# Patient Record
Sex: Female | Born: 1955 | Race: White | Hispanic: No | Marital: Married | Smoking: Never smoker
Health system: Southern US, Community
[De-identification: ages and names within clinical notes are randomized; demographics above are authoritative.]

## PROBLEM LIST (undated history)

## (undated) DIAGNOSIS — M1990 Unspecified osteoarthritis, unspecified site: Secondary | ICD-10-CM

## (undated) DIAGNOSIS — F419 Anxiety disorder, unspecified: Secondary | ICD-10-CM

## (undated) DIAGNOSIS — E039 Hypothyroidism, unspecified: Secondary | ICD-10-CM

## (undated) DIAGNOSIS — C73 Malignant neoplasm of thyroid gland: Secondary | ICD-10-CM

## (undated) DIAGNOSIS — R011 Cardiac murmur, unspecified: Secondary | ICD-10-CM

## (undated) HISTORY — PX: ABDOMINAL HYSTERECTOMY: SHX81

## (undated) HISTORY — PX: REDUCTION MAMMAPLASTY: SUR839

## (undated) HISTORY — PX: APPENDECTOMY: SHX54

## (undated) HISTORY — PX: TONSILLECTOMY: SUR1361

## (undated) HISTORY — PX: CHOLECYSTECTOMY: SHX55

---

## 1991-07-12 HISTORY — PX: TOTAL THYROIDECTOMY: SHX2547

## 1999-04-22 ENCOUNTER — Other Ambulatory Visit: Admission: RE | Admit: 1999-04-22 | Discharge: 1999-04-22 | Payer: Self-pay | Admitting: Gynecology

## 1999-10-01 ENCOUNTER — Encounter (INDEPENDENT_AMBULATORY_CARE_PROVIDER_SITE_OTHER): Payer: Self-pay | Admitting: Specialist

## 1999-10-01 ENCOUNTER — Other Ambulatory Visit: Admission: RE | Admit: 1999-10-01 | Discharge: 1999-10-01 | Payer: Self-pay | Admitting: Gynecology

## 2001-01-09 ENCOUNTER — Other Ambulatory Visit: Admission: RE | Admit: 2001-01-09 | Discharge: 2001-01-09 | Payer: Self-pay | Admitting: Gynecology

## 2002-02-05 ENCOUNTER — Other Ambulatory Visit: Admission: RE | Admit: 2002-02-05 | Discharge: 2002-02-05 | Payer: Self-pay | Admitting: Gynecology

## 2004-11-05 LAB — HM DEXA SCAN

## 2005-03-02 ENCOUNTER — Ambulatory Visit: Payer: Self-pay | Admitting: Chiropractic Medicine

## 2005-09-22 ENCOUNTER — Ambulatory Visit: Payer: Self-pay | Admitting: Dermatology

## 2005-12-12 LAB — HM PAP SMEAR: HM PAP: NEGATIVE

## 2006-05-05 ENCOUNTER — Ambulatory Visit: Payer: Self-pay | Admitting: Endocrinology

## 2006-12-11 ENCOUNTER — Encounter (HOSPITAL_COMMUNITY): Admission: RE | Admit: 2006-12-11 | Discharge: 2006-12-15 | Payer: Self-pay | Admitting: Endocrinology

## 2007-03-02 ENCOUNTER — Ambulatory Visit (HOSPITAL_COMMUNITY): Admission: RE | Admit: 2007-03-02 | Discharge: 2007-03-02 | Payer: Self-pay | Admitting: Family Medicine

## 2007-04-24 ENCOUNTER — Encounter: Payer: Self-pay | Admitting: *Deleted

## 2007-04-24 DIAGNOSIS — Z9079 Acquired absence of other genital organ(s): Secondary | ICD-10-CM | POA: Insufficient documentation

## 2007-04-24 DIAGNOSIS — F411 Generalized anxiety disorder: Secondary | ICD-10-CM | POA: Insufficient documentation

## 2007-04-24 DIAGNOSIS — D649 Anemia, unspecified: Secondary | ICD-10-CM

## 2007-04-24 DIAGNOSIS — D72819 Decreased white blood cell count, unspecified: Secondary | ICD-10-CM | POA: Insufficient documentation

## 2007-04-24 DIAGNOSIS — Z9089 Acquired absence of other organs: Secondary | ICD-10-CM

## 2007-04-24 DIAGNOSIS — Z872 Personal history of diseases of the skin and subcutaneous tissue: Secondary | ICD-10-CM | POA: Insufficient documentation

## 2007-04-24 DIAGNOSIS — Z85850 Personal history of malignant neoplasm of thyroid: Secondary | ICD-10-CM

## 2009-03-03 ENCOUNTER — Ambulatory Visit: Payer: Self-pay | Admitting: Internal Medicine

## 2009-03-18 ENCOUNTER — Ambulatory Visit: Payer: Self-pay | Admitting: Internal Medicine

## 2009-03-18 LAB — HM COLONOSCOPY

## 2009-05-19 ENCOUNTER — Emergency Department: Payer: Self-pay | Admitting: Unknown Physician Specialty

## 2010-11-26 NOTE — Consult Note (Signed)
[facility]                            ENDOCRINOLOGY CONSULTATION   Emily Arellano, Emily Arellano                       MRN:          [id number]  DATE:05/05/2006                            DOB:          1955/07/28    REASON FOR VISIT:  Check thyroid.   HISTORY OF PRESENT ILLNESS:  A 55 year old woman with a history of what she  calls mixed capillary and follicular thyroid cancer in 1994.  She states she  had adjunctive therapy with iodine-131.  She states there has been no recent  evidence of any recurrence.   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   She states that in her 46 thyroid surgery, 2 parathyroids were removed.   She was also found in January 2007 to have idiopathic urticaria and she  finds that only Dapsone helps this.  She has gained 15 pounds in the past 3  months and has a few arthralgias as well as some constipation.  She states  the only thing that helps her constipation is Dulcolax or MiraLax.   PAST MEDICAL HISTORY:  Anxiety.   MEDICATIONS:  Synthroid compound hormone, Dapsone, Zantac, Diclofenac,  Zoloft, Singulair, Doxepin, and Xanax.  She also takes p.r.n. Atarax.   SOCIAL HISTORY:  She is married.  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   FAMILY HISTORY:  Her mother has a history of psoriasis.  There is no history  of thyroid disease in her family.   REVIEW OF SYSTEMS:  Denies the following:  Fever, chest pain, shortness of  breath, hematuria, rectal bleeding and numbness.   PHYSICAL EXAMINATION:  Blood pressure 104/70, heart rate is 69, temperature  is 97.6, weight is 158.  GENERAL:  No distress.  Skin:  Normal texture and temperature.  I don't see  a rash.  HEENT:  No proptosis, no periorbital swelling.  Pharynx: No erythema.  NECK:  She has a healed surgical scar.  No palpable thyroid tissue.  EXTREMITIES:  No edema.  LYMPH NODES:   None palpable at the neck, nor at the supraclavicular area.  MUSCULOSKELETAL:  Gait is observed in the office to be normal.  NEUROLOGIC:  Alert and oriented, does not appear anxious or depressed, and  sensation is diffusely intact to touch.   LABORATORY STUDIES:  On April 05, 2006:  WBC 3,200, hemoglobin 10.6,  platelets 269.  Liver function tests normal except for GOT of 40.  TSH:  0.17.   IMPRESSION:  1. 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  2. Minimally elevated GOT.  3. In a patient with a very low risk of recurrence of thyroid cancer, she      would be considered over supplemented with her Synthroid.  If there is      any evidence or risk of recurrence, this would be appropriate      supplementation.  4. History of parathyroidectomy of 2 parathyroids during her 1994 surgery.  5. History of thyroid cancer for which she has apparently not recently  been checked.  6. Mild leukopenia and anemia.   PLAN:  1. I have told her that continuing the Dapsone is fine with me.  2. Please make an appointment with Dr. Sullivan Lone to address the abnormal CBC      and abnormal GOT.  3. Check parathyroid hormone level, thyroglobulin and anti-thyroglobulin      antibody.  4. Okay to return here p.r.n.    ______________________________  Cleophas Dunker. Everardo All, MD    SAE/MedQ  DD: 05/07/2006  DT: 05/08/2006  Job #: [id number]   cc:   Julieanne Manson

## 2011-09-21 DIAGNOSIS — M66879 Spontaneous rupture of other tendons, unspecified ankle and foot: Secondary | ICD-10-CM | POA: Insufficient documentation

## 2011-09-21 DIAGNOSIS — M2140 Flat foot [pes planus] (acquired), unspecified foot: Secondary | ICD-10-CM | POA: Insufficient documentation

## 2011-10-10 HISTORY — PX: TENDON REPAIR: SHX5111

## 2012-02-06 ENCOUNTER — Encounter (HOSPITAL_COMMUNITY): Payer: Self-pay | Admitting: Pharmacy Technician

## 2012-02-13 ENCOUNTER — Other Ambulatory Visit: Payer: Self-pay | Admitting: Orthopedic Surgery

## 2012-02-16 ENCOUNTER — Encounter (HOSPITAL_COMMUNITY)
Admission: RE | Admit: 2012-02-16 | Discharge: 2012-02-16 | Disposition: A | Discharge status: Home / Self Care | Payer: BC Managed Care – PPO | Source: Ambulatory Visit | Attending: Orthopedic Surgery | Admitting: Orthopedic Surgery

## 2012-02-16 ENCOUNTER — Encounter (HOSPITAL_COMMUNITY): Payer: Self-pay

## 2012-02-16 HISTORY — DX: Cardiac murmur, unspecified: R01.1

## 2012-02-16 HISTORY — DX: Hypothyroidism, unspecified: E03.9

## 2012-02-16 LAB — CBC
MCH: 28.8 pg (ref 26.0–34.0)
MCHC: 32.9 g/dL (ref 30.0–36.0)
Platelets: 231 10*3/uL (ref 150–400)
RBC: 4.55 MIL/uL (ref 3.87–5.11)

## 2012-02-16 LAB — BASIC METABOLIC PANEL
BUN: 13 mg/dL (ref 6–23)
Calcium: 8.8 mg/dL (ref 8.4–10.5)
GFR calc non Af Amer: 77 mL/min — ABNORMAL LOW (ref >90)
Glucose, Bld: 99 mg/dL (ref 70–99)
Sodium: 139 mEq/L (ref 135–145)

## 2012-02-16 LAB — SURGICAL PCR SCREEN: MRSA, PCR: NEGATIVE

## 2012-02-16 NOTE — Pre-Procedure Instructions (Signed)
20 Emily Arellano  02/16/2012   Your procedure is scheduled on:  Thursday August 15 ,2013 at 1300 PM  Report to [facility] at 1100 AM.  Call this number if you have problems the morning of surgery: [phone]   Remember:   Do not eat food or drink:After Midnight.      Take these medicines the morning of surgery with A SIP OF WATER: Xanax[ Alprazolam] , Synthroid   Do not wear jewelry, make-up or nail polish.  Do not wear lotions, powders, or perfumes. You may wear deodorant.  Do not shave 48 hours prior to surgery.  Do not bring valuables to the hospital.  Contacts, dentures or bridgework may not be worn into surgery.  Leave suitcase in the car. After surgery it may be brought to your room.  For patients admitted to the hospital, checkout time is 11:00 AM the day of discharge.   Patients discharged the day of surgery will not be allowed to drive home.    Special Instructions: CHG Shower Use Special Wash: 1/2 bottle night before surgery and 1/2 bottle morning of surgery.   Please read over the following fact sheets that you were given: Pain Booklet, Coughing and Deep Breathing, MRSA Information and Surgical Site Infection Prevention

## 2012-02-17 ENCOUNTER — Other Ambulatory Visit: Payer: Self-pay | Admitting: Orthopedic Surgery

## 2012-02-17 ENCOUNTER — Ambulatory Visit
Admission: RE | Admit: 2012-02-17 | Discharge: 2012-02-17 | Disposition: A | Discharge status: Home / Self Care | Payer: BC Managed Care – PPO | Source: Ambulatory Visit | Attending: Orthopedic Surgery | Admitting: Orthopedic Surgery

## 2012-02-17 DIAGNOSIS — R911 Solitary pulmonary nodule: Secondary | ICD-10-CM

## 2012-02-17 DIAGNOSIS — S42309A Unspecified fracture of shaft of humerus, unspecified arm, initial encounter for closed fracture: Secondary | ICD-10-CM

## 2012-02-17 NOTE — Consult Note (Signed)
Anesthesia chart review: Patient is a 56 year old female scheduled for ORIF left humerus nonunion with iliac crest and bone morphogenic protein bone graft and fixation as necessary, left radial nerve exploration and decompression as needed on 02/23/12 by Dr. Amanda Pea.  History includes thyroid cancer s/p thyroidectomy and iodine- 131 therapy '94 with secondary hypothyroidism, murmur (not specified), OA, tonsillectomy, hysterectomy, cholecystectomy, foot surgery  CXR on 02/16/12 showed: 1. No acute cardiopulmonary disease.  2. Nodular density at the medial right lung base on the frontal  view measuring about 15 mm. Pulmonary nodule is not excluded and follow-up noncontrast chest CT recommended.  3. Nonunion left humerus. She subsequently had a CT scan without contrast on 02/17/12 that showed: 1. No acute cardiopulmonary abnormalities.  2. No suspicious nodule or mass noted.   Labs acceptable.  EKG on 02/16/12 showed NSR.  She will be evaluated by her Anesthesiologist on the day of surgery.  If no worrisome cardiopulmonary exam findings or CV/CHF symptoms then anticipate she can proceed as planned.   Shonna Chock, PA-C

## 2012-02-22 MED ORDER — CEFAZOLIN SODIUM-DEXTROSE 2-3 GM-% IV SOLR
2.0000 g | INTRAVENOUS | Status: AC
  Administered 2012-02-23: 2 g via INTRAVENOUS
  Filled 2012-02-22: qty 50

## 2012-02-22 MED ORDER — LACTATED RINGERS IV SOLN: INTRAVENOUS | Status: DC

## 2012-02-22 MED ORDER — CHLORHEXIDINE GLUCONATE 4 % EX LIQD: 60.0000 mL | Freq: Once | CUTANEOUS | Status: DC

## 2012-02-22 NOTE — Progress Notes (Signed)
Second request made to office for orders. Dr. Biagio Quint out of town will not return until Friday morning.

## 2012-02-23 ENCOUNTER — Encounter (HOSPITAL_COMMUNITY): Payer: Self-pay | Admitting: Vascular Surgery

## 2012-02-23 ENCOUNTER — Encounter (HOSPITAL_COMMUNITY)
Admission: RE | Disposition: A | Discharge status: Home / Self Care | Payer: Self-pay | Source: Ambulatory Visit | Attending: Orthopedic Surgery

## 2012-02-23 ENCOUNTER — Observation Stay (HOSPITAL_COMMUNITY)
Admission: RE | Admit: 2012-02-23 | Discharge: 2012-02-25 | DRG: 219 | Disposition: A | Discharge status: Home / Self Care | Payer: BC Managed Care – PPO | Source: Ambulatory Visit | Attending: Orthopedic Surgery | Admitting: Orthopedic Surgery

## 2012-02-23 ENCOUNTER — Encounter (HOSPITAL_COMMUNITY): Payer: Self-pay | Admitting: *Deleted

## 2012-02-23 ENCOUNTER — Ambulatory Visit (HOSPITAL_COMMUNITY): Payer: BC Managed Care – PPO | Admitting: Vascular Surgery

## 2012-02-23 DIAGNOSIS — D649 Anemia, unspecified: Secondary | ICD-10-CM

## 2012-02-23 DIAGNOSIS — Z01810 Encounter for preprocedural cardiovascular examination: Secondary | ICD-10-CM | POA: Insufficient documentation

## 2012-02-23 DIAGNOSIS — Z01818 Encounter for other preprocedural examination: Secondary | ICD-10-CM | POA: Insufficient documentation

## 2012-02-23 DIAGNOSIS — Z01812 Encounter for preprocedural laboratory examination: Secondary | ICD-10-CM | POA: Insufficient documentation

## 2012-02-23 DIAGNOSIS — E039 Hypothyroidism, unspecified: Secondary | ICD-10-CM | POA: Insufficient documentation

## 2012-02-23 DIAGNOSIS — IMO0002 Reserved for concepts with insufficient information to code with codable children: Principal | ICD-10-CM | POA: Insufficient documentation

## 2012-02-23 HISTORY — PX: ORIF HUMERUS FRACTURE: SHX2126

## 2012-02-23 HISTORY — DX: Anxiety disorder, unspecified: F41.9

## 2012-02-23 HISTORY — PX: HARVEST BONE GRAFT: SHX377

## 2012-02-23 HISTORY — DX: Unspecified osteoarthritis, unspecified site: M19.90

## 2012-02-23 HISTORY — DX: Malignant neoplasm of thyroid gland: C73

## 2012-02-23 SURGERY — OPEN REDUCTION INTERNAL FIXATION (ORIF) DISTAL HUMERUS FRACTURE
Anesthesia: General | Site: Hip | Wound class: Clean

## 2012-02-23 MED ORDER — FENTANYL CITRATE 0.05 MG/ML IJ SOLN
INTRAMUSCULAR | Status: DC | PRN
  Administered 2012-02-23 (×3): 50 ug via INTRAVENOUS
  Administered 2012-02-23: 100 ug via INTRAVENOUS

## 2012-02-23 MED ORDER — BUPIVACAINE-EPINEPHRINE PF 0.5-1:200000 % IJ SOLN
INTRAMUSCULAR | Status: DC | PRN
  Administered 2012-02-23: 25 mL

## 2012-02-23 MED ORDER — LEVOTHYROXINE SODIUM 88 MCG PO TABS
88.0000 ug | ORAL_TABLET | Freq: Every day | ORAL | Status: DC
  Administered 2012-02-23 – 2012-02-25 (×3): 88 ug via ORAL
  Filled 2012-02-23 (×3): qty 1

## 2012-02-23 MED ORDER — PROMETHAZINE HCL 25 MG RE SUPP: 12.5000 mg | Freq: Four times a day (QID) | RECTAL | Status: DC | PRN

## 2012-02-23 MED ORDER — ALPRAZOLAM 0.5 MG PO TABS
1.0000 mg | ORAL_TABLET | Freq: Every day | ORAL | Status: DC
  Administered 2012-02-24: 1 mg via ORAL
  Filled 2012-02-23: qty 2

## 2012-02-23 MED ORDER — FAMOTIDINE 20 MG PO TABS
20.0000 mg | ORAL_TABLET | Freq: Two times a day (BID) | ORAL | Status: DC | PRN
Start: 2012-02-23 — End: 2012-02-25
  Filled 2012-02-23: qty 1

## 2012-02-23 MED ORDER — LACTATED RINGERS IV SOLN
INTRAVENOUS | Status: DC
  Administered 2012-02-23 – 2012-02-24 (×2): via INTRAVENOUS

## 2012-02-23 MED ORDER — VITAMIN C 500 MG PO TABS
1000.0000 mg | ORAL_TABLET | Freq: Every day | ORAL | Status: DC
  Administered 2012-02-23 – 2012-02-25 (×3): 1000 mg via ORAL
  Filled 2012-02-23 (×3): qty 2

## 2012-02-23 MED ORDER — ONDANSETRON HCL 4 MG PO TABS: 4.0000 mg | ORAL_TABLET | Freq: Four times a day (QID) | ORAL | Status: DC | PRN

## 2012-02-23 MED ORDER — HYDROMORPHONE HCL PF 1 MG/ML IJ SOLN
INTRAMUSCULAR | Status: AC
  Administered 2012-02-23: 0.5 mg via INTRAVENOUS
  Filled 2012-02-23: qty 1

## 2012-02-23 MED ORDER — PROPOFOL 10 MG/ML IV EMUL
INTRAVENOUS | Status: DC | PRN
  Administered 2012-02-23: 160 mg via INTRAVENOUS

## 2012-02-23 MED ORDER — DOCUSATE SODIUM 100 MG PO CAPS
100.0000 mg | ORAL_CAPSULE | Freq: Two times a day (BID) | ORAL | Status: DC
  Administered 2012-02-23 – 2012-02-25 (×4): 100 mg via ORAL
  Filled 2012-02-23 (×6): qty 1

## 2012-02-23 MED ORDER — HYDROMORPHONE HCL PF 1 MG/ML IJ SOLN
0.2500 mg | INTRAMUSCULAR | Status: DC | PRN
  Administered 2012-02-23 (×3): 0.5 mg via INTRAVENOUS

## 2012-02-23 MED ORDER — DEXAMETHASONE SODIUM PHOSPHATE 4 MG/ML IJ SOLN
INTRAMUSCULAR | Status: DC | PRN
  Administered 2012-02-23: 4 mg

## 2012-02-23 MED ORDER — LACTATED RINGERS IV SOLN
INTRAVENOUS | Status: DC | PRN
  Administered 2012-02-23 (×2): via INTRAVENOUS

## 2012-02-23 MED ORDER — EPINEPHRINE HCL 1 MG/ML IJ SOLN
INTRAMUSCULAR | Status: AC
  Filled 2012-02-23: qty 2

## 2012-02-23 MED ORDER — MIDAZOLAM HCL 5 MG/5ML IJ SOLN
INTRAMUSCULAR | Status: DC | PRN
  Administered 2012-02-23: 2 mg via INTRAVENOUS

## 2012-02-23 MED ORDER — DIPHENHYDRAMINE HCL 25 MG PO CAPS
25.0000 mg | ORAL_CAPSULE | Freq: Four times a day (QID) | ORAL | Status: DC | PRN
  Administered 2012-02-23 – 2012-02-24 (×2): 50 mg via ORAL
  Filled 2012-02-23: qty 2
  Filled 2012-02-23 (×2): qty 1

## 2012-02-23 MED ORDER — BUPIVACAINE HCL 0.25 % IJ SOLN
INTRAMUSCULAR | Status: DC | PRN
  Administered 2012-02-23: 10 mL

## 2012-02-23 MED ORDER — EPINEPHRINE HCL 1 MG/ML IJ SOLN
INTRAMUSCULAR | Status: DC | PRN
  Administered 2012-02-23: 1 mg

## 2012-02-23 MED ORDER — METHOCARBAMOL 100 MG/ML IJ SOLN
500.0000 mg | Freq: Four times a day (QID) | INTRAVENOUS | Status: DC | PRN
  Filled 2012-02-23: qty 5

## 2012-02-23 MED ORDER — ONDANSETRON HCL 4 MG/2ML IJ SOLN
INTRAMUSCULAR | Status: DC | PRN
  Administered 2012-02-23: 4 mg via INTRAVENOUS

## 2012-02-23 MED ORDER — ONDANSETRON HCL 4 MG/2ML IJ SOLN: 4.0000 mg | Freq: Four times a day (QID) | INTRAMUSCULAR | Status: DC | PRN

## 2012-02-23 MED ORDER — GLYCOPYRROLATE 0.2 MG/ML IJ SOLN
INTRAMUSCULAR | Status: DC | PRN
  Administered 2012-02-23: .5 mg via INTRAVENOUS

## 2012-02-23 MED ORDER — METHOCARBAMOL 500 MG PO TABS
500.0000 mg | ORAL_TABLET | Freq: Four times a day (QID) | ORAL | Status: DC | PRN
  Administered 2012-02-23 – 2012-02-25 (×5): 500 mg via ORAL
  Filled 2012-02-23 (×6): qty 1

## 2012-02-23 MED ORDER — ARTIFICIAL TEARS OP OINT
TOPICAL_OINTMENT | OPHTHALMIC | Status: DC | PRN
  Administered 2012-02-23: 1 via OPHTHALMIC

## 2012-02-23 MED ORDER — MIDAZOLAM HCL 2 MG/2ML IJ SOLN: 1.0000 mg | INTRAMUSCULAR | Status: DC | PRN

## 2012-02-23 MED ORDER — LIDOCAINE HCL (CARDIAC) 20 MG/ML IV SOLN
INTRAVENOUS | Status: DC | PRN
  Administered 2012-02-23: 80 mg via INTRAVENOUS

## 2012-02-23 MED ORDER — HEMOSTATIC AGENTS (NO CHARGE) OPTIME
TOPICAL | Status: DC | PRN
  Administered 2012-02-23: 1 via TOPICAL

## 2012-02-23 MED ORDER — FENTANYL CITRATE 0.05 MG/ML IJ SOLN: 50.0000 ug | INTRAMUSCULAR | Status: DC | PRN

## 2012-02-23 MED ORDER — ROCURONIUM BROMIDE 100 MG/10ML IV SOLN
INTRAVENOUS | Status: DC | PRN
  Administered 2012-02-23: 50 mg via INTRAVENOUS

## 2012-02-23 MED ORDER — TAPENTADOL HCL 50 MG PO TABS
50.0000 mg | ORAL_TABLET | ORAL | Status: DC | PRN
  Administered 2012-02-24 – 2012-02-25 (×6): 50 mg via ORAL
  Filled 2012-02-23 (×6): qty 1

## 2012-02-23 MED ORDER — ADULT MULTIVITAMIN W/MINERALS CH
1.0000 | ORAL_TABLET | Freq: Every day | ORAL | Status: DC
  Administered 2012-02-23 – 2012-02-25 (×3): 1 via ORAL
  Filled 2012-02-23 (×3): qty 1

## 2012-02-23 MED ORDER — BUPIVACAINE HCL (PF) 0.25 % IJ SOLN
INTRAMUSCULAR | Status: AC
  Filled 2012-02-23: qty 30

## 2012-02-23 MED ORDER — PROMETHAZINE HCL 25 MG/ML IJ SOLN: 6.2500 mg | INTRAMUSCULAR | Status: DC | PRN

## 2012-02-23 MED ORDER — NEOSTIGMINE METHYLSULFATE 1 MG/ML IJ SOLN
INTRAMUSCULAR | Status: DC | PRN
  Administered 2012-02-23: 3 mg via INTRAVENOUS

## 2012-02-23 MED ORDER — HYDROMORPHONE HCL PF 1 MG/ML IJ SOLN
0.5000 mg | INTRAMUSCULAR | Status: DC | PRN
  Administered 2012-02-23 – 2012-02-24 (×4): 1 mg via INTRAVENOUS
  Filled 2012-02-23 (×4): qty 1

## 2012-02-23 SURGICAL SUPPLY — 59 items
BANDAGE ELASTIC 3 VELCRO ST LF (GAUZE/BANDAGES/DRESSINGS) ×1 IMPLANT
BANDAGE ELASTIC 4 VELCRO ST LF (GAUZE/BANDAGES/DRESSINGS) ×3 IMPLANT
BANDAGE GAUZE ELAST BULKY 4 IN (GAUZE/BANDAGES/DRESSINGS) IMPLANT
BIT DRILL 3.2 QC DISP (BIT) ×1 IMPLANT
BIT DRILL 3.8X127 CALB (DRILL) ×1 IMPLANT
BNDG CMPR 9X4 STRL LF SNTH (GAUZE/BANDAGES/DRESSINGS) ×2
BNDG ESMARK 4X9 LF (GAUZE/BANDAGES/DRESSINGS) ×3 IMPLANT
CLOTH BEACON ORANGE TIMEOUT ST (SAFETY) ×3 IMPLANT
CLSR STERI-STRIP ANTIMIC 1/2X4 (GAUZE/BANDAGES/DRESSINGS) ×2 IMPLANT
CORDS BIPOLAR (ELECTRODE) ×3 IMPLANT
COVER MAYO STAND STRL (DRAPES) ×3 IMPLANT
COVER SURGICAL LIGHT HANDLE (MISCELLANEOUS) ×3 IMPLANT
CUFF TOURNIQUET SINGLE 18IN (TOURNIQUET CUFF) ×3 IMPLANT
CUFF TOURNIQUET SINGLE 24IN (TOURNIQUET CUFF) IMPLANT
DRAPE INCISE IOBAN 66X45 STRL (DRAPES) ×3 IMPLANT
DRAPE OEC MINIVIEW 54X84 (DRAPES) ×1 IMPLANT
DRSG MEPILEX BORDER 4X12 (GAUZE/BANDAGES/DRESSINGS) ×1 IMPLANT
DRSG MEPILEX BORDER 4X8 (GAUZE/BANDAGES/DRESSINGS) ×1 IMPLANT
GAUZE XEROFORM 1X8 LF (GAUZE/BANDAGES/DRESSINGS) IMPLANT
GLOVE BIOGEL M STRL SZ7.5 (GLOVE) ×3 IMPLANT
GLOVE SS BIOGEL STRL SZ 8 (GLOVE) ×2 IMPLANT
GLOVE SUPERSENSE BIOGEL SZ 8 (GLOVE) ×1
GOWN PREVENTION PLUS XLARGE (GOWN DISPOSABLE) ×2 IMPLANT
GOWN STRL NON-REIN LRG LVL3 (GOWN DISPOSABLE) ×6 IMPLANT
GOWN STRL REIN XL XLG (GOWN DISPOSABLE) ×8 IMPLANT
IMPLANT OP-1 (Orthopedic Implant) ×1 IMPLANT
KIT BASIN OR (CUSTOM PROCEDURE TRAY) ×3 IMPLANT
KIT ROOM TURNOVER OR (KITS) ×3 IMPLANT
MANIFOLD NEPTUNE II (INSTRUMENTS) ×3 IMPLANT
NDL HYPO 25GX1X1/2 BEV (NEEDLE) IMPLANT
NEEDLE HYPO 25GX1X1/2 BEV (NEEDLE) ×3 IMPLANT
NS IRRIG 1000ML POUR BTL (IV SOLUTION) ×3 IMPLANT
PACK ORTHO EXTREMITY (CUSTOM PROCEDURE TRAY) ×3 IMPLANT
PAD ARMBOARD 7.5X6 YLW CONV (MISCELLANEOUS) ×6 IMPLANT
PAD CAST 3X4 CTTN HI CHSV (CAST SUPPLIES) IMPLANT
PAD CAST 4YDX4 CTTN HI CHSV (CAST SUPPLIES) IMPLANT
PADDING CAST COTTON 3X4 STRL (CAST SUPPLIES) ×3
PADDING CAST COTTON 4X4 STRL (CAST SUPPLIES) ×3
PLATE LOCK COMP 8H 4.5 (Plate) ×1 IMPLANT
SCREW LOCK CORT HEX 4.5X18 (Screw) ×1 IMPLANT
SCREW LOCK CORT HEX 4.5X22 (Screw) ×2 IMPLANT
SCREW LOCK CORT HEX 4.5X24 (Screw) ×1 IMPLANT
SCREW NLOCK CORT 4.5X26 (Screw) ×2 IMPLANT
SOLUTION BETADINE 4OZ (MISCELLANEOUS) ×3 IMPLANT
SPLINT FIBERGLASS 4X30 (CAST SUPPLIES) ×1 IMPLANT
SPONGE GAUZE 4X4 12PLY (GAUZE/BANDAGES/DRESSINGS) IMPLANT
SPONGE LAP 18X18 X RAY DECT (DISPOSABLE) ×2 IMPLANT
SPONGE SCRUB IODOPHOR (GAUZE/BANDAGES/DRESSINGS) ×3 IMPLANT
SUCTION FRAZIER TIP 10 FR DISP (SUCTIONS) ×1 IMPLANT
SUT MERSILENE 4 0 P 3 (SUTURE) IMPLANT
SUT PROLENE 4 0 PS 2 18 (SUTURE) ×1 IMPLANT
SUT VIC AB 2-0 CT1 27 (SUTURE) ×3
SUT VIC AB 2-0 CT1 TAPERPNT 27 (SUTURE) IMPLANT
SYR CONTROL 10ML LL (SYRINGE) ×2 IMPLANT
TOWEL OR 17X24 6PK STRL BLUE (TOWEL DISPOSABLE) ×3 IMPLANT
TOWEL OR 17X26 10 PK STRL BLUE (TOWEL DISPOSABLE) ×6 IMPLANT
TUBE CONNECTING 12X1/4 (SUCTIONS) IMPLANT
UNDERPAD 30X30 INCONTINENT (UNDERPADS AND DIAPERS) ×3 IMPLANT
WATER STERILE IRR 1000ML POUR (IV SOLUTION) ×3 IMPLANT

## 2012-02-23 NOTE — Preoperative (Signed)
Beta Blockers   Reason not to administer Beta Blockers:Not Applicable. No home beta blockers 

## 2012-02-23 NOTE — Anesthesia Postprocedure Evaluation (Signed)
  Anesthesia Post-op Note  Patient: Emily Arellano  Procedure(s) Performed: Procedure(s) (LRB): OPEN REDUCTION INTERNAL FIXATION (ORIF) DISTAL HUMERUS FRACTURE (Left) HARVEST ILIAC BONE GRAFT (N/A)  Patient Location: PACU  Anesthesia Type: GA combined with regional for post-op pain  Level of Consciousness: awake and alert   Airway and Oxygen Therapy: Patient Spontanous Breathing  Post-op Pain: none  Post-op Assessment: Post-op Vital signs reviewed, Patient's Cardiovascular Status Stable, Respiratory Function Stable, Patent Airway, No signs of Nausea or vomiting and Pain level controlled  Post-op Vital Signs: stable  Complications: No apparent anesthesia complications

## 2012-02-23 NOTE — Transfer of Care (Signed)
Immediate Anesthesia Transfer of Care Note  Patient: Emily Arellano  Procedure(s) Performed: Procedure(s) (LRB): OPEN REDUCTION INTERNAL FIXATION (ORIF) DISTAL HUMERUS FRACTURE (Left) HARVEST ILIAC BONE GRAFT (N/A)  Patient Location: PACU  Anesthesia Type: GA combined with regional for post-op pain  Level of Consciousness: awake, alert  and oriented  Airway & Oxygen Therapy: Patient Spontanous Breathing and Patient connected to nasal cannula oxygen  Post-op Assessment: Report given to PACU RN and Post -op Vital signs reviewed and stable  Post vital signs: Reviewed and stable  Complications: No apparent anesthesia complications

## 2012-02-23 NOTE — Anesthesia Preprocedure Evaluation (Addendum)
Anesthesia Evaluation  Patient identified by MRN, date of birth, ID band Patient awake    Reviewed: Allergy & Precautions, H&P , NPO status , Patient's Chart, lab work & pertinent test results, reviewed documented beta blocker date and time   Airway Mallampati: I TM Distance: >3 FB Neck ROM: Full    Dental  (+) Teeth Intact and Dental Advisory Given   Pulmonary    Pulmonary exam normal       Cardiovascular     Neuro/Psych Anxiety    GI/Hepatic   Endo/Other  Hypothyroidism   Renal/GU      Musculoskeletal   Abdominal   Peds  Hematology   Anesthesia Other Findings   Reproductive/Obstetrics                          Anesthesia Physical Anesthesia Plan  ASA: II  Anesthesia Plan: General   Post-op Pain Management:    Induction: Intravenous  Airway Management Planned: Oral ETT  Additional Equipment:   Intra-op Plan:   Post-operative Plan: Extubation in OR  Informed Consent: I have reviewed the patients History and Physical, chart, labs and discussed the procedure including the risks, benefits and alternatives for the proposed anesthesia with the patient or authorized representative who has indicated his/her understanding and acceptance.   Dental advisory given  Plan Discussed with: CRNA and Surgeon  Anesthesia Plan Comments:        Anesthesia Quick Evaluation

## 2012-02-23 NOTE — Progress Notes (Signed)
02/23/12 2300-Pt having difficulty voiding, bladder scan showed urine retention. In and out cath done as ordered PRN , drained clear yellow urine. 02/23/12 2330-Hemovac was accidentally pulled out from pts, L hip, no active drainage noted. Dressing dry and intact, will continue to monitor.

## 2012-02-23 NOTE — Anesthesia Procedure Notes (Addendum)
Anesthesia Regional Block:  Interscalene brachial plexus block  Pre-Anesthetic Checklist: ,, timeout performed, Correct Patient, Correct Site, Correct Laterality, Correct Procedure, Correct Position, site marked, Risks and benefits discussed,  Surgical consent,  Pre-op evaluation,  At surgeon's request and post-op pain management  Laterality: Left  Prep: chloraprep       Needles:  Injection technique: Single-shot  Needle Type: Stimulator Needle - 40      Needle Gauge: 22 and 22 G    Additional Needles:  Procedures: nerve stimulator Interscalene brachial plexus block  Nerve Stimulator or Paresthesia:  Response: 0.48 mA,   Additional Responses:   Narrative:  Start time: 02/23/2012 12:16 PM End time: 02/23/2012 12:33 PM Injection made incrementally with aspirations every 5 mL. Anesthesiologist: Dr Gypsy Balsam  Additional Notes: [id number] L ISB POP #22 stim needle w/stim down to .48ma Multiple neg asp Marc .5% w/epi 1:200000 total 25cc+decadron 4mg  No compl Dr Gypsy Balsam     Procedure Name: Intubation Date/Time: 02/23/2012 12:51 PM Performed by: Margaree Mackintosh Pre-anesthesia Checklist: Patient identified, Timeout performed, Emergency Drugs available, Suction available and Patient being monitored Patient Re-evaluated:Patient Re-evaluated prior to inductionOxygen Delivery Method: Circle system utilized Preoxygenation: Pre-oxygenation with 100% oxygen Intubation Type: IV induction Ventilation: Mask ventilation without difficulty Laryngoscope Size: Mac and 3 Grade View: Grade I Tube type: Oral Tube size: 7.5 mm Number of attempts: 1 Airway Equipment and Method: Stylet Placement Confirmation: ETT inserted through vocal cords under direct vision,  positive ETCO2 and breath sounds checked- equal and bilateral Secured at: 20 cm Tube secured with: Tape Dental Injury: Teeth and Oropharynx as per pre-operative assessment

## 2012-02-23 NOTE — Op Note (Signed)
See dictation # [id number] Oletta Cohn MD

## 2012-02-23 NOTE — H&P (Signed)
Emily Arellano is an 56 y.o. female.   Chief Complaint: Left humerus nonunion HPI: Pt. presents for evaluation and treatment surgically of her left humerus nonunion. She understands all issues. Marland Kitchen.Patient presents for evaluation and treatment of the of their upper extremity predicament. The patient denies neck back chest or of abdominal pain. The patient notes that they have no lower extremity problems. The patient from primarily complains of the upper extremity pain noted.  She feels ready for the surgery she understands all risk and benefits and desires to proceed.  Past Medical History  Diagnosis Date  . Heart murmur   . Hypothyroidism   . Arthritis     Past Surgical History  Procedure Date  . Abdominal hysterectomy   . Total thyroidectomy 1993     and        radiation  . Foot surgery October 18, 2011    NWB x2 weeks  . Tonsillectomy   . Cholecystectomy     History reviewed. No pertinent family history. Social History:  reports that she has never smoked. She has never used smokeless tobacco. She reports that she does not drink alcohol or use illicit drugs.  Allergies:  Allergies  Allergen Reactions  . Hydrocodone     Possible reaction    itching  . Sulfamethoxazole W-Trimethoprim     REACTION: Hives    Medications Prior to Admission  Medication Sig Dispense Refill  . ALPRAZolam (XANAX) 1 MG tablet Take 1-1.5 mg by mouth at bedtime.      . Calcium 150 MG TABS Take 2 tablets by mouth at bedtime.      Marland Kitchen ibuprofen (ADVIL,MOTRIN) 200 MG tablet Take 600 mg by mouth every 6 (six) hours as needed. For pain      . levothyroxine (SYNTHROID, LEVOTHROID) 88 MCG tablet Take 88 mcg by mouth daily.      Marland Kitchen MAGNESIUM PO Take 3-4 tablets by mouth at bedtime.      Marland Kitchen POTASSIUM PO Take 1 tablet by mouth at bedtime. OTC potassium        No results found for this or any previous visit (from the past 48 hour(s)). No results found.  Review of Systems  Constitutional: Negative.   Eyes:  Negative.   Respiratory: Negative.   Cardiovascular: Negative.   Gastrointestinal: Negative.   Genitourinary: Negative.   Psychiatric/Behavioral: Negative.     Blood pressure 126/84, pulse 71, temperature 97.9 F (36.6 C), temperature source Oral, resp. rate 20, SpO2 98.00%. Physical Exam  ..The patient is alert and oriented in no acute distress the patient complains of pain in the affected upper extremity. The patient is noted to have a normal HEENT exam. Lung fields show equal chest expansion and no shortness of breath abdomen exam is nontender without distention. Lower extremity examination does not show any fracture dislocation or blood clot symptoms. Pelvis is stable neck and back are stable and nontender   Assessment/Plan .Marland KitchenWe are planning surgery for your upper extremity. The risk and benefits of surgery include risk of bleeding infection anesthesia damage to normal structures and failure of the surgery to accomplish its intended goals of relieving symptoms and restoring function with this in mind we'll going to proceed. I have specifically discussed with the patient the pre-and postoperative regime and the does and don'ts and risk and benefits in great detail. Risk and benefits of surgery also include risk of dystrophy chronic nerve pain failure of the healing process to go onto completion and other inherent risks  of surgery The relavent the pathophysiology of the disease/injury process, as well as the alternatives for treatment and postoperative course of action has been discussed in great detail with the patient who desires to proceed.  We will do everything in our power to help you (the patient) restore function to the upper extremity. Is a pleasure to see this patient today.   Karen Chafe 02/23/2012, 12:24 PM

## 2012-02-24 ENCOUNTER — Encounter (HOSPITAL_COMMUNITY): Payer: Self-pay | Admitting: Orthopedic Surgery

## 2012-02-24 MED ORDER — BETHANECHOL CHLORIDE 5 MG PO TABS
5.0000 mg | ORAL_TABLET | Freq: Three times a day (TID) | ORAL | Status: DC
  Administered 2012-02-25: 5 mg via ORAL
  Filled 2012-02-24 (×4): qty 1

## 2012-02-24 NOTE — Progress Notes (Signed)
Subjective: 1 Day Post-Op Procedure(s) (LRB): OPEN REDUCTION INTERNAL FIXATION (ORIF) DISTAL HUMERUS FRACTURE (Left) HARVEST ILIAC BONE GRAFT (N/A) Patient is doing fairly well, her pain is controlled.   She reports difficulty sleeping last night and states she had difficulties voiding and had I & O cath. She questions if she should be cathed again. She does not describe urgency or current sensation to need to void. Patient concerned with "hives" which are chronic in nature and per her description are related to anxiety. This is currently being treated outpt by dermatology. She denies fever, chills, nausea, sob or cp. Objective: Vital signs in last 24 hours: Temp:  [98.4 F (36.9 C)-98.9 F (37.2 C)] 98.4 F (36.9 C) (08/16 1410) Pulse Rate:  [60-113] 113  (08/16 1410) Resp:  [18-20] 20  (08/16 1410) BP: (125-146)/(78-86) 146/78 mmHg (08/16 1410) SpO2:  [97 %-98 %] 98 % (08/16 1410)  Intake/Output from previous day: 08/15 0701 - 08/16 0700 In: 1400 [I.V.:1400] Out: 1550 [Urine:1300; Blood:250] Intake/Output this shift:    No results found for this basename: HGB:5 in the last 72 hours No results found for this basename: WBC:2,RBC:2,HCT:2,PLT:2 in the last 72 hours No results found for this basename: NA:2,K:2,CL:2,CO2:2,BUN:2,CREATININE:2,GLUCOSE:2,CALCIUM:2 in the last 72 hours No results found for this basename: LABPT:2,INR:2 in the last 72 hours  Pleasant NAD Head atraumatic Chest ctab abd nt LUE: splint clean and dry, digital rom intact, radial nerve intact no signs of infection Hip: incision clean and dry, drain removed  Assessment/Plan: 1 Day Post-Op Procedure(s) (LRB): OPEN REDUCTION INTERNAL FIXATION (ORIF) DISTAL HUMERUS FRACTURE (Left) HARVEST ILIAC BONE GRAFT (N/A) Plan for ambulation today, ot and pt, cont pain control and iv abx and tentatively plan for dc tomorrow, discussed with patient beginning urinary retention agent.  Emily Arellano L 02/24/2012, 10:29  PM

## 2012-02-24 NOTE — Evaluation (Signed)
Occupational Therapy Evaluation Patient Details Name: Emily Arellano MRN: [id number] DOB: [birth year] Today's Date: 02/24/2012 Time: 1191-4782 OT Time Calculation (min): 63 min  OT Assessment / Plan / Recommendation Clinical Impression  This 56 y.o. female admitted for ORIF with bone grafting of nonunion humeral fracture.  Pt. is very anxious and reports she is having a difficult time emotionally due to nature of original injury.  Pt. requires increased time and repetition to feel confident with positioning and ADLs.  Husband is very supportive.  Pain 4 -5 /10 during eval    OT Assessment  Patient needs continued OT Services    Follow Up Recommendations  Supervision - Intermittent    Barriers to Discharge None    Equipment Recommendations  None recommended by OT    Recommendations for Other Services    Frequency  Min 2X/week    Precautions / Restrictions Precautions Precautions: Other (comment) (ROM hand;  elbow, wrist and forearm immobilized) Required Braces or Orthoses: Other Brace/Splint Other Brace/Splint: Post surgical posterior elbow splint with bulky dressing and elbow in 90* flexion Restrictions Weight Bearing Restrictions: Yes LUE Weight Bearing: Non weight bearing Other Position/Activity Restrictions: WBing status not in chart, but pt. post op day 1 humerus ORIF       ADL  Eating/Feeding: Set up;Performed Where Assessed - Eating/Feeding: Edge of bed Grooming: Performed;Wash/dry hands;Supervision/safety Where Assessed - Grooming: Unsupported standing Upper Body Bathing: Simulated;Moderate assistance Where Assessed - Upper Body Bathing: Unsupported sitting Lower Body Bathing: Simulated;Minimal assistance Where Assessed - Lower Body Bathing: Unsupported sit to stand Upper Body Dressing: Performed;Moderate assistance (gown) Where Assessed - Upper Body Dressing: Unsupported sit to stand Lower Body Dressing: Simulated;Minimal assistance;Performed Where Assessed -  Lower Body Dressing: Unsupported sit to stand Toilet Transfer: Performed;Supervision/safety Toilet Transfer Method: Stand pivot Acupuncturist: Materials engineer and Hygiene: Performed;Minimal assistance Where Assessed - Engineer, mining and Hygiene: Standing Equipment Used: Upper extremity splints Transfers/Ambulation Related to ADLs: Pt. transferring to [facility] with supervision.  Required min A to ambulate to door and just into hallway ADL Comments: Pt. and spouse report that they recall how to perform ADLs  from last fall when pt. was in long arm cast.  They were able to verbalize safe method for donning/doffing shirt, and how to cover Lt UE for shower - they have a cast cover at home.  Discussed precautions.  Pt. was instructed in edema control, bed positioning, and how to don/doff sling.  she was able to return demonstration of all, but is very anxious and tentative - requires reassurance.  Pt. also instructed in neck ROM exercises as she is complaining of neck pain due to bed positioning.       OT Diagnosis: Generalized weakness;Acute pain  OT Problem List: Decreased knowledge of precautions;Pain;Impaired UE functional use;Decreased strength;Decreased range of motion OT Treatment Interventions: Self-care/ADL training;DME and/or AE instruction;Patient/family education   OT Goals Acute Rehab OT Goals OT Goal Formulation: With patient/family Time For Goal Achievement: 02/27/12 Potential to Achieve Goals: Good ADL Goals Pt Will Perform Grooming: with modified independence;Standing at sink ADL Goal: Grooming - Progress: Goal set today Pt Will Transfer to Toilet: with modified independence;Ambulation;Comfort height toilet ADL Goal: Toilet Transfer - Progress: Progressing toward goals Pt Will Perform Toileting - Clothing Manipulation: with modified independence;Standing ADL Goal: Toileting - Clothing Manipulation - Progress: Goal set  today Additional ADL Goal #1: Pt/spouse will be independent with how to safely perform UB and LB ADLs ADL Goal: Additional Goal #  1 - Progress: Goal set today Additional ADL Goal #2: Pt/spouse will be independent with positioning Lt. UE including elevation for edema control ADL Goal: Additional Goal #2 - Progress: Goal set today Arm Goals Additional Arm Goal #1: Pt/spouse will be independent with sling wear and care Arm Goal: Additional Goal #1 - Progress: Goal set today Additional Arm Goal #2: Pt will be indepdent with AROM neck and hand Arm Goal: Additional Goal #2 - Progress: Goal set today  Visit Information  Last OT Received On: 02/24/12 Assistance Needed: +1    Subjective Data  Subjective: "I'm having a hard time emotionally" Patient Stated Goal: To have a normal arm   Prior Functioning  Vision/Perception  Home Living Lives With: Spouse Available Help at Discharge: Family;Available 24 hours/day Type of Home: House Bathroom Shower/Tub: Walk-in shower Bathroom Toilet: Handicapped height Bathroom Accessibility: Yes How Accessible: Accessible via walker Home Adaptive Equipment: Shower chair with back;Hospital bed Additional Comments: Husband and family very supportive Prior Function Level of Independence: Independent Able to Take Stairs?: Yes Driving: Yes Communication Communication: No difficulties Dominant Hand: Right      Cognition  Overall Cognitive Status: Appears within functional limits for tasks assessed/performed Arousal/Alertness: Awake/alert Orientation Level: Appears intact for tasks assessed Behavior During Session: Anxious    Extremity/Trunk Assessment Right Upper Extremity Assessment RUE ROM/Strength/Tone: Within functional levels RUE Coordination: WFL - gross/fine motor Left Upper Extremity Assessment LUE ROM/Strength/Tone: Deficits LUE ROM/Strength/Tone Deficits: s/p Lt. humeral ORIF.  Pt. in post op posterior elbow splint with wrist and forearm  immobilized LUE Coordination: Deficits LUE Coordination Deficits: post op cast limits motion and coordination.   Trunk Assessment Trunk Assessment: Normal   Mobility Bed Mobility Bed Mobility: Sit to Supine;Supine to Sit Supine to Sit: 5: Supervision;HOB elevated;With rails Sit to Supine: 5: Supervision;With rail;HOB elevated Details for Bed Mobility Assistance: Pt. anxious about bed mobility and positioning to avoid pain in Lt. UE and in neck.  Pt. instructed how to use bed to her advantage to reduce strain on neck since she has hospital bed at home Transfers Transfers: Sit to Stand;Stand to Sit Sit to Stand: 5: Supervision;With upper extremity assist;From bed;From chair/3-in-1 Stand to Sit: 5: Supervision;With upper extremity assist;To chair/3-in-1;To bed Details for Transfer Assistance: Pt. requires increased time and encouragement that she is performing correctly   Exercise Shoulder Exercises Neck Flexion: AROM;10 reps;Seated Neck Extension: AROM;10 reps;Seated Neck Lateral Flexion - Right: AROM;10 reps;Seated Neck Lateral Flexion - Left: AROM;10 reps;Seated Hand Exercises Digit Composite Flexion: AROM;Left;10 reps;Seated Composite Extension: AROM;10 reps;Seated  Balance Balance Balance Assessed: Yes Dynamic Standing Balance Dynamic Standing - Level of Assistance: 5: Stand by assistance Dynamic Standing - Balance Activities: Other (comment) (ADLs)  End of Session OT - End of Session Activity Tolerance: Patient tolerated treatment well Patient left: in bed;with call bell/phone within reach;with family/visitor present Nurse Communication: Patient requests pain meds  GO     Sherena Machorro, Ursula Alert M 02/24/2012, 8:20 PM

## 2012-02-24 NOTE — Progress Notes (Signed)
Occupational Therapy Progress Note   02/24/12 2000  OT Visit Information  Last OT Received On 02/24/12  Assistance Needed +1  OT Time Calculation  OT Start Time 1525  OT Stop Time 1539  OT Time Calculation (min) 14 min  Precautions  Precautions Other (comment) (ROM hand;  elbow, wrist and forearm immobilized)  Required Braces or Orthoses Other Brace/Splint  Other Brace/Splint Post surgical posterior elbow splint with bulky dressing and elbow in 90* flexion  Restrictions  Weight Bearing Restrictions Yes  LUE Weight Bearing NWB  Other Position/Activity Restrictions WBing status not in chart, but pt. post op day 1 humerus ORIF  ADL  Toilet Transfer Performed;Supervision/safety  Toilet Transfer Method Stand pivot  Musician - Set designer  Where Assessed - Toileting Clothing Manipulation and Hygiene Standing  Transfers/Ambulation Related to ADLs supervision  ADL Comments Pt. inquiring about proper bed position, and position of Lt. UE.  Re-instructed pt. on bed positioning, and position of Lt. UE while supine and sleeping.  Ice applied to Lt. UE  Cognition  Overall Cognitive Status Appears within functional limits for tasks assessed/performed  Arousal/Alertness Awake/alert  Orientation Level Appears intact for tasks assessed  Behavior During Session Anxious  Bed Mobility  Bed Mobility Sit to Supine;Supine to Sit  Supine to Sit With rails;HOB elevated;6: Modified independent (Device/Increase time)  Sit to Supine 6: Modified independent (Device/Increase time);With rail;HOB elevated  Transfers  Transfers Sit to Stand;Stand to Sit  Sit to Stand 6: Modified independent (Device/Increase time);From bed;With upper extremity assist  Stand to Sit 6: Modified independent (Device/Increase time);With upper extremity assist;To chair/3-in-1  Shoulder Exercises  Neck Flexion AROM;10 reps;Seated  Neck  Extension AROM;10 reps;Seated  Neck Lateral Flexion - Right AROM;10 reps;Seated  Neck Lateral Flexion - Left AROM;10 reps;Seated  OT - End of Session  Activity Tolerance Patient tolerated treatment well  Patient left with call bell/phone within reach;with family/visitor present (On BSC awaiting PT with husband present)  Nurse Communication Patient requests pain meds  OT Assessment/Plan  Comments on Treatment Session Pt less anxious this pm, but continues to need reinforcement due to anxiety  OT Plan Discharge plan remains appropriate  OT Frequency Min 2X/week  Follow Up Recommendations Supervision - Intermittent  Equipment Recommended None recommended by OT  ADL Goals  ADL Goal: Toilet Transfer - Progress Progressing toward goals  ADL Goal: Toileting - Clothing Manipulation - Progress Progressing toward goals  ADL Goal: Additional Goal #2 - Progress Progressing toward goals     Jeani Hawking, OTR/L [phone]

## 2012-02-24 NOTE — Op Note (Signed)
NAMEJEANINE, Arellano NO.:  [id number]  MEDICAL RECORD NO.:  [id number]  LOCATION:  [id number]                        FACILITY:  [facility]  PHYSICIAN:  Dionne Ano. Jeffory Snelgrove, M.D.DATE OF BIRTH:  08/16/1955  DATE OF PROCEDURE:  02/23/2012 DATE OF DISCHARGE:                              OPERATIVE REPORT   PREOPERATIVE DIAGNOSIS:  Well established left humerus nonunion.  POSTOPERATIVE DIAGNOSIS:  Well established left humerus nonunion.  PROCEDURES: 1. Left humerus nonunion open reduction and internal fixation with     left hip iliac crest bone graft and a Biomet plate and screw of the     4.5 LCDC variety with associated BMP bone graft.  This was an 8-     hole plate and screw construct with 8 cortices proximal and 6     cortices distal for fixation.  She had good sound stable fixation. 2. Stress radiography. 3. Limited radial nerve neurolysis.  SURGEON:  Dionne Ano. Amanda Pea, M.D.  ASSISTANT:  Karie Chimera, P.A.-C.  COMPLICATIONS:  None.  ANESTHESIA:  General with preoperative block.  TOURNIQUET TIME:  0.  ESTIMATED BLOOD LOSS:  250 mL.  INDICATIONS FOR THE PROCEDURE:  This patient is a pleasant female who is 56 years of age.  She has a greater than a year and half to 2 year nonunion in her left humerus.  She recently underwent successful surgery by Dr. Victorino Dike with her leg and asked if there are options for her arm. She was kindly referred for care.  We discussed her the risks and benefits of a well-established nonunion in terms of trying to attempt repair.  I discussed the risk of bleeding, infection, anesthesia, damage to normal structures, and failure of surgery to accomplish its intended goals of relieving symptoms and restoring function.  I discussed with her the issues of failure of surgery to accomplish its goals and other issues in hand and the remainder of her upper extremity predicament. With this in mind, she desires to proceed.  PROCEDURE IN DETAIL:   The patient was seen in the holding area, taken to the operative arena.  Preoperative antibiotics were given.  She was prepped and draped in usual sterile fashion with Betadine scrub and paint x10 minutes about the left arm.  The hip was prepared with DuraPrep x2, separate DuraPrep preparations.  Sterile field was secured, arm band was placed about operative sites.  Following this, final time- out was called.  Pre and postop check list complete, antibiotics were in, SCD hose were placed about the lower extremities for DVT prophylaxis and following this, the patient then underwent an approach to the humerus.  An anterolateral approach to the humerus was performed without difficulty.  Biceps was identified and swept medially.  The brachialis was somewhat remnant over the fracture site/nonunion site with pseudoarthrosis present.  I split the brachialis in midline and very carefully teased it away.  Following this, I then performed placement of retractors gently.  Proximally and distally, I incised an area about the periosteum, opened up the pseudoarthrosis, and a large amount of joint fluid egressed.  Following this, I spent a great time preparing the bone.  I tried to preserve all  periosteal attachments yet had to remove the pseudoarthrosis capsule anteriorly and on the sides as well as limited in the back.  I did not overly aggressively operate the back due to the course of the radial nerve.  Following this, I shingled the bone, reestablished medullary canal flow with drill followed by curette.  Both proximally and distally, the 2 ends of the bone had excellent medullary blood flow.  I prepared the 2 ends of the bone nicely and had good cancellous bone obtained from the hip for bone grafting here.  Following this, I dissected around and identified the radial nerve distally with fingertip palpation and it appeared to be out of the way and thus limited neurolysis was  accomplished.  Following this, left hip was incised, dissection was carried down, and the patient had the iliac wing exposed followed by window (cortical window), taken and a cancellous bone graft harvested.  Following this, Gelfoam with thrombin, irrigation, and cortical window replacement followed by closure with 0-Vicryl, 3-0 Vicryl and a subcuticular Prolene with drain about the hip was accomplished.  The drain was placed for postop egression of fluid purposes and Sensorcaine with epi was placed in the wound for postop analgesia.  Following this, cancellous bone graft was densely packed as well as BMP, which was prepared, these were densely packed in the site of nonunion and the 2 ends of the bones were then coapted under compression and an 8- hole plate applied in compression mode.  Cortical screws were placed followed by placement of locking screws according to standard AO technique.  The patient had excellent placement of the screws and good purchase.  There were no complicating features.  I was very pleased with the range of motion about the elbow, shoulder, and marked the bone very carefully so as to guide my alignment and her rotation.  After this was performed, x-rays were taken, these looked excellent.  I was pleased with this.  I placed additional bone graft around the site for fusion.  Following this, I then once again below the area of nonunion identified the radial nerve as it exited the lateral intermuscular septum.  This was done with palpation technique and facial nerve dissector.  I did not overly aggressively move around the back as I felt that the nerve was stable in this location out of harm's way, but will be mindful of this postoperatively and check this.  The patient tolerated this well.  I closed the interval of the brachialis with 2-0 Vicryl.  Subcu was closed with 3-0 Vicryl and the skin with subcuticular Prolene and Steri-Strips followed by Mepilex  and arm splint.  Long-arm splint was applied.  She tolerated the procedure well.  Compartments were soft.  The skin looked excellent and there was minimal bleeding after the ORIF.  The patient tolerated the procedure well.  There were no complicating features.  We are going to monitor her condition closely.  Monitor her in the recovery room and plan for observation overnight etc.  If any problems arise, we will certainly be available for her.  I have discussed with her all issues and relevant findings.  It was a pleasure to participate in her care.  We look forward to participate in her postop recovery.    Dionne Ano. Amanda Pea, M.D.    [facility]  D:  02/23/2012  T:  02/24/2012  Job:  [id number]

## 2012-02-24 NOTE — Progress Notes (Signed)
UR COMPLETED  

## 2012-02-24 NOTE — Evaluation (Signed)
Physical Therapy Evaluation and Discharge.  Patient Details Name: Emily Arellano MRN: [id number] DOB: 10/25/1955 Today's Date: 02/24/2012 Time: 7829-5621 PT Time Calculation (min): 17 min  PT Assessment / Plan / Recommendation Clinical Impression  Pt is a 56 y/o female s/p ORIF distal humerus fx (non-union).   Pt demonstrate independence with mobility and understanding of Shoulder post op precautions.  Acute PT signing off.     PT Assessment  Patent does not need any further PT services    Follow Up Recommendations  No PT follow up    Barriers to Discharge        Equipment Recommendations  None recommended by PT    Recommendations for Other Services     Frequency      Precautions / Restrictions Precautions Precautions: Shoulder Required Braces or Orthoses: Other Brace/Splint Restrictions Weight Bearing Restrictions: No   Pertinent Vitals/Pain Pt reports pain 5/10 in Left shoulder. Premedicated      Mobility  Bed Mobility Bed Mobility: Sit to Supine;Supine to Sit Supine to Sit: 7: Independent Sit to Supine: 7: Independent Transfers Transfers: Sit to Stand;Stand to Sit Sit to Stand: 7: Independent Stand to Sit: 7: Independent Ambulation/Gait Ambulation/Gait Assistance: 7: Independent Ambulation Distance (Feet): 150 Feet Assistive device: None Ambulation/Gait Assistance Details: mild antalgic gait from old injury to R LE.  Gait Pattern: Antalgic Stairs: No Wheelchair Mobility Wheelchair Mobility: No    Exercises     PT Diagnosis:    PT Problem List:   PT Treatment Interventions:     PT Goals Acute Rehab PT Goals PT Goal Formulation: With patient/family  Visit Information  Last PT Received On: 02/24/12 Assistance Needed: +1    Subjective Data  Subjective: Agree to PT eval   Prior Functioning  Home Living Lives With: Spouse Prior Function Level of Independence: Independent Able to Take Stairs?: Yes Communication Communication: No difficulties    Cognition  Overall Cognitive Status: Appears within functional limits for tasks assessed/performed Arousal/Alertness: Awake/alert Orientation Level: Appears intact for tasks assessed Behavior During Session: [facility] for tasks performed    Extremity/Trunk Assessment Right Lower Extremity Assessment RLE ROM/Strength/Tone: Deficits Left Lower Extremity Assessment LLE ROM/Strength/Tone: WFL for tasks assessed Trunk Assessment Trunk Assessment: Normal   Balance Balance Balance Assessed: No  End of Session PT - End of Session Equipment Utilized During Treatment: Gait belt;Other (comment) (Left shoulder immobilizer) Activity Tolerance: Patient tolerated treatment well Patient left: in chair;with call bell/phone within reach;with family/visitor present Nurse Communication: Mobility status  GP     Wendie Diskin 02/24/2012, 6:50 PM  Timber Lucarelli L. Vicent Febles DPT [phone]

## 2012-02-25 MED ORDER — TAPENTADOL HCL 50 MG PO TABS: 100.0000 mg | ORAL_TABLET | ORAL | Status: DC | PRN

## 2012-02-25 MED ORDER — METHOCARBAMOL 500 MG PO TABS: 500.0000 mg | ORAL_TABLET | Freq: Four times a day (QID) | ORAL | Status: AC | PRN

## 2012-02-25 NOTE — Discharge Summary (Signed)
Physician Discharge Summary  Patient ID: Emily Arellano MRN: [id number] DOB/AGE: [birth year] 55 y.o.  Admit date: 02/23/2012 Discharge date: 02/25/2012  Admission Diagnoses: Nonunion left humerus long-standing  Discharge Diagnoses: Status post ORIF nonunion left humerus with iliac crest bone graft Active Problems:  * No active hospital problems. *    Discharged Condition: good  Hospital Course: Patient did well throughout her hospital course she underwent surgery on Thursday Friday and Saturday recovered well at the time of discharge she was awake alert and oriented in no acute distress head normal bowel sounds leading well and was tolerating a regular diet and by mouth pain medicines.  She is able to be discharged home today she understands her instructions as I had detailed into her at great length.  Consults: None    Treatments: surgery: Status post ORIF nonunion left humerus C. op note.  Discharge Exam: Blood pressure 138/86, pulse 102, temperature 98.7 F (37.1 C), temperature source Oral, resp. rate 20, height 5\' 7"  (1.702 m), weight 75.4 kg (166 lb 3.6 oz), SpO2 100.00%. General appearance: alert, cooperative and appears stated age..The patient is alert and oriented in no acute distress the patient complains of pain in the affected upper extremity. The patient is noted to have a normal HEENT exam. Lung fields show equal chest expansion and no shortness of breath abdomen exam is nontender without distention. Lower extremity examination does not show any fracture dislocation or blood clot symptoms. Pelvis is stable neck and back are stable and nontender  Hip incision is clean dry and intact and has no complicating features. The patient's arm looks excellent and she remains neurovascularly intact with normal radial median and ulnar nerve function in the left upper extremity.  Disposition: Final discharge disposition not confirmed  Discharge Orders    Future Appointments:  Provider: Department: Dept Phone: Center:   04/18/2012 1:30 PM Sherlene Shams, MD [facility] [phone] None     Medication List  As of 02/25/2012 10:28 AM   ASK your doctor about these medications         ALPRAZolam 1 MG tablet   Commonly known as: XANAX   Take 1-1.5 mg by mouth at bedtime.      Calcium 150 MG Tabs   Take 2 tablets by mouth at bedtime.      ibuprofen 200 MG tablet   Commonly known as: ADVIL,MOTRIN   Take 600 mg by mouth every 6 (six) hours as needed. For pain      levothyroxine 88 MCG tablet   Commonly known as: SYNTHROID, LEVOTHROID   Take 88 mcg by mouth daily.      MAGNESIUM PO   Take 3-4 tablets by mouth at bedtime.      POTASSIUM PO   Take 1 tablet by mouth at bedtime. OTC potassium           Follow-up Information    Follow up with [facility] Higinio Plan, MD.   Contact information:   [address] [place] [address] [phone]          Signed: Karen Chafe 02/25/2012, 10:28 AM

## 2012-04-18 ENCOUNTER — Ambulatory Visit: Payer: Self-pay | Admitting: Internal Medicine

## 2013-07-07 IMAGING — CT CT CHEST W/O CM
2 of 4 series · 15 of 36 positions shown, 18 images · non-contrast
Comparison: 03/02/2007

CLINICAL DATA: Follow up lung nodule

CT CHEST WITHOUT CONTRAST
TECHNIQUE: Multidetector CT imaging of the chest was performed
following the standard protocol without IV contrast.

[Series 2: chest w/o · axial · non-contrast · 0.70mm/px · z∈[-170,+110]mm · 12 of 66 slices shown, 15 images]
[im 5/66  mediastinal]
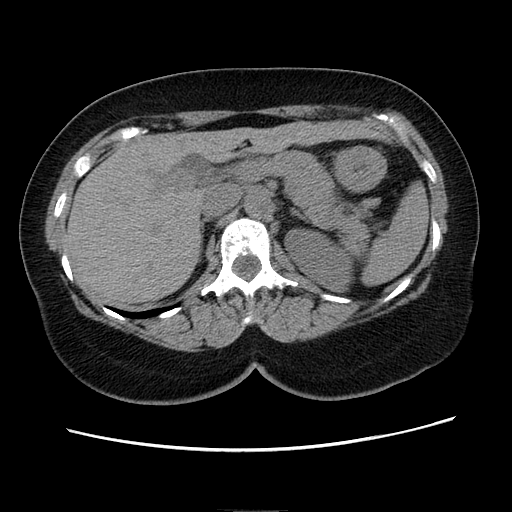
[im 5/66  lung]
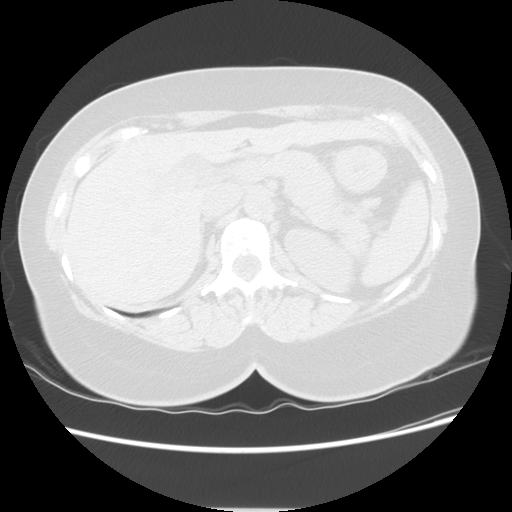
[im 10/66  lung]
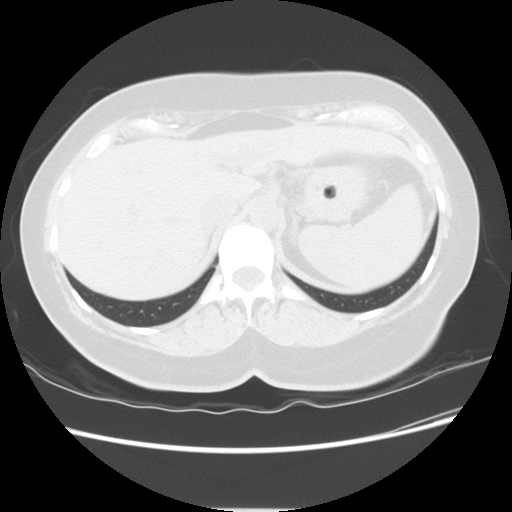
[im 14/66  lung]
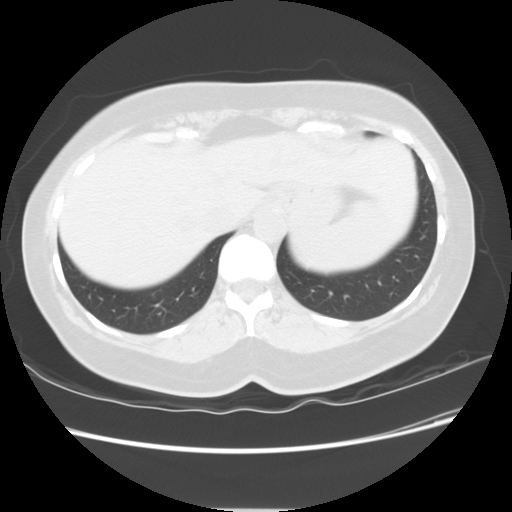
[im 19/66  lung]
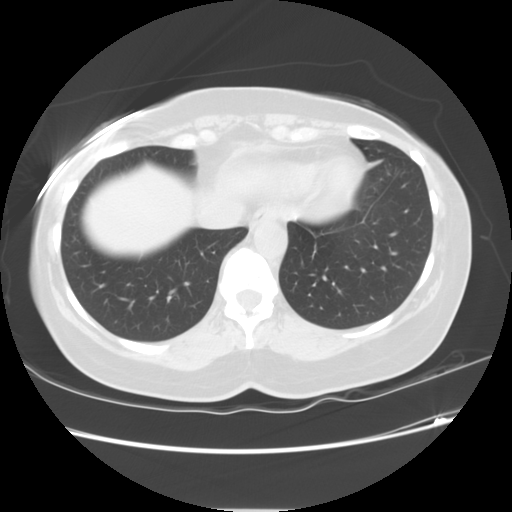
[im 24/66  mediastinal]
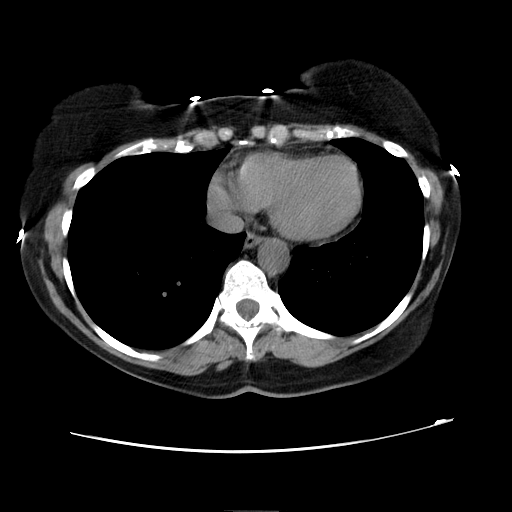
[im 24/66  lung]
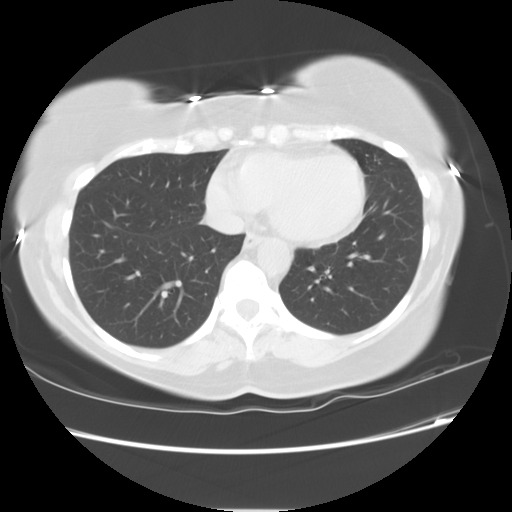
[im 28/66  lung]
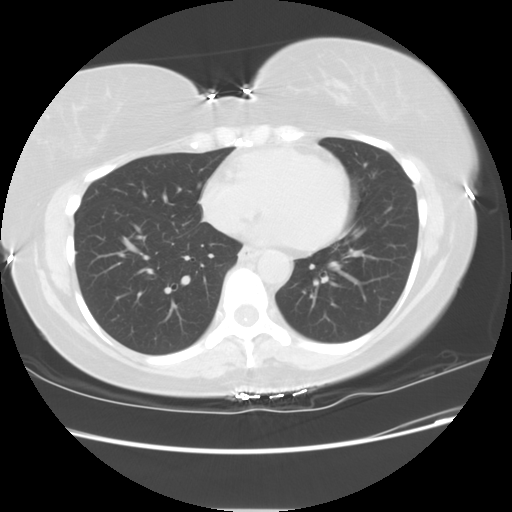
[im 38/66  lung]
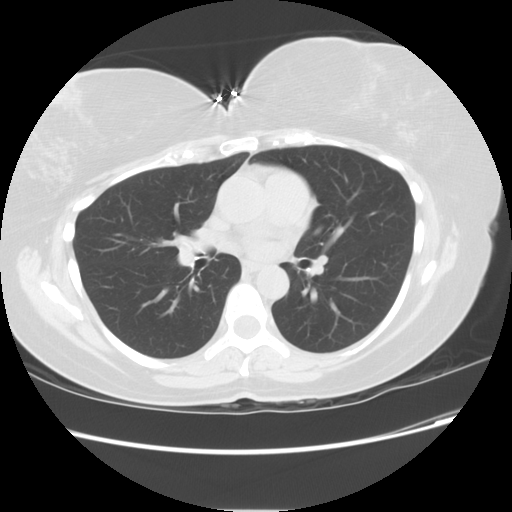
[im 42/66  lung]
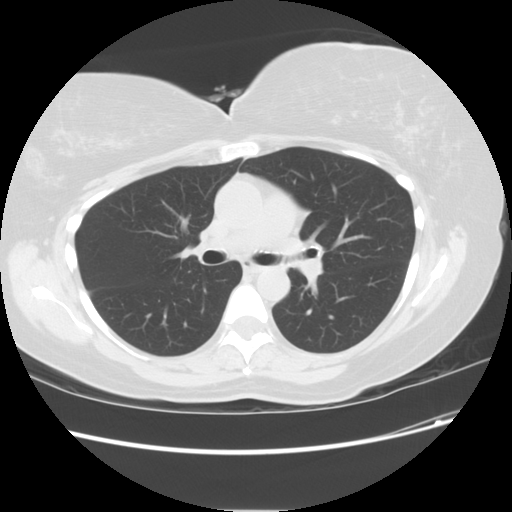
[im 47/66  mediastinal]
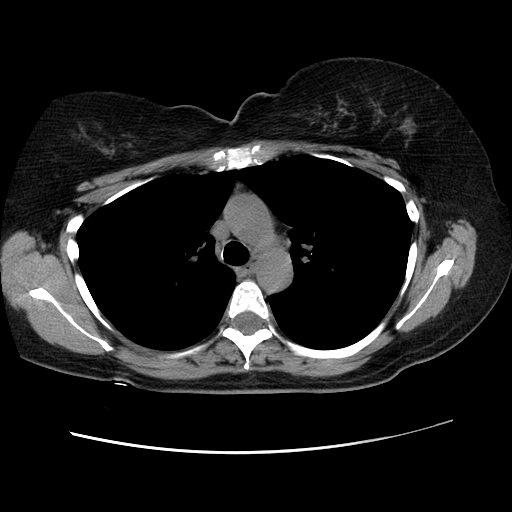
[im 47/66  lung]
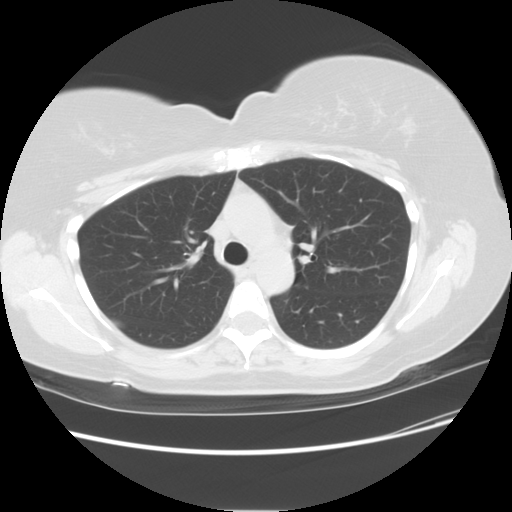
[im 52/66  lung]
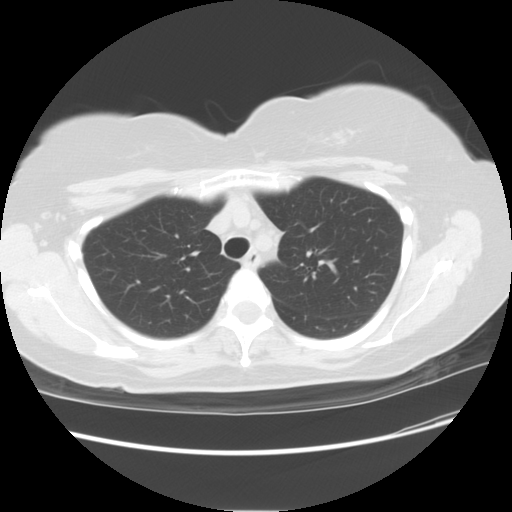
[im 56/66  lung]
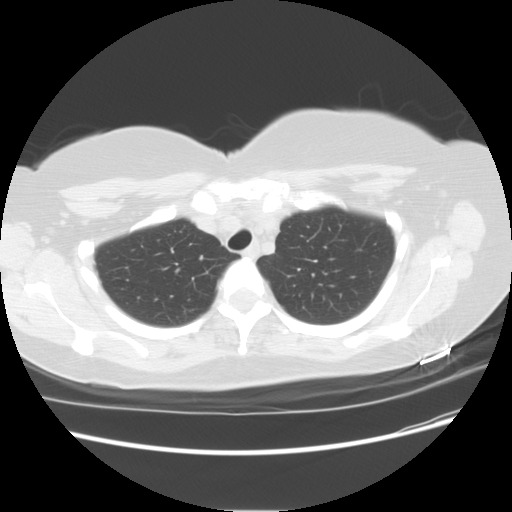
[im 61/66  lung]
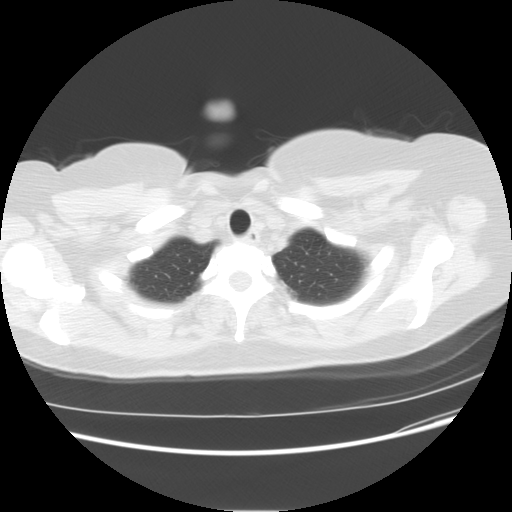

[Series 400: coronal · coronal · 0.70mm/px · 3 of 100 slices shown]
[im 20/100  lung]
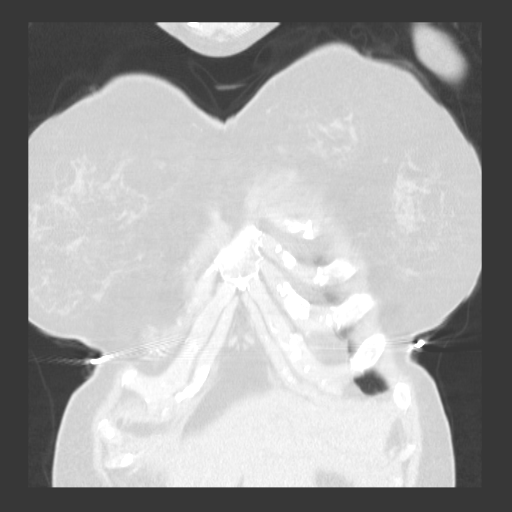
[im 40/100  lung]
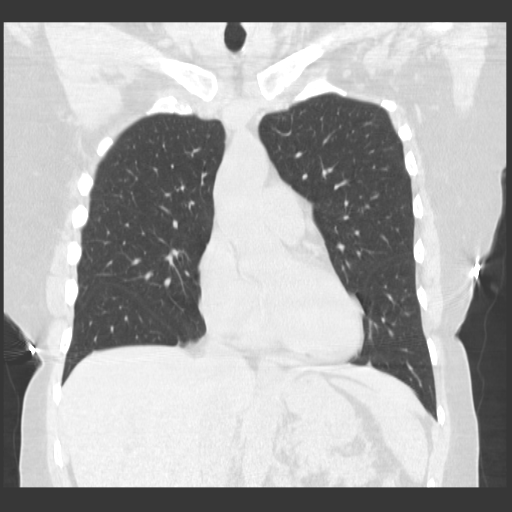
[im 60/100  lung]
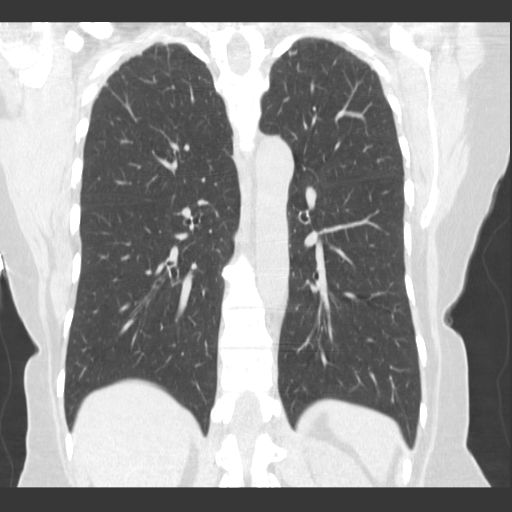

[15 of 36 positions shown; findings below may reference images not displayed]

FINDINGS: No axillary or supraclavicular adenopathy.  Prior thyroidectomy.
No mediastinal or hilar adenopathy.  No pericardial or pleural
effusion.  No airspace consolidation or atelectasis noted.  There
is no suspicious pulmonary nodule or mass noted.  Review of the
visualized osseous structures is significant for mild thoracic
spondylosis.  Limited imaging through the upper abdomen shows
cholecystectomy clips.
IMPRESSION: 1.  No acute cardiopulmonary abnormalities.
2.  No suspicious nodule or mass noted.

## 2014-04-05 ENCOUNTER — Encounter: Payer: Self-pay | Admitting: Internal Medicine

## 2014-04-11 LAB — TSH: TSH: 0.48 u[IU]/mL (ref 0.41–5.90)

## 2014-04-11 LAB — LIPID PANEL
CHOLESTEROL: 203 mg/dL — AB (ref 0–200)
HDL: 64 mg/dL (ref 35–70)
LDL Cholesterol: 118 mg/dL
LDL/HDL RATIO: 1.8
Triglycerides: 106 mg/dL (ref 40–160)

## 2014-04-11 LAB — CBC AND DIFFERENTIAL
HCT: 40 % (ref 36–46)
Hemoglobin: 13.2 g/dL (ref 12.0–16.0)
NEUTROS ABS: 2 /uL
Platelets: 308 10*3/uL (ref 150–399)
WBC: 3.7 10*3/mL

## 2014-04-11 LAB — BASIC METABOLIC PANEL
BUN: 11 mg/dL (ref 4–21)
Creatinine: 0.8 mg/dL (ref 0.5–1.1)
GLUCOSE: 87 mg/dL
Potassium: 4.5 mmol/L (ref 3.4–5.3)
SODIUM: 140 mmol/L (ref 137–147)

## 2014-04-11 LAB — HEPATIC FUNCTION PANEL
ALK PHOS: 70 U/L (ref 25–125)
ALT: 23 U/L (ref 7–35)
AST: 20 U/L (ref 13–35)
BILIRUBIN, TOTAL: 0.2 mg/dL

## 2014-07-11 HISTORY — PX: BREAST SURGERY: SHX581

## 2014-07-18 LAB — HM MAMMOGRAPHY

## 2015-01-27 DIAGNOSIS — E785 Hyperlipidemia, unspecified: Secondary | ICD-10-CM | POA: Insufficient documentation

## 2015-01-27 DIAGNOSIS — F411 Generalized anxiety disorder: Secondary | ICD-10-CM | POA: Insufficient documentation

## 2015-01-27 DIAGNOSIS — E669 Obesity, unspecified: Secondary | ICD-10-CM | POA: Insufficient documentation

## 2015-01-27 DIAGNOSIS — Z87898 Personal history of other specified conditions: Secondary | ICD-10-CM | POA: Insufficient documentation

## 2015-01-27 DIAGNOSIS — E559 Vitamin D deficiency, unspecified: Secondary | ICD-10-CM | POA: Insufficient documentation

## 2015-01-27 DIAGNOSIS — M8580 Other specified disorders of bone density and structure, unspecified site: Secondary | ICD-10-CM | POA: Insufficient documentation

## 2015-01-27 DIAGNOSIS — Z85850 Personal history of malignant neoplasm of thyroid: Secondary | ICD-10-CM | POA: Insufficient documentation

## 2015-01-27 DIAGNOSIS — M1990 Unspecified osteoarthritis, unspecified site: Secondary | ICD-10-CM | POA: Insufficient documentation

## 2015-01-27 DIAGNOSIS — E890 Postprocedural hypothyroidism: Secondary | ICD-10-CM | POA: Insufficient documentation

## 2015-01-27 DIAGNOSIS — E039 Hypothyroidism, unspecified: Secondary | ICD-10-CM

## 2015-01-27 DIAGNOSIS — N951 Menopausal and female climacteric states: Secondary | ICD-10-CM | POA: Insufficient documentation

## 2015-01-27 DIAGNOSIS — Z813 Family history of other psychoactive substance abuse and dependence: Secondary | ICD-10-CM | POA: Insufficient documentation

## 2015-01-27 DIAGNOSIS — J309 Allergic rhinitis, unspecified: Secondary | ICD-10-CM | POA: Insufficient documentation

## 2015-01-27 DIAGNOSIS — R7303 Prediabetes: Secondary | ICD-10-CM | POA: Insufficient documentation

## 2015-01-28 ENCOUNTER — Ambulatory Visit (INDEPENDENT_AMBULATORY_CARE_PROVIDER_SITE_OTHER): Payer: BLUE CROSS/BLUE SHIELD | Admitting: Family Medicine

## 2015-01-28 ENCOUNTER — Encounter: Payer: Self-pay | Admitting: Family Medicine

## 2015-01-28 VITALS — BP 124/68 | HR 84 | Resp 14 | Ht 66.25 in | Wt 171.0 lb

## 2015-01-28 DIAGNOSIS — Z0000 Encounter for general adult medical examination without abnormal findings: Secondary | ICD-10-CM | POA: Diagnosis not present

## 2015-01-28 DIAGNOSIS — R739 Hyperglycemia, unspecified: Secondary | ICD-10-CM | POA: Diagnosis not present

## 2015-01-28 DIAGNOSIS — Z2821 Immunization not carried out because of patient refusal: Secondary | ICD-10-CM

## 2015-01-28 DIAGNOSIS — Z124 Encounter for screening for malignant neoplasm of cervix: Secondary | ICD-10-CM | POA: Diagnosis not present

## 2015-01-28 LAB — POCT URINALYSIS DIPSTICK
Bilirubin, UA: NEGATIVE
Blood, UA: NEGATIVE
Glucose, UA: NEGATIVE
Ketones, UA: NEGATIVE
LEUKOCYTES UA: NEGATIVE
Nitrite, UA: NEGATIVE
PH UA: 6
PROTEIN UA: NEGATIVE
Spec Grav, UA: 1.015
Urobilinogen, UA: 0.2

## 2015-01-28 LAB — IFOBT (OCCULT BLOOD): IFOBT: NEGATIVE

## 2015-01-28 NOTE — Progress Notes (Signed)
Patient ID: Emily Arellano, female   DOB: 20-Nov-1955, 59 y.o.   MRN: [id number] Patient: Emily Arellano, Female    DOB: 1956-06-12, 59 y.o.   MRN: [id number] Visit Date: 01/28/2015  Today's Provider: Wilhemena Durie, MD   Chief Complaint  Patient presents with  . Annual Exam   Subjective:  Emily Arellano is a 59 y.o. female who presents today for health maintenance and complete physical. She feels well. She reports exercising none right now. She reports she is sleeping well.  LAST: CPE-04/26/13  EKG-05/18/09  Colonoscopy-03/18/09 repeat in 10 years  BMD-11/05/04.  Mammogram-07/18/14  Routine labs plus A1C and Vitamin D level-04/11/14  Pap smear-2007-had total hysterectomy due to heavy bleeding.  Patient decline immunizations. States she thinks she had tetanus shot about 3 years ago when she had surgery on her left arm. Patient declines having any immunizations.  Review of Systems  Constitutional: Negative.   HENT: Negative.   Eyes: Negative.   Respiratory: Negative.   Cardiovascular: Negative.   Gastrointestinal: Negative.   Endocrine: Negative.   Genitourinary: Negative.   Musculoskeletal: Positive for neck pain (has bone spurs) and neck stiffness.  Skin: Negative.   Allergic/Immunologic: Negative.   Neurological: Negative.   Hematological: Negative.   Psychiatric/Behavioral: Negative.    due to large breasts patient has chronic lower neck pain and some pain in the trapezius area and upper back. Her bra straps also digging into her shoulders and leave didn't and her shoulders just by the [facility] joint where the bra strap is. Mild chronic bilateral tinnitus without any dizziness or vertigo symptoms.  History   Social History  . Marital Status: Married    Spouse Name: Elta Guadeloupe  . Number of Children: 2  . Years of Education: 14   Occupational History  . [employer] and works for attorney    Social History Main Topics  . Smoking status: Never Smoker   . Smokeless tobacco:  Never Used  . Alcohol Use: No  . Drug Use: No  . Sexual Activity: Yes   Other Topics Concern  . Not on file   Social History Narrative    Patient Active Problem List   Diagnosis Date Noted  . Allergic rhinitis 01/27/2015  . Family history of drug addiction 01/27/2015  . Anxiety, generalized 01/27/2015  . H/O disease 01/27/2015  . History of prolonged Q-T interval on ECG 01/27/2015  . H/O malignant neoplasm of thyroid 01/27/2015  . HLD (hyperlipidemia) 01/27/2015  . Arthritis, degenerative 01/27/2015  . Adiposity 01/27/2015  . Osteopenia 01/27/2015  . Adult hypothyroidism 01/27/2015  . Post menopausal syndrome 01/27/2015  . Borderline diabetes 01/27/2015  . Avitaminosis D 01/27/2015  . Acquired flat foot 09/21/2011  . Nontraumatic rupture of posterior tibial tendon 09/21/2011  . ANEMIA 04/24/2007  . LEUKOPENIA, MILD 04/24/2007  . ANXIETY 04/24/2007  . THYROID CANCER, HX OF 04/24/2007  . URTICARIA, HX OF 04/24/2007  . TAH/BSO, HX OF 04/24/2007  . PARATHYROIDECTOMY 04/24/2007    Past Surgical History  Procedure Laterality Date  . Abdominal hysterectomy    . Total thyroidectomy  1993     and        radiation  . Tonsillectomy    . Orif humerus fracture  02/23/2012    Procedure: OPEN REDUCTION INTERNAL FIXATION (ORIF) DISTAL HUMERUS FRACTURE;  Surgeon: Roseanne Kaufman, MD;  Location: [place];  Service: Orthopedics;  Laterality: Left;  Open Reduction Internal Fixation Left Humerus Non Union  . Harvest bone graft  02/23/2012    Procedure: HARVEST ILIAC BONE GRAFT;  Surgeon: Roseanne Kaufman, MD;  Location: [place];  Service: Orthopedics;  Laterality: N/A;  With Iliac Crest and BMP Bone Graft Fixation As Necessary Radial Nerve Release (Neurolysis as Necessary)   . Tendon repair  10/2011    "posterior; left foot"  . Cholecystectomy  ~ 2005  . Appendectomy  ~ 2005    "I think; when they got my gallbladder"    Her family history includes Cancer in her father and sister; Dementia in  her mother; Diabetes in her mother; Hypertension in her mother.    Outpatient Prescriptions Prior to Visit  Medication Sig Dispense Refill  . amitriptyline (ELAVIL) 25 MG tablet Take by mouth.    . Calcium 150 MG TABS Take 2 tablets by mouth at bedtime.    . Cholecalciferol (VITAMIN D3) 2000 UNITS capsule Take by mouth.    . Cyanocobalamin (VITAMIN B12) 3000 MCG/ML LIQD Place under the tongue.    . fluticasone (FLONASE) 50 MCG/ACT nasal spray Place into the nose.    . ibuprofen (ADVIL,MOTRIN) 800 MG tablet Take by mouth.    . levothyroxine (SYNTHROID) 88 MCG tablet Take by mouth.    Marland Kitchen MAGNESIUM PO Take 3-4 tablets by mouth at bedtime.    . ALPRAZolam (XANAX) 1 MG tablet Take 1-1.5 mg by mouth at bedtime.    . Ascorbic Acid 1000 MG CHEW Chew by mouth.    . calcium carbonate (OS-CAL) 600 MG TABS tablet Take by mouth.    . Melatonin 10 MG TABS Take by mouth.    Marland Kitchen POTASSIUM PO Take 1 tablet by mouth at bedtime. OTC potassium    . tapentadol (NUCYNTA) 50 MG TABS Take 2 tablets (100 mg total) by mouth every 4 (four) hours as needed (take 1 to 2 tablets as needed  for pain). 60 tablet 0   No facility-administered medications prior to visit.    Patient Care Team: Jerrol Banana., MD as PCP - General (Unknown Physician Specialty)     Objective:   Vitals:  Filed Vitals:   01/28/15 1054  BP: 124/68  Pulse: 84  Resp: 14  Height: 5' 6.25" (1.683 m)  Weight: 171 lb (77.565 kg)    Physical Exam  Constitutional: She is oriented to person, place, and time. She appears well-developed and well-nourished.  HENT:  Head: Normocephalic.  Eyes: Conjunctivae are normal. Pupils are equal, round, and reactive to light.  Neck: Normal range of motion. Neck supple.  Cardiovascular: Normal rate, regular rhythm, normal heart sounds and intact distal pulses.   No murmur heard. Pulmonary/Chest: Effort normal and breath sounds normal. No respiratory distress. She has no wheezes. She has no rales.  She exhibits no tenderness.  Large breasts for patient's build. Dents present on both shoulders from bra straps.  Abdominal: Soft. Bowel sounds are normal. She exhibits no distension. There is no tenderness.  Genitourinary: Vagina normal. No vaginal discharge found.  Musculoskeletal: Normal range of motion. She exhibits no edema or tenderness.  Neurological: She is alert and oriented to person, place, and time.  Skin: Skin is warm and dry. No erythema.  Psychiatric: She has a normal mood and affect. Her behavior is normal. Judgment and thought content normal.     Depression Screen PHQ 2/9 Scores 01/28/2015  PHQ - 2 Score 0      Assessment & Plan:  1. Annual physical exam Up dated pap smear, labs today-pending results. Advised patient pap smear need  to be repeated in 5 years unless results abnormal. Discussed weight loss with patient-I think it will be beneficial for patient to loose about 10 lbs and she will feel better. Chalenged patient to loose 10 lbs before next visit by watching her habits and size portions. Will re check on the next visit, if no success will discuss starting medication.  - POCT Urinalysis Dipstick - CBC with Differential/Platelet - Lipid Panel With LDL/HDL Ratio - COMPLETE METABOLIC PANEL WITH GFR - TSH - IFOBT POC (occult bld, rslt in office)  2. Immunization not carried out because of patient refusal Patient declines. Need to try to get information from surgery 3 years ago with whether or not she had tetanus update. Probably needs Tdap.  3. Hyperglycemia Pending results. - HgB A1c  4. Pap smear for cervical cancer screening  - Pap IG w/ reflex to HPV when ASC-U  5. Large breasts Patient considering breast reductions-she does have dents in her shoulders from bra straps and some chronic low neck upper thoracic discomfort/pain that I think is attributable to the large breasts.. Follow.      Patient was seen and examined by Dr. Eulas Post and  note was scribed by Theressa Millard, RMA.

## 2015-01-31 LAB — PAP IG W/ RFLX HPV ASCU: PAP SMEAR COMMENT: 0

## 2015-02-02 ENCOUNTER — Telehealth: Payer: Self-pay | Admitting: Family Medicine

## 2015-02-02 NOTE — Telephone Encounter (Signed)
Pt is stating she may have breast reduction surgery and may need a EKG.  Pt is asking if she will be able to do this through our office or where she can have this done?  CB#[phone]/MW

## 2015-02-02 NOTE — Telephone Encounter (Signed)
We can just schedule her with [place] for this. Right?-aa

## 2015-02-03 NOTE — Telephone Encounter (Signed)
Pt reports that she is scheduled for an appt with her surgeon and she will ask then and make sure, but he had said that he did not think she needed it. Pt will call if she needs [place] to get her in for an EKG.

## 2015-02-03 NOTE — Telephone Encounter (Signed)
LMTCB  aa 

## 2015-02-03 NOTE — Telephone Encounter (Signed)
We can do it but that it may be redundant. A lot of the surgical places have their own preop protocols. She needs to check what their protocol lives.

## 2015-02-07 LAB — CMP14+EGFR
ALBUMIN: 4.5 g/dL (ref 3.5–5.5)
ALK PHOS: 80 IU/L (ref 39–117)
ALT: 19 IU/L (ref 0–32)
AST: 18 IU/L (ref 0–40)
Albumin/Globulin Ratio: 2 (ref 1.1–2.5)
BUN/Creatinine Ratio: 12 (ref 9–23)
BUN: 9 mg/dL (ref 6–24)
CALCIUM: 8.7 mg/dL (ref 8.7–10.2)
CO2: 24 mmol/L (ref 18–29)
CREATININE: 0.73 mg/dL (ref 0.57–1.00)
Chloride: 98 mmol/L (ref 97–108)
GFR calc Af Amer: 106 mL/min/{1.73_m2} (ref >59)
GFR, EST NON AFRICAN AMERICAN: 92 mL/min/{1.73_m2} (ref >59)
GLOBULIN, TOTAL: 2.3 g/dL (ref 1.5–4.5)
GLUCOSE: 91 mg/dL (ref 65–99)
Potassium: 4.1 mmol/L (ref 3.5–5.2)
SODIUM: 140 mmol/L (ref 134–144)
TOTAL PROTEIN: 6.8 g/dL (ref 6.0–8.5)

## 2015-02-07 LAB — LIPID PANEL WITH LDL/HDL RATIO
CHOLESTEROL TOTAL: 184 mg/dL (ref 100–199)
HDL: 59 mg/dL (ref >39)
LDL Calculated: 97 mg/dL (ref 0–99)
LDl/HDL Ratio: 1.6 ratio units (ref 0.0–3.2)
TRIGLYCERIDES: 139 mg/dL (ref 0–149)
VLDL Cholesterol Cal: 28 mg/dL (ref 5–40)

## 2015-02-07 LAB — CBC WITH DIFFERENTIAL/PLATELET
BASOS ABS: 0 10*3/uL (ref 0.0–0.2)
Basos: 1 %
EOS (ABSOLUTE): 0.2 10*3/uL (ref 0.0–0.4)
Eos: 3 %
HEMATOCRIT: 41.8 % (ref 34.0–46.6)
Hemoglobin: 13.4 g/dL (ref 11.1–15.9)
Immature Grans (Abs): 0 10*3/uL (ref 0.0–0.1)
Immature Granulocytes: 0 %
LYMPHS: 27 %
Lymphocytes Absolute: 1.3 10*3/uL (ref 0.7–3.1)
MCH: 28.7 pg (ref 26.6–33.0)
MCHC: 32.1 g/dL (ref 31.5–35.7)
MCV: 90 fL (ref 79–97)
MONOCYTES: 7 %
Monocytes Absolute: 0.4 10*3/uL (ref 0.1–0.9)
NEUTROS ABS: 2.9 10*3/uL (ref 1.4–7.0)
NEUTROS PCT: 62 %
PLATELETS: 342 10*3/uL (ref 150–379)
RBC: 4.67 x10E6/uL (ref 3.77–5.28)
RDW: 13.3 % (ref 12.3–15.4)
WBC: 4.8 10*3/uL (ref 3.4–10.8)

## 2015-02-07 LAB — TSH: TSH: 0.522 u[IU]/mL (ref 0.450–4.500)

## 2015-02-07 LAB — HEMOGLOBIN A1C
ESTIMATED AVERAGE GLUCOSE: 120 mg/dL
Hgb A1c MFr Bld: 5.8 % — ABNORMAL HIGH (ref 4.8–5.6)

## 2015-02-09 ENCOUNTER — Telehealth: Payer: Self-pay | Admitting: Family Medicine

## 2015-02-09 NOTE — Telephone Encounter (Signed)
Spoke with pt, reviewed results.

## 2015-02-09 NOTE — Telephone Encounter (Signed)
Pt stated she was returning [name]'s call and that if she isn't able to answer you can leave a message. Thanks TNP

## 2015-02-24 ENCOUNTER — Other Ambulatory Visit: Payer: Self-pay | Admitting: Plastic Surgery

## 2015-04-30 ENCOUNTER — Ambulatory Visit: Payer: BLUE CROSS/BLUE SHIELD | Admitting: Family Medicine

## 2015-06-18 ENCOUNTER — Other Ambulatory Visit: Payer: Self-pay | Admitting: Family Medicine

## 2015-07-31 ENCOUNTER — Encounter: Payer: Self-pay | Admitting: Family Medicine

## 2015-07-31 ENCOUNTER — Ambulatory Visit (INDEPENDENT_AMBULATORY_CARE_PROVIDER_SITE_OTHER): Payer: BLUE CROSS/BLUE SHIELD | Admitting: Family Medicine

## 2015-07-31 VITALS — BP 132/80 | HR 80 | Temp 98.1°F | Resp 14 | Wt 178.0 lb

## 2015-07-31 DIAGNOSIS — J069 Acute upper respiratory infection, unspecified: Secondary | ICD-10-CM | POA: Diagnosis not present

## 2015-07-31 DIAGNOSIS — H6982 Other specified disorders of Eustachian tube, left ear: Secondary | ICD-10-CM | POA: Diagnosis not present

## 2015-07-31 NOTE — Progress Notes (Signed)
Subjective:     Patient ID: Emily Arellano, female   DOB: 15-Aug-1955, 60 y.o.   MRN: [id number]  HPI  Chief Complaint  Patient presents with  . Ear Pain    Patient states she developed left ear ache/pressure on MOnday January 16th, some numbness sensation present around the left ear to the touch. She states 1 week ago she had called symptom such as congestion, runny nose, nausea, sinus pressure, headache, some sore throat, felt malaise. Now symptoms have improved except the left ear pain and some swollen glands. No fever.  States cough has become productive. Has been taking Advil for her sx. Reports chronic tinnitus.   Review of Systems     Objective:   Physical Exam  Constitutional: She appears well-developed and well-nourished. No distress.  Ears: T.M's intact without inflammation Sinuses: mild left maxillary sinus tenderness Throat: no tonsillar enlargement or exudate Neck: no cervical adenopathy Lungs: clear     Assessment:    1. Upper respiratory infection  2. Eustachian tube dysfunction, left    Plan:    Discussed use of otc medication and resumption of Flonase. If sinuses not clearing over the next two days to call.

## 2015-07-31 NOTE — Patient Instructions (Addendum)
Discussed use of Mucinex D, Delsym for cough, and Benadryl at night for post nasal drainage. Resume fluticasone nasal spray

## 2015-08-03 ENCOUNTER — Other Ambulatory Visit: Payer: Self-pay | Admitting: Family Medicine

## 2015-08-03 ENCOUNTER — Telehealth: Payer: Self-pay | Admitting: Family Medicine

## 2015-08-03 DIAGNOSIS — J019 Acute sinusitis, unspecified: Secondary | ICD-10-CM

## 2015-08-03 MED ORDER — AMOXICILLIN-POT CLAVULANATE 875-125 MG PO TABS: 1.0000 | ORAL_TABLET | Freq: Two times a day (BID) | ORAL | Status: DC

## 2015-08-03 NOTE — Telephone Encounter (Signed)
[email or web address]-KW 

## 2015-08-03 NOTE — Telephone Encounter (Signed)
Pt called saying her symptoms have gotten worse since she seen you on Friday.  A lot of ear pain and her eyes are draining.    She said she needs an antibiotic   She uses [facility] [address]  [employer]

## 2015-08-03 NOTE — Telephone Encounter (Signed)
I have sent in antibiotic. If her eyes are matting would use warm wet compresses several x day.

## 2015-08-03 NOTE — Telephone Encounter (Signed)
Please advise. KW 

## 2015-08-04 NOTE — Telephone Encounter (Signed)
LMTCB 08/04/2015  Thanks,   -Mickel Baas

## 2015-08-05 NOTE — Telephone Encounter (Signed)
[email or web address] sd 

## 2015-09-16 ENCOUNTER — Other Ambulatory Visit: Payer: Self-pay | Admitting: Family Medicine

## 2015-09-21 ENCOUNTER — Other Ambulatory Visit: Payer: Self-pay | Admitting: Family Medicine

## 2015-10-01 ENCOUNTER — Encounter: Payer: Self-pay | Admitting: Internal Medicine

## 2015-10-01 ENCOUNTER — Ambulatory Visit (INDEPENDENT_AMBULATORY_CARE_PROVIDER_SITE_OTHER): Payer: BLUE CROSS/BLUE SHIELD | Admitting: Internal Medicine

## 2015-10-01 VITALS — BP 128/73 | HR 81 | Temp 98.1°F | Ht 66.5 in | Wt 170.4 lb

## 2015-10-01 DIAGNOSIS — Z85850 Personal history of malignant neoplasm of thyroid: Secondary | ICD-10-CM

## 2015-10-01 DIAGNOSIS — M2550 Pain in unspecified joint: Secondary | ICD-10-CM | POA: Diagnosis not present

## 2015-10-01 DIAGNOSIS — E890 Postprocedural hypothyroidism: Secondary | ICD-10-CM

## 2015-10-01 DIAGNOSIS — G4700 Insomnia, unspecified: Secondary | ICD-10-CM | POA: Insufficient documentation

## 2015-10-01 DIAGNOSIS — Z2821 Immunization not carried out because of patient refusal: Secondary | ICD-10-CM

## 2015-10-01 LAB — COMPREHENSIVE METABOLIC PANEL
ALT: 23 U/L (ref 0–35)
AST: 19 U/L (ref 0–37)
Albumin: 4.4 g/dL (ref 3.5–5.2)
Alkaline Phosphatase: 70 U/L (ref 39–117)
BILIRUBIN TOTAL: 0.3 mg/dL (ref 0.2–1.2)
BUN: 13 mg/dL (ref 6–23)
CHLORIDE: 100 meq/L (ref 96–112)
CO2: 27 meq/L (ref 19–32)
Calcium: 8.7 mg/dL (ref 8.4–10.5)
Creatinine, Ser: 0.89 mg/dL (ref 0.40–1.20)
GFR: 69.12 mL/min (ref >60.00)
Glucose, Bld: 103 mg/dL — ABNORMAL HIGH (ref 70–99)
POTASSIUM: 3.9 meq/L (ref 3.5–5.1)
Sodium: 135 mEq/L (ref 135–145)
Total Protein: 7.3 g/dL (ref 6.0–8.3)

## 2015-10-01 LAB — CBC WITH DIFFERENTIAL/PLATELET
BASOS ABS: 0 10*3/uL (ref 0.0–0.1)
BASOS PCT: 0.5 % (ref 0.0–3.0)
Eosinophils Absolute: 0.1 10*3/uL (ref 0.0–0.7)
Eosinophils Relative: 2.4 % (ref 0.0–5.0)
HCT: 38 % (ref 36.0–46.0)
Hemoglobin: 12.7 g/dL (ref 12.0–15.0)
LYMPHS ABS: 1.2 10*3/uL (ref 0.7–4.0)
Lymphocytes Relative: 25.9 % (ref 12.0–46.0)
MCHC: 33.4 g/dL (ref 30.0–36.0)
MCV: 86.6 fl (ref 78.0–100.0)
MONOS PCT: 8.7 % (ref 3.0–12.0)
Monocytes Absolute: 0.4 10*3/uL (ref 0.1–1.0)
NEUTROS ABS: 2.9 10*3/uL (ref 1.4–7.7)
NEUTROS PCT: 62.5 % (ref 43.0–77.0)
PLATELETS: 352 10*3/uL (ref 150.0–400.0)
RBC: 4.39 Mil/uL (ref 3.87–5.11)
RDW: 13.7 % (ref 11.5–15.5)
WBC: 4.6 10*3/uL (ref 4.0–10.5)

## 2015-10-01 LAB — SEDIMENTATION RATE: SED RATE: 18 mm/h (ref 0–22)

## 2015-10-01 LAB — TSH: TSH: 0.54 u[IU]/mL (ref 0.35–4.50)

## 2015-10-01 MED ORDER — ESZOPICLONE 1 MG PO TABS: 1.0000 mg | ORAL_TABLET | Freq: Every evening | ORAL | Status: DC | PRN

## 2015-10-01 MED ORDER — MELOXICAM 15 MG PO TABS: 15.0000 mg | ORAL_TABLET | Freq: Every day | ORAL | Status: DC
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
## 2015-10-01 NOTE — Assessment & Plan Note (Signed)
S/p thyroidectomy. Will check TSH with labs today. Continue Synthroid.

## 2015-10-01 NOTE — Assessment & Plan Note (Signed)
Will check TSH with labs. Continue Synthroid. Discussed that some of her symptoms may not be related to thyroid dysfunction.

## 2015-10-01 NOTE — Patient Instructions (Addendum)
It was nice to meet you.  Stop Ibuprofen.  Start Meloxicam 15mg  daily.  Start Lunesta 1mg  daily at bedtime to help with sleep.  Follow up in 2 weeks.

## 2015-10-01 NOTE — Assessment & Plan Note (Signed)
Worsening insomnia. Discussed several medication options. Will try adding Lunesta. Discussed potential risks of this medication. Follow up in 2 weeks.

## 2015-10-01 NOTE — Assessment & Plan Note (Signed)
Arthralgia, most prominent in hands, most consistent with OA. Will screen for inflammatory arthropathy today. Stop Ibuprofen. Start Meloxicam.

## 2015-10-01 NOTE — Progress Notes (Signed)
Pre visit review using our clinic review tool, if applicable. No additional management support is needed unless otherwise documented below in the visit note. 

## 2015-10-02 LAB — RHEUMATOID FACTOR: Rhuematoid fact SerPl-aCnc: <10 IU/mL (ref <=14)

## 2015-10-02 LAB — ANA: Anti Nuclear Antibody(ANA): NEGATIVE

## 2015-10-02 LAB — VITAMIN D 25 HYDROXY (VIT D DEFICIENCY, FRACTURES): VITD: 62.93 ng/mL (ref 30.00–100.00)

## 2015-10-02 LAB — T4, FREE: FREE T4: 1.48 ng/dL (ref 0.60–1.60)

## 2015-10-12 ENCOUNTER — Telehealth: Payer: Self-pay

## 2015-10-12 NOTE — Telephone Encounter (Signed)
Patient called the triage line.  Patient has been having what she believes to be a sinus infection for the past 2 weeks, she has colored drainage.  I see she is already on your schedule for tomorrow, so this is a FYI for her appt in the am.  Thanks

## 2015-10-13 ENCOUNTER — Encounter: Payer: Self-pay | Admitting: Internal Medicine

## 2015-10-13 ENCOUNTER — Ambulatory Visit (INDEPENDENT_AMBULATORY_CARE_PROVIDER_SITE_OTHER): Payer: BLUE CROSS/BLUE SHIELD | Admitting: Internal Medicine

## 2015-10-13 VITALS — BP 114/75 | HR 73 | Temp 98.3°F | Ht 66.5 in | Wt 171.0 lb

## 2015-10-13 DIAGNOSIS — G4700 Insomnia, unspecified: Secondary | ICD-10-CM

## 2015-10-13 DIAGNOSIS — M2550 Pain in unspecified joint: Secondary | ICD-10-CM

## 2015-10-13 DIAGNOSIS — J0100 Acute maxillary sinusitis, unspecified: Secondary | ICD-10-CM

## 2015-10-13 MED ORDER — AMOXICILLIN-POT CLAVULANATE 875-125 MG PO TABS
1.0000 | ORAL_TABLET | Freq: Two times a day (BID) | ORAL | Status: DC
Start: 2015-10-13 — End: 2015-10-28

## 2015-10-13 NOTE — Progress Notes (Signed)
Pre visit review using our clinic review tool, if applicable. No additional management support is needed unless otherwise documented below in the visit note. 

## 2015-10-13 NOTE — Assessment & Plan Note (Signed)
Symptoms and exam c/w maxillary sinusitis. Will start Augmentin. Continue prn Tylenol, antihistamines. Follow up if symptoms not improving.

## 2015-10-13 NOTE — Assessment & Plan Note (Signed)
Symptoms significantly improved with meloxicam. Will continue.

## 2015-10-13 NOTE — Assessment & Plan Note (Signed)
Symptoms of insomnia improved with Lunesta, however she is still having some trouble falling asleep. Will try increasing dose to 2mg  daily at bedtime. Follow up 4 weeks and prn.

## 2015-10-13 NOTE — Progress Notes (Signed)
Subjective:    Patient ID: Emily Arellano, female    DOB: [birth year], 60 y.o.   MRN: [id number]  HPI  60YO female presents for acute visit.  Sinus pressure - Two weeks of sinus congestion, pressure. Purulent drainage. Last infection 07/2015. No previous sinus surgery. Some fever last week with chills. Using saline rinse with no improvement. Cough occasionally. No dyspnea.  In follow up to previous visit, symptoms of insomnia are improved with Lunesta, however it is taking her 2hr to get to sleep. Some morning grogginess noted. She also reports significant improvement in stiffness in her joints of her hands on Meloxicam. No other side effects noted.   Wt Readings from Last 3 Encounters:  10/13/15 171 lb (77.565 kg)  10/01/15 170 lb 6 oz (77.282 kg)  07/31/15 178 lb (80.74 kg)   BP Readings from Last 3 Encounters:  10/13/15 114/75  10/01/15 128/73  07/31/15 132/80    Past Medical History  Diagnosis Date  . Heart murmur   . Hypothyroidism   . Osteoarthritis   . Anxiety     "xanax prn"  . Thyroid cancer ([place])    Family History  Problem Relation Age of Onset  . Dementia Mother   . Hypertension Mother   . Diabetes Mother   . Cancer Father     lung  . Cancer Sister     bladder   Past Surgical History  Procedure Laterality Date  . Abdominal hysterectomy    . Total thyroidectomy  1993     and        radiation  . Tonsillectomy    . 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 Harvest bone graft  02/23/2012    Procedure: HARVEST ILIAC BONE GRAFT;  Surgeon: Roseanne Kaufman, MD;  Location: [facility];  Service: Orthopedics;  Laterality: N/A;  With Iliac Crest and BMP Bone Graft Fixation As Necessary Radial Nerve Release (Neurolysis as Necessary)   . Tendon repair   10/2011    "posterior; left foot"  . Cholecystectomy  ~ 2005  . Appendectomy  ~ 2005    "I think; when they got my gallbladder"  . Breast surgery  2016    reduction    Social History   Social History  . Marital Status: Married    Spouse Name: Elta Guadeloupe  . Number of Children: 2  . Years of Education: 14   Occupational History  . [employer] and works for attorney    Social History Main Topics  . Smoking status: Never Smoker   . Smokeless tobacco: Never Used  . Alcohol Use: No  . Drug Use: No  . Sexual Activity: Yes   Other Topics Concern  . None   Social History Narrative   Lives in [place]. Married. Has two sons, 46 and 34. Has 3 grandchildren.      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    Review of Systems  Constitutional: Positive for fever and chills. Negative for appetite change, fatigue and unexpected weight change.  HENT: Positive for congestion, postnasal drip and sinus pressure. Negative for ear discharge, ear pain, facial swelling, hearing loss, mouth sores, nosebleeds, rhinorrhea, sneezing, sore throat, tinnitus, trouble swallowing and voice change.  Eyes: Negative for pain, discharge, redness and visual disturbance.  Respiratory: Positive for cough. Negative for chest tightness, shortness of breath, wheezing and stridor.   Cardiovascular: Negative for chest pain, palpitations and leg swelling.  Gastrointestinal: Negative for nausea, abdominal pain, diarrhea and constipation.  Musculoskeletal: Positive for arthralgias. Negative for myalgias, neck pain and neck stiffness.  Skin: Negative for color change and rash.  Neurological: Negative for dizziness, weakness, light-headedness and headaches.  Hematological: Negative for adenopathy. Does not bruise/bleed easily.  Psychiatric/Behavioral: Positive for sleep disturbance. Negative for dysphoric mood. The patient is not nervous/anxious.        Objective:     BP 114/75 mmHg  Pulse 73  Temp(Src) 98.3 F (36.8 C) (Oral)  Ht 5' 6.5" (1.689 m)  Wt 171 lb (77.565 kg)  BMI 27.19 kg/m2  SpO2 98% Physical Exam  Constitutional: She is oriented to person, place, and time. She appears well-developed and well-nourished. No distress.  HENT:  Head: Normocephalic and atraumatic.  Right Ear: Tympanic membrane and external ear normal.  Left Ear: Tympanic membrane and external ear normal.  Nose: Mucosal edema present. Right sinus exhibits maxillary sinus tenderness. Left sinus exhibits maxillary sinus tenderness.  Mouth/Throat: Oropharynx is clear and moist. No oropharyngeal exudate.  Eyes: Conjunctivae are normal. Pupils are equal, round, and reactive to light. Right eye exhibits no discharge. Left eye exhibits no discharge. No scleral icterus.  Neck: Normal range of motion. Neck supple. No tracheal deviation present. No thyromegaly present.  Cardiovascular: Normal rate, regular rhythm, normal heart sounds and intact distal pulses.  Exam reveals no gallop and no friction rub.   No murmur heard. Pulmonary/Chest: Effort normal and breath sounds normal. No accessory muscle usage. No tachypnea. No respiratory distress. She has no decreased breath sounds. She has no wheezes. She has no rhonchi. She has no rales. She exhibits no tenderness.  Musculoskeletal: Normal range of motion. She exhibits no edema or tenderness.  Lymphadenopathy:    She has no cervical adenopathy.  Neurological: She is alert and oriented to person, place, and time. No cranial nerve deficit. She exhibits normal muscle tone. Coordination normal.  Skin: Skin is warm and dry. No rash noted. She is not diaphoretic. No erythema. No pallor.  Psychiatric: She has a normal mood and affect. Her behavior is normal. Judgment and thought content normal.          Assessment & Plan:   Problem List Items Addressed This Visit      Unprioritized   Acute maxillary sinusitis - Primary    Symptoms and  exam c/w maxillary sinusitis. Will start Augmentin. Continue prn Tylenol, antihistamines. Follow up if symptoms not improving.      Relevant Medications   amoxicillin-clavulanate (AUGMENTIN) 875-125 MG tablet   Arthralgia    Symptoms significantly improved with meloxicam. Will continue.      Insomnia    Symptoms of insomnia improved with Lunesta, however she is still having some trouble falling asleep. Will try increasing dose to 2mg  daily at bedtime. Follow up 4 weeks and prn.          Return in about 4 weeks (around 11/10/2015), or if symptoms worsen or fail to improve.  Ronette Deter, MD Internal Medicine [facility]

## 2015-10-13 NOTE — Patient Instructions (Signed)

## 2015-10-20 ENCOUNTER — Ambulatory Visit (INDEPENDENT_AMBULATORY_CARE_PROVIDER_SITE_OTHER): Payer: BLUE CROSS/BLUE SHIELD | Admitting: Podiatry

## 2015-10-20 ENCOUNTER — Ambulatory Visit: Payer: BLUE CROSS/BLUE SHIELD | Admitting: Internal Medicine

## 2015-10-20 ENCOUNTER — Encounter: Payer: Self-pay | Admitting: Podiatry

## 2015-10-20 ENCOUNTER — Ambulatory Visit: Payer: BLUE CROSS/BLUE SHIELD

## 2015-10-20 VITALS — BP 129/81 | HR 89 | Resp 18

## 2015-10-20 DIAGNOSIS — M8430XA Stress fracture, unspecified site, initial encounter for fracture: Secondary | ICD-10-CM

## 2015-10-20 DIAGNOSIS — R52 Pain, unspecified: Secondary | ICD-10-CM

## 2015-10-20 NOTE — Progress Notes (Signed)
   Subjective:    Patient ID: CHIMERE WILMETH, female    DOB: [birth year], 60 y.o.   MRN: [id number]  HPI  60 year old female presents the also concerns of left foot pain which is been ongoing for the last 2 weeks. She started to have pain in the outside part of the foot over the last couple weeks and has had some swelling overlying the area as well but no redness. She cannot recall any recent injury or trauma. She did change her inserts as well as adding some pads to her foot before these symptoms started. She does have a history of a torn posterior tibial tendon due to an injury she has had surgery with Dr. Doran Durand previously. She's had no recent treatment for this. No other complaints at this time.   Review of Systems  All other systems reviewed and are negative.      Objective:   Physical Exam General: AAO x3, NAD  Dermatological: Scars from prior surgery well healed. Skin is warm, dry and supple bilateral. Nails x 10 are well manicured; remaining integument appears unremarkable at this time. There are no open sores, no preulcerative lesions, no rash or signs of infection present.  Vascular: Dorsalis Pedis artery and Posterior Tibial artery pedal pulses are 2/4 bilateral with immedate capillary fill time. Pedal hair growth present. No varicosities and no lower extremity edema present bilateral. There is no pain with calf compression, swelling, warmth, erythema.   Neruologic: Grossly intact via light touch bilateral. Vibratory intact via tuning fork bilateral. Protective threshold with Semmes Wienstein monofilament intact to all pedal sites bilateral. Patellar and Achilles deep tendon reflexes 2+ bilateral. No Babinski or clonus noted bilateral.   Musculoskeletal: There is no tenderness with patient on the areas of previous surgery. There is no pain on the calcaneal cuboid joint. There is tenderness of the fourth and fifth metatarsal cuboid joint. Majority tenderness does appear to be localized  the fourth metatarsal base is some mild tenderness with vibratory sensation over this area. There is mild edema overlying this area as well any erythema or increase in warmth. The left foot appears to be more rectus compared to the right foot and the right foot is somewhat of a flatfoot deformity. No gross boney pedal deformities bilateral. No pain, crepitus, or limitation noted with foot and ankle range of motion bilateral. Muscular strength 5/5 in all groups tested bilateral.  Gait: Unassisted, Nonantalgic.      Assessment & Plan:  60 year old female left lateral foot pain possible fourth metatarsal base stress fracture vs. capsulits -Treatment options discussed including all alternatives, risks, and complications -Etiology of symptoms were discussed -X-rays were obtained and reviewed with the patient. No sclerotic line in base of fourth metatarsal and on the lateral portion of the fourth metatarsal base of the small amount of question periosteal change. Given his swelling to the area as well as tenderness to arise area will treat for stress fracture. She is placing a surgical shoe. Rectal and was all times and limit activity. I'll see her back in 2 weeks for repeat x-rays or sooner if any issues are to arise. Continue antibiotic choices needed. Ice to the area. *x-ray next appointment  Celesta Gentile, DPM

## 2015-10-26 ENCOUNTER — Encounter: Payer: Self-pay | Admitting: Internal Medicine

## 2015-10-27 ENCOUNTER — Other Ambulatory Visit: Payer: Self-pay | Admitting: Family Medicine

## 2015-10-28 ENCOUNTER — Other Ambulatory Visit: Payer: Self-pay | Admitting: Family Medicine

## 2015-10-28 MED ORDER — ZOLPIDEM TARTRATE 5 MG PO TABS: 5.0000 mg | ORAL_TABLET | Freq: Every evening | ORAL | Status: DC | PRN

## 2015-10-29 ENCOUNTER — Other Ambulatory Visit: Payer: Self-pay

## 2015-10-29 MED ORDER — ZOLPIDEM TARTRATE 5 MG PO TABS: 5.0000 mg | ORAL_TABLET | Freq: Every evening | ORAL | Status: DC | PRN

## 2015-10-29 NOTE — Telephone Encounter (Signed)
faxed

## 2015-11-03 ENCOUNTER — Ambulatory Visit: Payer: BLUE CROSS/BLUE SHIELD | Admitting: Podiatry

## 2015-11-20 ENCOUNTER — Encounter: Payer: Self-pay | Admitting: Internal Medicine

## 2015-11-20 ENCOUNTER — Ambulatory Visit (INDEPENDENT_AMBULATORY_CARE_PROVIDER_SITE_OTHER): Payer: BLUE CROSS/BLUE SHIELD | Admitting: Internal Medicine

## 2015-11-20 VITALS — BP 116/82 | HR 80 | Temp 98.1°F | Ht 66.5 in | Wt 171.0 lb

## 2015-11-20 DIAGNOSIS — Z780 Asymptomatic menopausal state: Secondary | ICD-10-CM

## 2015-11-20 DIAGNOSIS — G4700 Insomnia, unspecified: Secondary | ICD-10-CM

## 2015-11-20 DIAGNOSIS — N951 Menopausal and female climacteric states: Secondary | ICD-10-CM

## 2015-11-20 MED ORDER — SYNTHROID 88 MCG PO TABS: 88.0000 ug | ORAL_TABLET | Freq: Every day | ORAL | Status: DC

## 2015-11-20 MED ORDER — TEMAZEPAM 22.5 MG PO CAPS: 22.5000 mg | ORAL_CAPSULE | Freq: Every evening | ORAL | Status: DC | PRN

## 2015-11-20 NOTE — Progress Notes (Signed)
Pre visit review using our clinic review tool, if applicable. No additional management support is needed unless otherwise documented below in the visit note. 

## 2015-11-20 NOTE — Assessment & Plan Note (Signed)
Will set up bone density testing. 

## 2015-11-20 NOTE — Assessment & Plan Note (Signed)
Discussed risks and benefits of sleep aids such as ambien. Will stop Ambien and start Temazepam. Follow up by email next week and in visit in 4 weeks.

## 2015-11-20 NOTE — Progress Notes (Signed)
Subjective:    Patient ID: Emily Arellano, female    DOB: Sep 09, 1955, 60 y.o.   MRN: [id number]  HPI  60YO female presents for follow up.  Insomnia - No improvement with Lunesta. Started Ambien. Taking 7.5mg  at night. Sleeping about 4-5 hr per night. Without medication, mind racing, unable to sleep. In the past, tried Trazodone and Amitryptiline with no improvement. No daytime drowsiness noted.  Metatarsal fracture - Seen by Dr. Doran Durand. Recommended bone density.  Worried about weight gain. Exercise limited after injury and recent metatarsal fracture.   Wt Readings from Last 3 Encounters:  11/20/15 171 lb (77.565 kg)  10/13/15 171 lb (77.565 kg)  10/01/15 170 lb 6 oz (77.282 kg)   BP Readings from Last 3 Encounters:  11/20/15 116/82  10/20/15 129/81  10/13/15 114/75    Past Medical History  Diagnosis Date  . Heart murmur   . Hypothyroidism   . Osteoarthritis   . Anxiety     "xanax prn"  . Thyroid cancer ([place])    Family History  Problem Relation Age of Onset  . Dementia Mother   . Hypertension Mother   . Diabetes Mother   . Cancer Father     lung  . Cancer Sister     bladder   Past Surgical History  Procedure Laterality Date  . Abdominal hysterectomy    . Total thyroidectomy  1993     and        radiation  . Tonsillectomy    . 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 Harvest bone graft  02/23/2012    Procedure: HARVEST ILIAC BONE GRAFT;  Surgeon: Roseanne Kaufman, MD;  Location: [facility];  Service: Orthopedics;  Laterality: N/A;  With Iliac Crest and BMP Bone Graft Fixation As Necessary Radial Nerve Release (Neurolysis as Necessary)   . Tendon repair  10/2011    "posterior; left foot"  . Cholecystectomy  ~ 2005  . Appendectomy  ~ 2005    "I think;  when they got my gallbladder"  . Breast surgery  2016    reduction    Social History   Social History  . Marital Status: Married    Spouse Name: Elta Guadeloupe  . Number of Children: 2  . Years of Education: 14   Occupational History  . [employer] and works for attorney    Social History Main Topics  . Smoking status: Never Smoker   . Smokeless tobacco: Never Used  . Alcohol Use: No  . Drug Use: No  . Sexual Activity: Yes   Other Topics Concern  . None   Social History Narrative   Lives in [place]. Married. Has two sons, 31 and 26. Has 3 grandchildren.      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    Review of Systems  Constitutional: Negative for fever, chills, appetite change, fatigue and unexpected weight change.  Eyes: Negative for visual disturbance.  Respiratory: Negative for shortness of breath.   Cardiovascular: Negative for chest pain and leg swelling.  Gastrointestinal: Negative for vomiting, abdominal pain, diarrhea and constipation.  Musculoskeletal: Positive for myalgias and arthralgias.  Skin: Negative for color change and rash.  Neurological: Negative for weakness.  Hematological:  Negative for adenopathy. Does not bruise/bleed easily.  Psychiatric/Behavioral: Positive for sleep disturbance. Negative for suicidal ideas, behavioral problems, dysphoric mood, decreased concentration and agitation. The patient is not nervous/anxious.        Objective:    BP 116/82 mmHg  Pulse 80  Temp(Src) 98.1 F (36.7 C) (Oral)  Ht 5' 6.5" (1.689 m)  Wt 171 lb (77.565 kg)  BMI 27.19 kg/m2  SpO2 96% Physical Exam  Constitutional: She is oriented to person, place, and time. She appears well-developed and well-nourished. No distress.  HENT:  Head: Normocephalic and atraumatic.  Right Ear: External ear normal.  Left Ear: External ear normal.  Nose: Nose normal.  Mouth/Throat: Oropharynx is clear and moist. No  oropharyngeal exudate.  Eyes: Conjunctivae are normal. Pupils are equal, round, and reactive to light. Right eye exhibits no discharge. Left eye exhibits no discharge. No scleral icterus.  Neck: Normal range of motion. Neck supple. No tracheal deviation present. No thyromegaly present.  Cardiovascular: Normal rate, regular rhythm, normal heart sounds and intact distal pulses.  Exam reveals no gallop and no friction rub.   No murmur heard. Pulmonary/Chest: Effort normal and breath sounds normal. No respiratory distress. She has no wheezes. She has no rales. She exhibits no tenderness.  Musculoskeletal: Normal range of motion. She exhibits no edema or tenderness.  Lymphadenopathy:    She has no cervical adenopathy.  Neurological: She is alert and oriented to person, place, and time. No cranial nerve deficit. She exhibits normal muscle tone. Coordination normal.  Skin: Skin is warm and dry. No rash noted. She is not diaphoretic. No erythema. No pallor.  Psychiatric: She has a normal mood and affect. Her behavior is normal. Judgment and thought content normal.          Assessment & Plan:   Problem List Items Addressed This Visit      Unprioritized   Insomnia - Primary    Discussed risks and benefits of sleep aids such as ambien. Will stop Ambien and start Temazepam. Follow up by email next week and in visit in 4 weeks.      Post menopausal syndrome    Will set up bone density testing.      Relevant Orders   DG Bone Density       Return in about 4 weeks (around 12/18/2015) for Recheck.  Ronette Deter, MD Internal Medicine [facility]

## 2015-11-20 NOTE — Patient Instructions (Signed)
We will set up a bone density test.  Start Temazepam 22.5mg  daily at bedtime to help with sleep.  Follow up by email next week.

## 2015-11-23 ENCOUNTER — Other Ambulatory Visit: Payer: Self-pay | Admitting: Family Medicine

## 2015-12-01 ENCOUNTER — Encounter: Payer: Self-pay | Admitting: Internal Medicine

## 2015-12-05 ENCOUNTER — Other Ambulatory Visit: Payer: Self-pay | Admitting: Family Medicine

## 2015-12-18 ENCOUNTER — Encounter: Payer: Self-pay | Admitting: Internal Medicine

## 2015-12-18 ENCOUNTER — Ambulatory Visit: Payer: BLUE CROSS/BLUE SHIELD | Admitting: Internal Medicine

## 2015-12-24 ENCOUNTER — Ambulatory Visit (INDEPENDENT_AMBULATORY_CARE_PROVIDER_SITE_OTHER): Payer: BLUE CROSS/BLUE SHIELD | Admitting: Internal Medicine

## 2015-12-24 ENCOUNTER — Encounter: Payer: Self-pay | Admitting: Internal Medicine

## 2015-12-24 VITALS — BP 126/78 | HR 70 | Ht 66.0 in | Wt 168.4 lb

## 2015-12-24 DIAGNOSIS — G4700 Insomnia, unspecified: Secondary | ICD-10-CM

## 2015-12-24 DIAGNOSIS — E663 Overweight: Secondary | ICD-10-CM

## 2015-12-24 NOTE — Progress Notes (Signed)
Subjective:    Patient ID: Emily Arellano, female    DOB: [birth year], 60 y.o.   MRN: [id number]  HPI  60YO female presents for follow up.  Recently started on Temazepam for Insomnia. Recent bone density testing was normal.  Insomnia - Symptoms improved with Temazepam. No daytime fatigue.  Continues to be frustrated by weight. Does not exercise. Limits calories and sugar intake   Wt Readings from Last 3 Encounters:  12/24/15 168 lb 6.4 oz (76.386 kg)  11/20/15 171 lb (77.565 kg)  10/13/15 171 lb (77.565 kg)   BP Readings from Last 3 Encounters:  12/24/15 126/78  11/20/15 116/82  10/20/15 129/81    Past Medical History  Diagnosis Date  . Heart murmur   . Hypothyroidism   . Osteoarthritis   . Anxiety     "xanax prn"  . Thyroid cancer ([place])    Family History  Problem Relation Age of Onset  . Dementia Mother   . Hypertension Mother   . Diabetes Mother   . Cancer Father     lung  . Cancer Sister     bladder   Past Surgical History  Procedure Laterality Date  . Abdominal hysterectomy    . Total thyroidectomy  1993     and        radiation  . Tonsillectomy    . 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 Harvest bone graft  02/23/2012    Procedure: HARVEST ILIAC BONE GRAFT;  Surgeon: Roseanne Kaufman, MD;  Location: [facility];  Service: Orthopedics;  Laterality: N/A;  With Iliac Crest and BMP Bone Graft Fixation As Necessary Radial Nerve Release (Neurolysis as Necessary)   . Tendon repair  10/2011    "posterior; left foot"  . Cholecystectomy  ~ 2005  . Appendectomy  ~ 2005    "I think; when they got my gallbladder"  . Breast surgery  2016    reduction    Social History   Social History  . Marital Status: Married    Spouse Name: Elta Guadeloupe  . Number of  Children: 2  . Years of Education: 14   Occupational History  . [employer] and works for attorney    Social History Main Topics  . Smoking status: Never Smoker   . Smokeless tobacco: Never Used  . Alcohol Use: No  . Drug Use: No  . Sexual Activity: Yes   Other Topics Concern  . None   Social History Narrative   Lives in [place]. Married. Has two sons, 86 and 68. Has 3 grandchildren.      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    Review of Systems  Constitutional: Negative for fever, chills, appetite change, fatigue and unexpected weight change.  Eyes: Negative for visual disturbance.  Respiratory: Negative for shortness of breath.   Cardiovascular: Negative for chest pain and leg swelling.  Gastrointestinal: Negative for nausea, vomiting, abdominal pain, diarrhea and constipation.  Skin: Negative for color change and rash.  Hematological: Negative for adenopathy. Does not bruise/bleed easily.  Psychiatric/Behavioral: Positive for sleep disturbance. Negative for dysphoric mood. The patient is not nervous/anxious.        Objective:    BP 126/78 mmHg  Pulse 70  Ht 5\' 6"  (1.676 m)  Wt 168 lb 6.4 oz (76.386 kg)  BMI 27.19 kg/m2  SpO2 98% Physical Exam  Constitutional: She is oriented to person, place, and time. She appears well-developed and well-nourished. No distress.  HENT:  Head: Normocephalic and atraumatic.  Right Ear: External ear normal.  Left Ear: External ear normal.  Nose: Nose normal.  Mouth/Throat: Oropharynx is clear and moist. No oropharyngeal exudate.  Eyes: Conjunctivae are normal. Pupils are equal, round, and reactive to light. Right eye exhibits no discharge. Left eye exhibits no discharge. No scleral icterus.  Neck: Normal range of motion. Neck supple. No tracheal deviation present. No thyromegaly present.  Cardiovascular: Normal rate, regular rhythm, normal heart sounds and intact distal  pulses.  Exam reveals no gallop and no friction rub.   No murmur heard. Pulmonary/Chest: Effort normal and breath sounds normal. No respiratory distress. She has no wheezes. She has no rales. She exhibits no tenderness.  Musculoskeletal: Normal range of motion. She exhibits no edema or tenderness.  Lymphadenopathy:    She has no cervical adenopathy.  Neurological: She is alert and oriented to person, place, and time. No cranial nerve deficit. She exhibits normal muscle tone. Coordination normal.  Skin: Skin is warm and dry. No rash noted. She is not diaphoretic. No erythema. No pallor.  Psychiatric: She has a normal mood and affect. Her behavior is normal. Judgment and thought content normal.          Assessment & Plan:   Problem List Items Addressed This Visit      Unprioritized   Insomnia - Primary    Symptoms improved with Temazepam. Will continue.      Overweight (BMI 25.0-29.9)    Wt Readings from Last 3 Encounters:  12/24/15 168 lb 6.4 oz (76.386 kg)  11/20/15 171 lb (77.565 kg)  10/13/15 171 lb (77.565 kg)   Body mass index is 27.19 kg/(m^2). Encouraged her to try water exercise, which would be safer given previous orthopedic injuries. Encouraged her to limit carbohydrates.          Return in about 6 months (around 06/24/2016) for Recheck.  Ronette Deter, MD Internal Medicine [facility]

## 2015-12-24 NOTE — Patient Instructions (Signed)
Continue current medications    Follow up in 6 months

## 2015-12-24 NOTE — Progress Notes (Signed)
Pre visit review using our clinic review tool, if applicable. No additional management support is needed unless otherwise documented below in the visit note. 

## 2015-12-24 NOTE — Assessment & Plan Note (Signed)
Symptoms improved with Temazepam. Will continue.

## 2015-12-24 NOTE — Assessment & Plan Note (Signed)
Wt Readings from Last 3 Encounters:  12/24/15 168 lb 6.4 oz (76.386 kg)  11/20/15 171 lb (77.565 kg)  10/13/15 171 lb (77.565 kg)   Body mass index is 27.19 kg/(m^2). Encouraged her to try water exercise, which would be safer given previous orthopedic injuries. Encouraged her to limit carbohydrates.

## 2015-12-31 ENCOUNTER — Ambulatory Visit (INDEPENDENT_AMBULATORY_CARE_PROVIDER_SITE_OTHER): Payer: BLUE CROSS/BLUE SHIELD | Admitting: Family Medicine

## 2015-12-31 ENCOUNTER — Encounter: Payer: Self-pay | Admitting: Family Medicine

## 2015-12-31 VITALS — BP 126/82 | HR 77 | Temp 98.2°F | Ht 66.0 in | Wt 172.5 lb

## 2015-12-31 DIAGNOSIS — J019 Acute sinusitis, unspecified: Secondary | ICD-10-CM

## 2015-12-31 DIAGNOSIS — J329 Chronic sinusitis, unspecified: Secondary | ICD-10-CM | POA: Insufficient documentation

## 2015-12-31 MED ORDER — AMOXICILLIN-POT CLAVULANATE 875-125 MG PO TABS: 1.0000 | ORAL_TABLET | Freq: Two times a day (BID) | ORAL | Status: DC

## 2015-12-31 NOTE — Progress Notes (Signed)
Pre visit review using our clinic review tool, if applicable. No additional management support is needed unless otherwise documented below in the visit note. 

## 2015-12-31 NOTE — Progress Notes (Signed)
Subjective:  Patient ID: Emily Arellano, female    DOB: July 01, 1956  Age: 60 y.o. MRN: [id number]  CC: Concern for sinus infection  HPI:  60 year old female presents with concerns that she has a sinus infection.  Patient states that she's not been feeling well since Saturday. She states that she initially developed sore throat. She has now developed drainage and productive cough (green sputum) as well as congestion. Questionable fever on Monday. No fever since then. She been using a nasal spray for her congestion. No known exacerbating factors. Symptoms are severe. No other complaints at this time.  Social Hx   Social History   Social History  . Marital Status: Married    Spouse Name: Elta Guadeloupe  . Number of Children: 2  . Years of Education: 14   Occupational History  . [employer] and works for attorney    Social History Main Topics  . Smoking status: Never Smoker   . Smokeless tobacco: Never Used  . Alcohol Use: No  . Drug Use: No  . Sexual Activity: Yes   Other Topics Concern  . None   Social History Narrative   Lives in [place]. Married. Has two sons, 66 and 64. Has 3 grandchildren.      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   Review of Systems  HENT: Positive for congestion and sore throat.   Respiratory: Positive for cough.    Objective:  BP 126/82 mmHg  Pulse 77  Temp(Src) 98.2 F (36.8 C) (Oral)  Ht 5\' 6"  (1.676 m)  Wt 172 lb 8 oz (78.245 kg)  BMI 27.86 kg/m2  SpO2 96%  BP/Weight 12/31/2015 12/24/2015 [id number]  Systolic BP 123XX123 123XX123 [id number]  Diastolic BP 82 78 82  Wt. (Lbs) 172.5 168.4 171  BMI 27.86 27.19 27.19   Physical Exam  Constitutional: She is oriented to person, place, and time. She appears well-developed and well-nourished. No distress.  HENT:  Head: Normocephalic and atraumatic.  Oropharynx with mild erythema. No exudate. Normal TMs bilaterally.  Eyes: Conjunctivae are normal. No  scleral icterus.  Neck: Neck supple.  Cardiovascular: Normal rate and regular rhythm.   Pulmonary/Chest: Effort normal. She has no wheezes. She has no rales.  Lymphadenopathy:    She has no cervical adenopathy.  Neurological: She is alert and oriented to person, place, and time.  Vitals reviewed.  Lab Results  Component Value Date   WBC 4.6 10/01/2015   HGB 12.7 10/01/2015   HCT 38.0 10/01/2015   PLT 352.0 10/01/2015   GLUCOSE 103* 10/01/2015   CHOL 184 02/06/2015   TRIG 139 02/06/2015   HDL 59 02/06/2015   LDLCALC 97 02/06/2015   ALT 23 10/01/2015   AST 19 10/01/2015   NA 135 10/01/2015   K 3.9 10/01/2015   CL 100 10/01/2015   CREATININE 0.89 10/01/2015   BUN 13 10/01/2015   CO2 27 10/01/2015   TSH 0.54 10/01/2015   HGBA1C 5.8* 02/06/2015    Assessment & Plan:   Problem List Items Addressed This Visit    Sinusitis - Primary    New acute problem. Exam unremarkable. Patient appears well. I informed her that I do not think she has a bacterial infection at this time. Patient's states that she still thinks she has a bacterial sinus infection and was adamant about antibiotics. Rx was given for Augmentin to be filled if she fails to improve or  worsens.       Relevant Medications   amoxicillin-clavulanate (AUGMENTIN) 875-125 MG tablet      Meds ordered this encounter  Medications  . amoxicillin-clavulanate (AUGMENTIN) 875-125 MG tablet    Sig: Take 1 tablet by mouth 2 (two) times daily.    Dispense:  20 tablet    Refill:  0   Follow-up: PRN  [place]

## 2015-12-31 NOTE — Patient Instructions (Signed)
This is viral.  If you fail to improve or worsen, take the antibiotic.  Follow up as needed.  Take care  Dr. Lacinda Axon

## 2015-12-31 NOTE — Assessment & Plan Note (Signed)
New acute problem. Exam unremarkable. Patient appears well. I informed her that I do not think she has a bacterial infection at this time. Patient's states that she still thinks she has a bacterial sinus infection and was adamant about antibiotics. Rx was given for Augmentin to be filled if she fails to improve or worsens.

## 2016-02-18 ENCOUNTER — Telehealth: Payer: Self-pay | Admitting: Internal Medicine

## 2016-02-18 NOTE — Telephone Encounter (Signed)
Noted thanks °

## 2016-02-18 NOTE — Telephone Encounter (Signed)
Patient needs to establish care with a new provider to get a refill on this medication. Please advise and schedule a appt. thanks

## 2016-02-18 NOTE — Telephone Encounter (Signed)
Pt was called. She will be seeing Dr. Caryl Bis on 02/19/16 to est. Care and medication consult.

## 2016-02-18 NOTE — Telephone Encounter (Signed)
Pt is calling for a refill on her temazepam (RESTORIL) 22.5 MG capsule. Her pharmacy is [facility] on [address]

## 2016-02-18 NOTE — Telephone Encounter (Signed)
We will plan to refill tomorrow as she has an appointment.

## 2016-02-18 NOTE — Telephone Encounter (Signed)
Please advise on a refill, she is going to see you tomorrow, thanks

## 2016-02-19 ENCOUNTER — Encounter: Payer: Self-pay | Admitting: Family Medicine

## 2016-02-19 ENCOUNTER — Ambulatory Visit (INDEPENDENT_AMBULATORY_CARE_PROVIDER_SITE_OTHER): Payer: BLUE CROSS/BLUE SHIELD | Admitting: Family Medicine

## 2016-02-19 VITALS — BP 128/84 | HR 73 | Temp 98.8°F | Resp 14 | Wt 167.0 lb

## 2016-02-19 DIAGNOSIS — E890 Postprocedural hypothyroidism: Secondary | ICD-10-CM | POA: Diagnosis not present

## 2016-02-19 DIAGNOSIS — G4700 Insomnia, unspecified: Secondary | ICD-10-CM | POA: Diagnosis not present

## 2016-02-19 DIAGNOSIS — J309 Allergic rhinitis, unspecified: Secondary | ICD-10-CM | POA: Diagnosis not present

## 2016-02-19 LAB — TSH: TSH: 0.64 u[IU]/mL (ref 0.35–4.50)

## 2016-02-19 MED ORDER — TEMAZEPAM 22.5 MG PO CAPS: 22.5000 mg | ORAL_CAPSULE | Freq: Every evening | ORAL | 3 refills | Status: DC | PRN

## 2016-02-19 NOTE — Progress Notes (Signed)
Pre visit review using our clinic review tool, if applicable. No additional management support is needed unless otherwise documented below in the visit note. 

## 2016-02-19 NOTE — Assessment & Plan Note (Signed)
Doing quite well on Restoril. We will continue this medication. Refills provided. 

## 2016-02-19 NOTE — Progress Notes (Signed)
  Tommi Rumps, MD Phone: [phone]  Emily Arellano is a 60 y.o. female who presents today for f/u.  HYPOTHYROIDISM-status post thyroid cancer and thyroidectomy Disease Monitoring Weight changes: No  Skin Changes: No Heat/Cold intolerance: Yes  Medication Monitoring Compliance:  Taking Synthroid daily   Last TSH:   Lab Results  Component Value Date   TSH 0.54 10/01/2015   Allergic rhinitis: Patient notes has had increased allergy symptoms this year. Notes some congestion, rhinorrhea, and postnasal drip. Takes Allegra and Zyrtec. Also using Flonase. Notes it feels as though her voice is tired related to her postnasal drip.  Insomnia: Has done quite well on Restoril. Goes to sleep about 10:30 and wakes up at 5:30. Has not been drowsy with this. Has been functioning much better since she has been getting increased sleep. No longer pacing the halls at night.  PMH: nonsmoker.   ROS see history of present illness  Objective  Physical Exam Vitals:   02/19/16 1046  BP: 128/84  Pulse: 73  Resp: 14  Temp: 98.8 F (37.1 C)    BP Readings from Last 3 Encounters:  02/19/16 128/84  12/31/15 126/82  12/24/15 126/78   Wt Readings from Last 3 Encounters:  02/19/16 167 lb (75.8 kg)  12/31/15 172 lb 8 oz (78.2 kg)  12/24/15 168 lb 6.4 oz (76.4 kg)    Physical Exam  Constitutional: No distress.  HENT:  Minimal posterior oropharyngeal erythema  Eyes: Conjunctivae are normal. Pupils are equal, round, and reactive to light.  Neck: Neck supple. No thyromegaly present.  Cardiovascular: Normal rate, regular rhythm and normal heart sounds.   Pulmonary/Chest: Effort normal and breath sounds normal.  Lymphadenopathy:    She has no cervical adenopathy.  Neurological: She is alert. Gait normal.  Skin: Skin is warm and dry. She is not diaphoretic.     Assessment/Plan: Please see individual problem list.  Allergic rhinitis Symptoms consistent with allergic rhinitis. Suspect  postnasal drip has led her voice to feel tired. She will monitor symptoms and if they don't improve she will let us know. If her voice remains tired following resolution of her allergy symptoms she'll let us know we will evaluate further.  Adult hypothyroidism We will check a TSH today. Continue Synthroid.  Insomnia Doing quite well on Restoril. We will continue this medication. Refills provided.   Orders Placed This Encounter  Procedures  . TSH    Meds ordered this encounter  Medications  . ibuprofen (ADVIL,MOTRIN) 200 MG tablet    Sig: Take 200 mg by mouth every 6 (six) hours as needed.  . temazepam (RESTORIL) 22.5 MG capsule    Sig: Take 1 capsule (22.5 mg total) by mouth at bedtime as needed for sleep.    Dispense:  30 capsule    Refill:  [place], MD [place]

## 2016-02-19 NOTE — Patient Instructions (Signed)
Nice to meet you. We'll check your thyroid function today. Next on please continue your Synthroid. Please monitor your allergy symptoms and if they do not improve or if your voice continues to feel tired despite improvement please let us know. I refilled her Restoril as well.

## 2016-02-19 NOTE — Assessment & Plan Note (Signed)
Symptoms consistent with allergic rhinitis. Suspect postnasal drip has led her voice to feel tired. She will monitor symptoms and if they don't improve she will let us know. If her voice remains tired following resolution of her allergy symptoms she'll let us know we will evaluate further.

## 2016-02-19 NOTE — Assessment & Plan Note (Signed)
We will check a TSH today. Continue Synthroid.

## 2016-04-27 ENCOUNTER — Ambulatory Visit: Payer: BLUE CROSS/BLUE SHIELD | Admitting: Family Medicine

## 2016-04-29 ENCOUNTER — Encounter: Payer: Self-pay | Admitting: Internal Medicine

## 2016-04-29 ENCOUNTER — Ambulatory Visit (INDEPENDENT_AMBULATORY_CARE_PROVIDER_SITE_OTHER): Payer: BLUE CROSS/BLUE SHIELD | Admitting: Internal Medicine

## 2016-04-29 ENCOUNTER — Ambulatory Visit (INDEPENDENT_AMBULATORY_CARE_PROVIDER_SITE_OTHER): Payer: BLUE CROSS/BLUE SHIELD

## 2016-04-29 VITALS — BP 128/78 | HR 70 | Temp 98.2°F | Resp 12 | Ht 66.0 in | Wt 168.8 lb

## 2016-04-29 DIAGNOSIS — E663 Overweight: Secondary | ICD-10-CM

## 2016-04-29 DIAGNOSIS — M542 Cervicalgia: Secondary | ICD-10-CM

## 2016-04-29 MED ORDER — CELECOXIB 400 MG PO CAPS: 400.0000 mg | ORAL_CAPSULE | Freq: Every day | ORAL | 5 refills | Status: DC

## 2016-04-29 MED ORDER — DIAZEPAM 5 MG PO TABS: 5.0000 mg | ORAL_TABLET | Freq: Every evening | ORAL | Status: DC | PRN
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
## 2016-04-29 NOTE — Patient Instructions (Addendum)
You can take 2000 mg tylenol every day for pain control; this can be added to motrin or  celebrex    Trial of valium 5 mg at bedtime INSTEAD OF RESTORIL/FLEXERIL. YOU MAY HAVE TO INCREASE TO 10 MG   Consider joining [place] one mornign at the [place].  My trainer is Tollie Pizza [phone]   This is  Dr. Lupita Dawn  example of a  "Low GI"  Diet:  It will allow you to lose 4 to 8  lbs  per month if you follow it carefully.  Your goal with exercise is a minimum of 30 minutes of aerobic exercise 5 days per week (Walking does not count once it becomes easy!)    All of the foods can be found at grocery stores and in bulk at [employer].  The Atkins protein bars and shakes are available in more varieties at Target, [employer] and [facility].     7 AM Breakfast:  Choose from the following:  Low carbohydrate Protein  Shakes (I recommend the  Premier Protein chocolate shakes,  EAS AdvantEdge "Carb Control" shakes  Or the Atkins shakes all are under 3 net carbs)     a scrambled egg/bacon/cheese burrito made with Mission's "carb balance" whole wheat tortilla  (about 10 net carbs )  Regulatory affairs officer (basically a quiche without the pastry crust) that is eaten cold and very convenient way to get your eggs.  8 carbs)  If you make your own protein shakes, avoid bananas and pineapple,  And use low carb greek yogurt or original /unsweetened almond or soy milk    Avoid cereal and bananas, oatmeal and cream of wheat and grits. They are loaded with carbohydrates!   10 AM: high protein snack:  Protein bar by Atkins (the snack size, under 200 cal, usually < 6 net carbs).    A stick of cheese:  Around 1 carb,  100 cal     Dannon Light n Fit [place] Yogurt  (80 cal, 8 carbs)  Other so called "protein bars" and Greek yogurts tend to be loaded with carbohydrates.  Remember, in food advertising, the word "energy" is synonymous for " carbohydrate."  Lunch:   A Sandwich using the bread choices listed, Can  use any  Eggs,  lunchmeat, grilled meat or canned tuna), avocado, regular mayo/mustard  and cheese.  A Salad using blue cheese, ranch,  Goddess or vinagrette,  Avoid taco shells, croutons or "confetti" and no "candied nuts" but regular nuts OK.   No pretzels, nabs  or chips.  Pickles and miniature sweet peppers are a good low carb alternative that provide a "crunch"  The bread is the only source of carbohydrate in a sandwich and  can be decreased by trying some of the attached alternatives to traditional loaf bread   Avoid "Low fat dressings, as well as [place] dressings They are loaded with sugar!   3 PM/ Mid day  Snack:  Consider  1 ounce of  almonds, walnuts, pistachios, pecans, peanuts,  Macadamia nuts or a nut medley.  Avoid "granola and granola bars "  Mixed nuts are ok in moderation as long as there are no raisins,  cranberries or dried fruit.   KIND bars are OK if you get the low glycemic index variety   Try the prosciutto/mozzarella cheese sticks by F[employer]ruci  In deli /backery section   High protein      6 PM  Dinner:  Meat/fowl/fish with a green salad, and either broccoli, cauliflower, green beans, spinach, brussel sprouts or  Lima beans. DO NOT BREAD THE PROTEIN!!      There is a low carb pasta by Dreamfield's that is acceptable and tastes great: only 5 digestible carbs/serving.( All grocery stores but BJs carry it ) Several ready made meals are available low carb:   Try Michel Angelo's chicken piccata or chicken or eggplant parm over low carb pasta.(Lowes and BJs)   Marjory Lies Sanchez's "Carnitas" (pulled pork, no sauce,  0 carbs) or his beef pot roast to make a dinner burrito (at [employer]J's)  Pesto over low carb pasta (bj's sells a good quality pesto in the center refrigerated section of the deli   Try satueeing  Cheral Marker with mushroooms as a good side   Green Giant makes a mashed cauliflower that tastes like mashed potatoes  Whole wheat pasta is still full of  digestible carbs and  Not as low in glycemic index as Dreamfield's.   Brown rice is still rice,  So skip the rice and noodles if you eat [place] or [place] (or at least limit to 1/2 cup)  9 PM snack :   Breyer's "low carb" fudgsicle or  ice cream bar (Carb Smart line), or  Weight Watcher's ice cream bar , or another "no sugar added" ice cream;  a serving of fresh berries/cherries with whipped cream   Cheese or DANNON'S LlGHT N FIT GREEK YOGURT  8 ounces of [place] unsweetened almond/cococunut milk    Treat yourself to a parfait made with whipped cream blueberiies, walnuts and vanilla greek yogurt  Avoid bananas, pineapple, grapes  and watermelon on a regular basis because they are high in sugar.  THINK OF THEM AS DESSERT  Remember that snack Substitutions should be less than 10 NET carbs per serving and meals < 20 carbs. Remember to subtract fiber grams to get the "net carbs."

## 2016-04-29 NOTE — Progress Notes (Signed)
Pre-visit discussion using our clinic review tool. No additional management support is needed unless otherwise documented below in the visit note.  

## 2016-05-01 ENCOUNTER — Encounter: Payer: Self-pay | Admitting: Internal Medicine

## 2016-05-01 DIAGNOSIS — M542 Cervicalgia: Secondary | ICD-10-CM | POA: Insufficient documentation

## 2016-05-01 NOTE — Assessment & Plan Note (Signed)
Rviewed a low glycemic index diet utilizing smaller more frequent meals to increase metabolism.  I have also recommended that patient start exercising with a goal of 30 minutes of aerobic exercise a minimum of 5 days per week. Screening for lipid disorders, thyroid and diabetes to be done today.

## 2016-05-01 NOTE — Assessment & Plan Note (Addendum)
Secondary to osteophyte formation at C5-6 and C6-7 levels with suggestion of foraminal stenosis bilaterally . Ad reverse of lordosis suggesting muscle spasm. MRi cervical spine ordered.  Trial of  valium for muyscles spasm and insomnia (replacing restoril)

## 2016-05-19 ENCOUNTER — Ambulatory Visit
Admission: RE | Admit: 2016-05-19 | Discharge: 2016-05-19 | Disposition: A | Discharge status: Home / Self Care | Payer: BLUE CROSS/BLUE SHIELD | Source: Ambulatory Visit | Attending: Internal Medicine | Admitting: Internal Medicine

## 2016-05-19 DIAGNOSIS — M542 Cervicalgia: Secondary | ICD-10-CM | POA: Insufficient documentation

## 2016-05-26 ENCOUNTER — Telehealth: Payer: Self-pay | Admitting: *Deleted

## 2016-05-26 NOTE — Telephone Encounter (Signed)
I sent her the results via Mychart On nov 12

## 2016-05-26 NOTE — Telephone Encounter (Signed)
Left message for patient to call office.,results posted below that were sent Your cervical spine MRI was reassuring that , in Spite of degenerative changes and spurring at multiple levels., there was no evidence of spinal stenosis or clear evidence that a nerve root was impinged (Pinched). The changes are worse on the left, however at the C5 -6 level.  If you would like to be referred to the Pain clinic or to Dr Sharlet Salina, I would be happy to refer you.   Regards,    Deborra Medina, MD   by My chart.

## 2016-05-26 NOTE — Telephone Encounter (Signed)
Results in chart

## 2016-05-26 NOTE — Telephone Encounter (Signed)
Patient requested her MRI results if available Pt contact   [phone] A voicemail can be left

## 2016-05-27 NOTE — Telephone Encounter (Signed)
Pt called back in regards MRI results. Pt stated that you may leave them on her vm. Thank you!  Call pt @ [phone]

## 2016-05-27 NOTE — Telephone Encounter (Signed)
Patient tnoified of results.

## 2016-06-20 ENCOUNTER — Other Ambulatory Visit: Payer: Self-pay | Admitting: Internal Medicine

## 2016-06-21 NOTE — Telephone Encounter (Signed)
Okay to refill? Last filled on: 02/19/16 #30 with 3 refills.  Last OV: 04/29/16 Next OV: 08/05/16

## 2016-06-22 NOTE — Telephone Encounter (Signed)
Rx faxed to [facility] 

## 2016-06-24 ENCOUNTER — Ambulatory Visit: Payer: BLUE CROSS/BLUE SHIELD | Admitting: Internal Medicine

## 2016-07-25 ENCOUNTER — Encounter: Payer: Self-pay | Admitting: Internal Medicine

## 2016-07-26 ENCOUNTER — Other Ambulatory Visit: Payer: Self-pay | Admitting: Internal Medicine

## 2016-07-26 MED ORDER — TEMAZEPAM 30 MG PO CAPS: 30.0000 mg | ORAL_CAPSULE | Freq: Every evening | ORAL | 0 refills | Status: DC | PRN

## 2016-07-26 NOTE — Telephone Encounter (Signed)
Rx faxed to [facility]

## 2016-08-05 ENCOUNTER — Ambulatory Visit: Payer: BLUE CROSS/BLUE SHIELD | Admitting: Internal Medicine

## 2016-08-19 ENCOUNTER — Ambulatory Visit: Payer: BLUE CROSS/BLUE SHIELD | Admitting: Family Medicine

## 2016-08-26 ENCOUNTER — Other Ambulatory Visit: Payer: Self-pay | Admitting: Internal Medicine

## 2016-08-31 ENCOUNTER — Telehealth: Payer: Self-pay | Admitting: *Deleted

## 2016-08-31 ENCOUNTER — Other Ambulatory Visit: Payer: Self-pay | Admitting: Internal Medicine

## 2016-08-31 NOTE — Telephone Encounter (Signed)
printed

## 2016-08-31 NOTE — Telephone Encounter (Signed)
Can this prescription be printed off again so that it can be re-faxed? Can't find where it was faxed to the pharmacy.

## 2016-08-31 NOTE — Telephone Encounter (Signed)
Faxed and pt is aware.  

## 2016-08-31 NOTE — Telephone Encounter (Signed)
Pt has requested a medication refill for temazepam Pharmacy [facility]

## 2016-09-01 NOTE — Telephone Encounter (Signed)
Done. See 08/31/16 refill encounter.

## 2016-10-14 ENCOUNTER — Ambulatory Visit (INDEPENDENT_AMBULATORY_CARE_PROVIDER_SITE_OTHER): Payer: No Typology Code available for payment source | Admitting: Internal Medicine

## 2016-10-14 ENCOUNTER — Encounter: Payer: Self-pay | Admitting: Internal Medicine

## 2016-10-14 VITALS — BP 116/78 | HR 65 | Temp 97.7°F | Resp 17 | Ht 66.0 in | Wt 171.6 lb

## 2016-10-14 DIAGNOSIS — Z23 Encounter for immunization: Secondary | ICD-10-CM | POA: Diagnosis not present

## 2016-10-14 DIAGNOSIS — Z85850 Personal history of malignant neoplasm of thyroid: Secondary | ICD-10-CM

## 2016-10-14 DIAGNOSIS — E559 Vitamin D deficiency, unspecified: Secondary | ICD-10-CM

## 2016-10-14 DIAGNOSIS — Z0000 Encounter for general adult medical examination without abnormal findings: Secondary | ICD-10-CM

## 2016-10-14 DIAGNOSIS — E663 Overweight: Secondary | ICD-10-CM | POA: Diagnosis not present

## 2016-10-14 DIAGNOSIS — R7301 Impaired fasting glucose: Secondary | ICD-10-CM

## 2016-10-14 DIAGNOSIS — E039 Hypothyroidism, unspecified: Secondary | ICD-10-CM

## 2016-10-14 DIAGNOSIS — E7800 Pure hypercholesterolemia, unspecified: Secondary | ICD-10-CM | POA: Diagnosis not present

## 2016-10-14 DIAGNOSIS — R635 Abnormal weight gain: Secondary | ICD-10-CM | POA: Diagnosis not present

## 2016-10-14 DIAGNOSIS — R5383 Other fatigue: Secondary | ICD-10-CM

## 2016-10-14 LAB — COMPREHENSIVE METABOLIC PANEL
ALK PHOS: 69 U/L (ref 39–117)
ALT: 17 U/L (ref 0–35)
AST: 18 U/L (ref 0–37)
Albumin: 4.4 g/dL (ref 3.5–5.2)
BUN: 18 mg/dL (ref 6–23)
CO2: 29 meq/L (ref 19–32)
Calcium: 9.1 mg/dL (ref 8.4–10.5)
Chloride: 101 mEq/L (ref 96–112)
Creatinine, Ser: 0.86 mg/dL (ref 0.40–1.20)
GFR: 71.65 mL/min (ref >60.00)
GLUCOSE: 100 mg/dL — AB (ref 70–99)
POTASSIUM: 4.2 meq/L (ref 3.5–5.1)
SODIUM: 137 meq/L (ref 135–145)
TOTAL PROTEIN: 7.2 g/dL (ref 6.0–8.3)
Total Bilirubin: 0.3 mg/dL (ref 0.2–1.2)

## 2016-10-14 LAB — CBC WITH DIFFERENTIAL/PLATELET
BASOS ABS: 0 10*3/uL (ref 0.0–0.1)
BASOS PCT: 0.8 % (ref 0.0–3.0)
Eosinophils Absolute: 0.1 10*3/uL (ref 0.0–0.7)
Eosinophils Relative: 2.2 % (ref 0.0–5.0)
HEMATOCRIT: 41.7 % (ref 36.0–46.0)
Hemoglobin: 13.5 g/dL (ref 12.0–15.0)
LYMPHS ABS: 1.1 10*3/uL (ref 0.7–4.0)
LYMPHS PCT: 26.9 % (ref 12.0–46.0)
MCHC: 32.4 g/dL (ref 30.0–36.0)
MCV: 88.4 fl (ref 78.0–100.0)
MONOS PCT: 9.4 % (ref 3.0–12.0)
Monocytes Absolute: 0.4 10*3/uL (ref 0.1–1.0)
NEUTROS ABS: 2.5 10*3/uL (ref 1.4–7.7)
NEUTROS PCT: 60.7 % (ref 43.0–77.0)
PLATELETS: 346 10*3/uL (ref 150.0–400.0)
RBC: 4.72 Mil/uL (ref 3.87–5.11)
RDW: 13.3 % (ref 11.5–15.5)
WBC: 4.1 10*3/uL (ref 4.0–10.5)

## 2016-10-14 LAB — LIPID PANEL
CHOL/HDL RATIO: 3
Cholesterol: 211 mg/dL — ABNORMAL HIGH (ref 0–200)
HDL: 69.2 mg/dL (ref >39.00)
LDL Cholesterol: 122 mg/dL — ABNORMAL HIGH (ref 0–99)
NONHDL: 141.48
Triglycerides: 95 mg/dL (ref 0.0–149.0)
VLDL: 19 mg/dL (ref 0.0–40.0)

## 2016-10-14 LAB — VITAMIN D 25 HYDROXY (VIT D DEFICIENCY, FRACTURES): VITD: 50.13 ng/mL (ref 30.00–100.00)

## 2016-10-14 LAB — HEMOGLOBIN A1C: HEMOGLOBIN A1C: 6.1 % (ref 4.6–6.5)

## 2016-10-14 MED ORDER — LIRAGLUTIDE -WEIGHT MANAGEMENT 18 MG/3ML ~~LOC~~ SOPN: 0.6000 mg | PEN_INJECTOR | Freq: Every day | SUBCUTANEOUS | 0 refills | Status: DC

## 2016-10-14 MED ORDER — MONTELUKAST SODIUM 10 MG PO TABS: 10.0000 mg | ORAL_TABLET | Freq: Every day | ORAL | 3 refills | Status: DC

## 2016-10-14 NOTE — Progress Notes (Signed)
Pre visit review using our clinic review tool, if applicable. No additional management support is needed unless otherwise documented below in the visit note. 

## 2016-10-14 NOTE — Progress Notes (Signed)
Patient ID: Emily Arellano, female    DOB: May 04, 1956  Age: 61 y.o. MRN: [id number]  The patient is here for annual examination and management of other chronic and acute problems.  MAMMOGRAM SET UP THIS MONTH HISTORY OF THYROID CANCER ON SYNTHROID   PAP 2016 COLON 2010, [place]   MEDISHARE  Lab Results  Component Value Date   HGBA1C 6.1 10/14/2016      The risk factors are reflected in the social history.  The roster of all physicians providing medical care to patient - is listed in the Snapshot section of the chart.  Home safety : The patient has smoke detectors in the home. They wear seatbelts.  There are no firearms at home. There is no violence in the home.   There is no risks for hepatitis, STDs or HIV. There is no   history of blood transfusion. They have no travel history to infectious disease endemic areas of the world.  The patient has seen their dentist in the last six month. They have seen their eye doctor in the last year.   They do not  have excessive sun exposure. Discussed the need for sun protection: hats, long sleeves and use of sunscreen if there is significant sun exposure.   Diet: the importance of a healthy diet is discussed. They do have a healthy diet.  The benefits of regular aerobic exercise were discussed. She walks 4 times per week ,  20 minutes.   Depression screen: there are no signs or vegative symptoms of depression- irritability, change in appetite, anhedonia, sadness/tearfullness.   The following portions of the patient's history were reviewed and updated as appropriate: allergies, current medications, past family history, past medical history,  past surgical history, past social history  and problem list.  Visual acuity was not assessed per patient preference since she has regular follow up with her ophthalmologist. Hearing and body mass index were assessed and reviewed.   During the course of the visit the patient was educated and counseled about  appropriate screening and preventive services including : fall prevention , diabetes screening, nutrition counseling, colorectal cancer screening, and recommended immunizations.    CC: The primary encounter diagnosis was Need for Tdap vaccination. Diagnoses of Pure hypercholesterolemia, IATROGENIC HYPOTH, Weight gain, Vitamin D deficiency, Other fatigue, Impaired fasting glucose, H/O malignant neoplasm of thyroid, Overweight (BMI 25.0-29.9), and Encounter for preventive health examination were also pertinent to this visit.   1) inability to lose weight   2) vaginal DRYNESS  INTERFERING WITH INTERCOURSE  3) DUE FOR TDAP.  Discussed her daughter's refusal to vaccinate her children due to concern about the highly publicized association with autism   History Emily Arellano has a past medical history of Anxiety; Heart murmur; Hypothyroidism; Osteoarthritis; and Thyroid cancer ([place]).   She has a past surgical history that includes Abdominal hysterectomy; Total thyroidectomy (1993); Tonsillectomy; ORIF humerus fracture (02/23/2012); [place] bone graft (02/23/2012); Tendon repair (10/2011); Cholecystectomy (~ 2005); Appendectomy (~ 2005); and Breast surgery (2016).   Her family history includes Cancer in her father and sister; Dementia in her mother; Diabetes in her mother; Hypertension in her mother.She reports that she has never smoked. She has never used smokeless tobacco. She reports that she does not drink alcohol or use drugs.  Outpatient Medications Prior to Visit  Medication Sig Dispense Refill  . Calcium 150 MG TABS Take 2 tablets by mouth at bedtime.    . celecoxib (CELEBREX) 400 MG capsule Take 1 capsule (400 mg total) by  mouth daily after breakfast. 30 capsule 5  . Cholecalciferol (VITAMIN D3) 2000 UNITS capsule Take by mouth.    . Cyanocobalamin (VITAMIN B12) 3000 MCG/ML LIQD Place under the tongue.    . fluticasone (FLONASE) 50 MCG/ACT nasal spray Place into the nose.    Marland Kitchen MAGNESIUM PO Take 3-4  tablets by mouth at bedtime.    . temazepam (RESTORIL) 30 MG capsule TAKE 1 CAPSULE AT BEDTIME AS NEEDED FOR SLEEP 30 capsule 3  . SYNTHROID 88 MCG tablet TAKE 1 TABLET BY MOUTH EVERY DAY 90 tablet 4  . cyclobenzaprine (FLEXERIL) 10 MG tablet TAKE 1 TABLET BY MOUTH AT BEDTIME AS NEEDED (Patient not taking: Reported on 10/14/2016) 30 tablet 5  . ibuprofen (ADVIL,MOTRIN) 200 MG tablet Take 200 mg by mouth every 6 (six) hours as needed.    Marland Kitchen omeprazole (PRILOSEC) 40 MG capsule Take 40 mg by mouth daily.  4   No facility-administered medications prior to visit.     Review of Systems   Patient denies headache, fevers, malaise, unintentional weight loss, skin rash, eye pain, sinus congestion and sinus pain, sore throat, dysphagia,  hemoptysis , cough, dyspnea, wheezing, chest pain, palpitations, orthopnea, edema, abdominal pain, nausea, melena, diarrhea, constipation, flank pain, dysuria, hematuria, urinary  Frequency, nocturia, numbness, tingling, seizures,  Focal weakness, Loss of consciousness,  Tremor, insomnia, depression, anxiety, and suicidal ideation.      Objective:  BP 116/78   Pulse 65   Temp 97.7 F (36.5 C) (Oral)   Resp 17   Ht 5\' 6"  (1.676 m)   Wt 171 lb 9.6 oz (77.8 kg)   SpO2 99%   BMI 27.70 kg/m   Physical Exam   General appearance: alert, cooperative and appears stated age Head: Normocephalic, without obvious abnormality, atraumatic Eyes: conjunctivae/corneas clear. PERRL, EOM's intact. Fundi benign. Ears: normal TM's and external ear canals both ears Nose: Nares normal. Septum midline. Mucosa normal. No drainage or sinus tenderness. Throat: lips, mucosa, and tongue normal; teeth and gums normal Neck: no adenopathy, no carotid bruit, no JVD, supple, symmetrical, trachea midline and thyroid not enlarged, symmetric, no tenderness/mass/nodules Lungs: clear to auscultation bilaterally Breasts: normal appearance, no masses or tenderness Heart: regular rate and rhythm, S1,  S2 normal, no murmur, click, rub or gallop Abdomen: soft, non-tender; bowel sounds normal; no masses,  no organomegaly Extremities: extremities normal, atraumatic, no cyanosis or edema Pulses: 2+ and symmetric Skin: Skin color, texture, turgor normal. No rashes or lesions Neurologic: Alert and oriented X 3, normal strength and tone. Normal symmetric reflexes. Normal coordination and gait.      Assessment & Plan:   Problem List Items Addressed This Visit    Adult hypothyroidism    Secondary to thyroidectomy for thyroid ca.  Dose reduced today to 75 mcg for suppressed tesh  Lab Results  Component Value Date   TSH 0.328 (L) 10/14/2016         Relevant Medications   levothyroxine (SYNTHROID, LEVOTHROID) 75 MCG tablet   Other Relevant Orders   T4 AND TSH (Completed)   Encounter for preventive health examination    Annual comprehensive preventive exam was done as well as an evaluation and management of chronic conditions .  During the course of the visit the patient was educated and counseled anf brought upb to date about appropriate screening and preventive services including :  diabetes screening, lipid analysis with projected  10 year  risk for CAD , nutrition counseling, breast,  and colorectal cancer screening, and  recommended immunizations.  Printed recommendations for health maintenance screenings was given      H/O malignant neoplasm of thyroid    s/p thyroidectomy in 1993.  Addressed suppressed tsh on 88 mcg and recommended lowering her dose to 75 mcg dose.       HLD (hyperlipidemia)   Relevant Orders   Lipid panel (Completed)   Overweight (BMI 25.0-29.9)    I have addressed  BMI and recommended a low glycemic index diet utilizing smaller more frequent meals to increase metabolism.  I have also recommended that patient start exercising with a goal of 30 minutes of aerobic exercise a minimum of 5 days per week. Screening for lipid disorders, underactive thyroid and follow up  on prediabetes WAS DONE  today. Trial of Saxenda for longterm management was dicussed and will be initiated if the medication can be obtained at a reasonable cost.   Lab Results  Component Value Date   HGBA1C 6.1 10/14/2016   Lab Results  Component Value Date   CHOL 211 (H) 10/14/2016   HDL 69.20 10/14/2016   LDLCALC 122 (H) 10/14/2016   TRIG 95.0 10/14/2016   CHOLHDL 3 10/14/2016   Lab Results  Component Value Date   TSH 0.328 (L) 10/14/2016           Other Visit Diagnoses    Need for Tdap vaccination    -  Primary   Relevant Orders   Tdap vaccine greater than or equal to 7yo IM (Completed)   Weight gain       Relevant Orders   Comprehensive metabolic panel (Completed)   Vitamin D deficiency       Relevant Orders   VITAMIN D 25 Hydroxy (Vit-D Deficiency, Fractures) (Completed)   Other fatigue       Relevant Orders   CBC with Differential/Platelet (Completed)   Impaired fasting glucose       Relevant Orders   Hemoglobin A1c (Completed)      I have discontinued Ms. Monfort's cyclobenzaprine, ibuprofen, and omeprazole. I have also changed her SYNTHROID to levothyroxine. Additionally, I am having her start on montelukast. Lastly, I am having her maintain her Calcium, MAGNESIUM PO, Vitamin D3, Vitamin B12, fluticasone, celecoxib, temazepam, and Liraglutide -Weight Management.  Meds ordered this encounter  Medications  . montelukast (SINGULAIR) 10 MG tablet    Sig: Take 1 tablet (10 mg total) by mouth at bedtime.    Dispense:  30 tablet    Refill:  3  . DISCONTD: Liraglutide -Weight Management (SAXENDA) 18 MG/3ML SOPN    Sig: Inject 0.6 mg into the skin daily. Increase dose weekly as follows: Week 2: 1.2 mg daily ; Week 3: 1.8 mg daily; Week 4: 2.4 mg daily    Dispense:  9 mL    Refill:  0  . Liraglutide -Weight Management (SAXENDA) 18 MG/3ML SOPN    Sig: Inject 0.6 mg into the skin daily. Increase dose weekly as follows: Week 2: 1.2 mg daily ; Week 3: 1.8 mg daily;  Week 4: 2.4 mg daily    Dispense:  9 mL    Refill:  0  . levothyroxine (SYNTHROID, LEVOTHROID) 75 MCG tablet    Sig: Take 1 tablet (75 mcg total) by mouth daily.    Dispense:  90 tablet    Refill:  0    NOTE DOSE REDUCTION    Medications Discontinued During This Encounter  Medication Reason  . cyclobenzaprine (FLEXERIL) 10 MG tablet Patient has not taken in last 30  days  . ibuprofen (ADVIL,MOTRIN) 200 MG tablet Patient has not taken in last 30 days  . omeprazole (PRILOSEC) 40 MG capsule Patient has not taken in last 30 days  . Liraglutide -Weight Management (SAXENDA) 18 MG/3ML SOPN Reorder  . SYNTHROID 88 MCG tablet Reorder    Follow-up: No Follow-up on file.   Crecencio Mc, MD

## 2016-10-14 NOTE — Patient Instructions (Signed)
I am recommending use of the medication called Saxenda to help you lose weight.  It is similar to a a medicine that is used to treat diabetes called Victoza,  So It may lower your blood sugars .   It is injected daily in incrementally increasing doses (if tolerated,  Nausea usually resolves in a few days)"  0.6 mg daily   Week 1 1.2 mg daily Week 2 1.8 mg  [place] Week 3 2.4 mg daily Week 4 3.0 mg daily Week 5 and ongoing   If you want to bring the pen with you to be shown how to give yourself the dose,  We wil make you an  RN visit once you pick up your medication from your pharmacy     Health Maintenance for Postmenopausal Women Menopause is a normal process in which your reproductive ability comes to an end. This process happens gradually over a span of months to years, usually between the ages of 50 and 32. Menopause is complete when you have missed 12 consecutive menstrual periods. It is important to talk with your health care provider about some of the most common conditions that affect postmenopausal women, such as heart disease, cancer, and bone loss (osteoporosis). Adopting a healthy lifestyle and getting preventive care can help to promote your health and wellness. Those actions can also lower your chances of developing some of these common conditions. What should I know about menopause? During menopause, you may experience a number of symptoms, such as:  Moderate-to-severe hot flashes.  Night sweats.  Decrease in sex drive.  Mood swings.  Headaches.  Tiredness.  Irritability.  Memory problems.  Insomnia. Choosing to treat or not to treat menopausal changes is an individual decision that you make with your health care provider. What should I know about hormone replacement therapy and supplements? Hormone therapy products are effective for treating symptoms that are associated with menopause, such as hot flashes and night sweats. Hormone replacement carries certain risks,  especially as you become older. If you are thinking about using estrogen or estrogen with progestin treatments, discuss the benefits and risks with your health care provider. What should I know about heart disease and stroke? Heart disease, heart attack, and stroke become more likely as you age. This may be due, in part, to the hormonal changes that your body experiences during menopause. These can affect how your body processes dietary fats, triglycerides, and cholesterol. Heart attack and stroke are both medical emergencies. There are many things that you can do to help prevent heart disease and stroke:  Have your blood pressure checked at least every 1-2 years. High blood pressure causes heart disease and increases the risk of stroke.  If you are 74-61 years old, ask your health care provider if you should take aspirin to prevent a heart attack or a stroke.  Do not use any tobacco products, including cigarettes, chewing tobacco, or electronic cigarettes. If you need help quitting, ask your health care provider.  It is important to eat a healthy diet and maintain a healthy weight.  Be sure to include plenty of vegetables, fruits, low-fat dairy products, and lean protein.  Avoid eating foods that are high in solid fats, added sugars, or salt (sodium).  Get regular exercise. This is one of the most important things that you can do for your health.  Try to exercise for at least 150 minutes each week. The type of exercise that you do should increase your heart rate and make  you sweat. This is known as moderate-intensity exercise.  Try to do strengthening exercises at least twice each week. Do these in addition to the moderate-intensity exercise.  Know your numbers.Ask your health care provider to check your cholesterol and your blood glucose. Continue to have your blood tested as directed by your health care provider. What should I know about cancer screening? There are several types of  cancer. Take the following steps to reduce your risk and to catch any cancer development as early as possible. Breast Cancer  Practice breast self-awareness.  This means understanding how your breasts normally appear and feel.  It also means doing regular breast self-exams. Let your health care provider know about any changes, no matter how small.  If you are 73 or older, have a clinician do a breast exam (clinical breast exam or CBE) every year. Depending on your age, family history, and medical history, it may be recommended that you also have a yearly breast X-ray (mammogram).  If you have a family history of breast cancer, talk with your health care provider about genetic screening.  If you are at high risk for breast cancer, talk with your health care provider about having an MRI and a mammogram every year.  Breast cancer (BRCA) gene test is recommended for women who have family members with BRCA-related cancers. Results of the assessment will determine the need for genetic counseling and BRCA1 and for BRCA2 testing. BRCA-related cancers include these types:  Breast. This occurs in males or females.  Ovarian.  Tubal. This may also be called fallopian tube cancer.  Cancer of the abdominal or pelvic lining (peritoneal cancer).  Prostate.  Pancreatic. Cervical, Uterine, and Ovarian Cancer  Your health care provider may recommend that you be screened regularly for cancer of the pelvic organs. These include your ovaries, uterus, and vagina. This screening involves a pelvic exam, which includes checking for microscopic changes to the surface of your cervix (Pap test).  For women ages 21-65, health care providers may recommend a pelvic exam and a Pap test every three years. For women ages 22-65, they may recommend the Pap test and pelvic exam, combined with testing for human papilloma virus (HPV), every five years. Some types of HPV increase your risk of cervical cancer. Testing for HPV  may also be done on women of any age who have unclear Pap test results.  Other health care providers may not recommend any screening for nonpregnant women who are considered low risk for pelvic cancer and have no symptoms. Ask your health care provider if a screening pelvic exam is right for you.  If you have had past treatment for cervical cancer or a condition that could lead to cancer, you need Pap tests and screening for cancer for at least 20 years after your treatment. If Pap tests have been discontinued for you, your risk factors (such as having a new sexual partner) need to be reassessed to determine if you should start having screenings again. Some women have medical problems that increase the chance of getting cervical cancer. In these cases, your health care provider may recommend that you have screening and Pap tests more often.  If you have a family history of uterine cancer or ovarian cancer, talk with your health care provider about genetic screening.  If you have vaginal bleeding after reaching menopause, tell your health care provider.  There are currently no reliable tests available to screen for ovarian cancer. Lung Cancer  Lung cancer screening is  recommended for adults 36-48 years old who are at high risk for lung cancer because of a history of smoking. A yearly low-dose CT scan of the lungs is recommended if you:  Currently smoke.  Have a history of at least 30 pack-years of smoking and you currently smoke or have quit within the past 15 years. A pack-year is smoking an average of one pack of cigarettes per day for one year. Yearly screening should:  Continue until it has been 15 years since you quit.  Stop if you develop a health problem that would prevent you from having lung cancer treatment. Colorectal Cancer  This type of cancer can be detected and can often be prevented.  Routine colorectal cancer screening usually begins at age 40 and continues through age  42.  If you have risk factors for colon cancer, your health care provider may recommend that you be screened at an earlier age.  If you have a family history of colorectal cancer, talk with your health care provider about genetic screening.  Your health care provider may also recommend using home test kits to check for hidden blood in your stool.  A small camera at the end of a tube can be used to examine your colon directly (sigmoidoscopy or colonoscopy). This is done to check for the earliest forms of colorectal cancer.  Direct examination of the colon should be repeated every 5-10 years until age 45. However, if early forms of precancerous polyps or small growths are found or if you have a family history or genetic risk for colorectal cancer, you may need to be screened more often. Skin Cancer  Check your skin from head to toe regularly.  Monitor any moles. Be sure to tell your health care provider:  About any new moles or changes in moles, especially if there is a change in a mole's shape or color.  If you have a mole that is larger than the size of a pencil eraser.  If any of your family members has a history of skin cancer, especially at a young age, talk with your health care provider about genetic screening.  Always use sunscreen. Apply sunscreen liberally and repeatedly throughout the day.  Whenever you are outside, protect yourself by wearing long sleeves, pants, a wide-brimmed hat, and sunglasses. What should I know about osteoporosis? Osteoporosis is a condition in which bone destruction happens more quickly than new bone creation. After menopause, you may be at an increased risk for osteoporosis. To help prevent osteoporosis or the bone fractures that can happen because of osteoporosis, the following is recommended:  If you are 57-9 years old, get at least 1,000 mg of calcium and at least 600 mg of vitamin D per day.  If you are older than age 95 but younger than age 91,  get at least 1,200 mg of calcium and at least 600 mg of vitamin D per day.  If you are older than age 64, get at least 1,200 mg of calcium and at least 800 mg of vitamin D per day. Smoking and excessive alcohol intake increase the risk of osteoporosis. Eat foods that are rich in calcium and vitamin D, and do weight-bearing exercises several times each week as directed by your health care provider. What should I know about how menopause affects my mental health? Depression may occur at any age, but it is more common as you become older. Common symptoms of depression include:  Low or sad mood.  Changes in sleep  patterns.  Changes in appetite or eating patterns.  Feeling an overall lack of motivation or enjoyment of activities that you previously enjoyed.  Frequent crying spells. Talk with your health care provider if you think that you are experiencing depression. What should I know about immunizations? It is important that you get and maintain your immunizations. These include:  Tetanus, diphtheria, and pertussis (Tdap) booster vaccine.  Influenza every year before the flu season begins.  Pneumonia vaccine.  Shingles vaccine. Your health care provider may also recommend other immunizations. This information is not intended to replace advice given to you by your health care provider. Make sure you discuss any questions you have with your health care provider. Document Released: 08/19/2005 Document Revised: 01/15/2016 Document Reviewed: 03/31/2015 Elsevier Interactive Patient Education  2017 [employer].

## 2016-10-15 LAB — T4 AND TSH
T4 TOTAL: 11.3 ug/dL (ref 4.5–12.0)
TSH: 0.328 u[IU]/mL — ABNORMAL LOW (ref 0.450–4.500)

## 2016-10-16 ENCOUNTER — Encounter: Payer: Self-pay | Admitting: Internal Medicine

## 2016-10-16 DIAGNOSIS — Z0000 Encounter for general adult medical examination without abnormal findings: Secondary | ICD-10-CM | POA: Insufficient documentation

## 2016-10-16 MED ORDER — LEVOTHYROXINE SODIUM 75 MCG PO TABS: 75.0000 ug | ORAL_TABLET | Freq: Every day | ORAL | 0 refills | Status: DC

## 2016-10-16 NOTE — Assessment & Plan Note (Signed)
Secondary to thyroidectomy for thyroid ca.  Dose reduced today to 75 mcg for suppressed tesh  Lab Results  Component Value Date   TSH 0.328 (L) 10/14/2016

## 2016-10-16 NOTE — Assessment & Plan Note (Addendum)
Annual comprehensive preventive exam was done as well as an evaluation and management of chronic conditions .  During the course of the visit the patient was educated and counseled anf brought upb to date about appropriate screening and preventive services including :  diabetes screening, lipid analysis with projected  10 year  risk for CAD , nutrition counseling, breast,  and colorectal cancer screening, and recommended immunizations.  Printed recommendations for health maintenance screenings was given

## 2016-10-16 NOTE — Assessment & Plan Note (Signed)
Done during thyroidectomy in 1993.  Lab Results  Component Value Date   NA 137 10/14/2016   K 4.2 10/14/2016   CL 101 10/14/2016   CO2 29 10/14/2016

## 2016-10-16 NOTE — Assessment & Plan Note (Addendum)
I have addressed  BMI and recommended a low glycemic index diet utilizing smaller more frequent meals to increase metabolism.  I have also recommended that patient start exercising with a goal of 30 minutes of aerobic exercise a minimum of 5 days per week. Screening for lipid disorders, underactive thyroid and follow up on prediabetes WAS DONE  today. Trial of Saxenda for longterm management was dicussed and will be initiated if the medication can be obtained at a reasonable cost.   Lab Results  Component Value Date   HGBA1C 6.1 10/14/2016   Lab Results  Component Value Date   CHOL 211 (H) 10/14/2016   HDL 69.20 10/14/2016   LDLCALC 122 (H) 10/14/2016   TRIG 95.0 10/14/2016   CHOLHDL 3 10/14/2016   Lab Results  Component Value Date   TSH 0.328 (L) 10/14/2016

## 2016-10-16 NOTE — Assessment & Plan Note (Signed)
s/p thyroidectomy in 1993.  Addressed suppressed tsh on 88 mcg and recommended lowering her dose to 75 mcg dose.

## 2016-10-28 LAB — HM MAMMOGRAPHY

## 2016-11-22 ENCOUNTER — Telehealth: Payer: Self-pay | Admitting: Internal Medicine

## 2016-11-22 DIAGNOSIS — E039 Hypothyroidism, unspecified: Secondary | ICD-10-CM

## 2016-11-22 NOTE — Telephone Encounter (Signed)
Pt called wanting to have her Thyroid checked. Need order please and thank you!  Pt needs a refill for   Liraglutide -Weight Management ([place]) 18 MG/3ML [place] [id number] - [place], [place] would like to speak to nurse regarding how the medication is working.   Call pt @ [phone]. Thank you!

## 2016-11-24 ENCOUNTER — Telehealth: Payer: Self-pay

## 2016-11-24 ENCOUNTER — Telehealth: Payer: Self-pay | Admitting: *Deleted

## 2016-11-24 MED ORDER — LIRAGLUTIDE -WEIGHT MANAGEMENT 18 MG/3ML ~~LOC~~ SOPN: 0.6000 mg | PEN_INJECTOR | Freq: Every day | SUBCUTANEOUS | 0 refills | Status: DC

## 2016-11-24 NOTE — Telephone Encounter (Signed)
Resent rx to correct pharmacy. 

## 2016-11-24 NOTE — Telephone Encounter (Signed)
Patient has requested to have a medication refill for [place] on [address]  Abbott Pao contact [phone] Pt has been scheduled for labs on Monday for a Tyroid check, please advise if this is correct

## 2016-11-24 NOTE — Telephone Encounter (Signed)
Please advise.   Looks liek TSH was last drawn on 10/14/2016. Does she need to have it drawn again.

## 2016-11-24 NOTE — Telephone Encounter (Signed)
Lab Results  Component Value Date   TSH 0.328 (L) 10/14/2016    Yes, Her thyroid was overactive , so she is due for repeat .

## 2016-11-24 NOTE — Telephone Encounter (Signed)
LMTCB. Need to schedule pt a lab appt. Labs have been ordered.  

## 2016-11-24 NOTE — Telephone Encounter (Signed)
Spoke with pt and informed her that Dr. Derrel Nip had sent in her saxenda rx. Pt stated that it needed to be sent to [facility] on [address] Called [facility] and canceled the saxenda rx sent and resent [facility] to [facility] on [address]. Pt also stated that she already has an appt scheduled for Monday to have labs drawn.

## 2016-11-24 NOTE — Telephone Encounter (Signed)
I have refilled the [place] and the labs were correct, thanks

## 2016-11-26 ENCOUNTER — Other Ambulatory Visit: Payer: Self-pay | Admitting: Internal Medicine

## 2016-11-28 ENCOUNTER — Other Ambulatory Visit: Payer: Self-pay | Admitting: Internal Medicine

## 2016-11-28 ENCOUNTER — Other Ambulatory Visit (INDEPENDENT_AMBULATORY_CARE_PROVIDER_SITE_OTHER): Payer: No Typology Code available for payment source

## 2016-11-28 DIAGNOSIS — E039 Hypothyroidism, unspecified: Secondary | ICD-10-CM

## 2016-11-28 NOTE — Telephone Encounter (Signed)
[facility] stated that [facility] [address] [facility] not receive [facility]  Pt contact [phone]

## 2016-11-28 NOTE — Telephone Encounter (Signed)
Spoke with pt and let her know that I spoke with [facility] and they stated that they had just received the pt's rx and that they were getting ready to fill it.

## 2016-11-29 LAB — T4 AND TSH
T4 TOTAL: 9.9 ug/dL (ref 4.5–12.0)
TSH: 1.62 u[IU]/mL (ref 0.450–4.500)

## 2016-11-30 ENCOUNTER — Other Ambulatory Visit: Payer: Self-pay | Admitting: Internal Medicine

## 2016-12-01 ENCOUNTER — Encounter: Payer: Self-pay | Admitting: Internal Medicine

## 2016-12-01 ENCOUNTER — Other Ambulatory Visit: Payer: Self-pay | Admitting: Internal Medicine

## 2016-12-01 MED ORDER — LEVOTHYROXINE SODIUM 75 MCG PO TABS: 75.0000 ug | ORAL_TABLET | Freq: Every day | ORAL | 1 refills | Status: DC

## 2016-12-02 MED ORDER — LEVOTHYROXINE SODIUM 75 MCG PO TABS: 75.0000 ug | ORAL_TABLET | Freq: Every day | ORAL | 1 refills | Status: DC

## 2016-12-12 ENCOUNTER — Telehealth: Payer: Self-pay | Admitting: Internal Medicine

## 2016-12-12 NOTE — Telephone Encounter (Signed)
Pt called and wanted to ask some questions in regards to a bill from [facility] 10/14/16. Pt came in for a physical but also looks like she was billed for another office visit. Is there anything that we could recode. Her bill was for $430.00. Pt did try and call the billing department and got nothing but attitude and was referred to the office. Please advise, thank you!  Call pt @ [phone]

## 2017-01-13 ENCOUNTER — Ambulatory Visit: Payer: No Typology Code available for payment source | Admitting: Internal Medicine

## 2017-03-17 ENCOUNTER — Encounter: Payer: Self-pay | Admitting: Internal Medicine

## 2017-03-17 ENCOUNTER — Ambulatory Visit (INDEPENDENT_AMBULATORY_CARE_PROVIDER_SITE_OTHER): Payer: No Typology Code available for payment source | Admitting: Internal Medicine

## 2017-03-17 VITALS — BP 116/68 | HR 74 | Temp 98.4°F | Resp 16 | Wt 161.2 lb

## 2017-03-17 DIAGNOSIS — M7072 Other bursitis of hip, left hip: Secondary | ICD-10-CM | POA: Diagnosis not present

## 2017-03-17 DIAGNOSIS — E7800 Pure hypercholesterolemia, unspecified: Secondary | ICD-10-CM

## 2017-03-17 DIAGNOSIS — Z85850 Personal history of malignant neoplasm of thyroid: Secondary | ICD-10-CM | POA: Diagnosis not present

## 2017-03-17 DIAGNOSIS — E663 Overweight: Secondary | ICD-10-CM | POA: Diagnosis not present

## 2017-03-17 DIAGNOSIS — R7301 Impaired fasting glucose: Secondary | ICD-10-CM | POA: Diagnosis not present

## 2017-03-17 MED ORDER — NALTREXONE-BUPROPION HCL ER 8-90 MG PO TB12: ORAL_TABLET | ORAL | 0 refills | Status: DC

## 2017-03-17 NOTE — Patient Instructions (Addendum)
You can resume celebrex daily along with up to 2000 mg of tylenol safely  For joint and msk pain   Trial of Contrave for long term appetite suppression (if affordable; if not,  We can try palin wellbutrin)  Iliotibial Bursitis Iliotibial bursitis is inflammation of the bursa on the outside of the knee. A bursa is a fluid-filled sac that is often found near a joint. Bursas act as cushions to help tendons glide smoothly over bony surfaces during joint movement. The iliotibial bursa is located beneath a long tendon (iliotibial band) that connects muscles of the buttock, hip, and upper leg to the outside of the shin bone. This condition is also called iliotibial band friction syndrome. What are the causes? This condition is caused by repeated rubbing of the tendon over the bursa, which occurs with repetitive activity. This friction causes fluid to build up inside the bursa. The bursa swells, and that causes pain in the area where it is located. What increases the risk? The following factors may make you more likely to develop this condition:  Doing athletic activities that involve repetitive squatting, running, cutting, and side-to-side movements.  Overtraining, or starting a new athletic activity without gradually increasing your time and distance.  Participating in certain sports, such as: ? Basketball. ? Cross country running. ? Football. ? Rugby. ? Racquet sports. ? Soccer. ? Volleyball. ? Cycling.  Being 71-58 years old and having knee arthritis.  Being a middle-aged woman who is overweight.  Having flat feet or knee deformities.  Having diabetes.  What are the signs or symptoms? Symptoms of this condition include:  Pain on the outside of your knee. The pain may also be felt on the outside of your leg near the knee.  Pain that gets worse with activity, especially stairs or prolonged walking or running.  Tenderness when pressing on the side of your knee.  Knee swelling that  may or may not include increased warmth or redness.  How is this diagnosed? This condition is usually diagnosed based on your symptoms, your medical history, and a physical exam. During the exam, your health care provider will check your:  Knee motion.  Knee strength.  Amount of pain when the outside of your knee is touched or pressed on.  Ability to do activities such as walking or stairs.  Rarely, other tests may be done to rule out other causes of your symptoms. These tests may include:  MRI.  Ultrasound.  An injection of numbing medicine into the bursa to see if the pain will go away.  How is this treated? Treatment for this condition may include:  Avoiding activities that cause pain and swelling.  Icing your knee.  Wearing an elastic wrap or sleeve to support your knee.  Keeping your knee raised (elevated) when resting.  Taking an NSAID to reduce pain and swelling.  Getting injected with a numbing medicine and an anti-inflammatory medicine (steroid).  Doing stretching and strengthening exercises.  Treatment usually improves the pain in 6-8 weeks. Surgery is sometimes needed to drain or remove the bursa. Follow these instructions at home: If you have a compression wrap or sleeve:  Wear it as told by your health care provider. Remove it only as told by your health care provider.  Loosen the wrap or sleeve if your foot or toes tingle, become numb, or turn cold and blue.  Do not let the wrap or sleeve get wet.  Keep the wrap or sleeve clean. Managing pain, stiffness, and  swelling   If directed, put ice on the knee. ? Put ice in a plastic bag. ? Place a towel between your skin and the bag. ? Leave the ice on for 20 minutes, 2-3 times a day.  Elevate your leg above the level of your heart while you are sitting or lying down. Activity  Return to your normal activities as told by your health care provider. Ask your health care provider what activities are safe  for you.  Do exercises as told by your health care provider. General instructions  Take over-the-counter and prescription medicines only as told by your health care provider.  Keep all follow-up visits as told by your health care provider. This is important. How is this prevented?  Warm up and stretch before being active.  Cool down and stretch after being active.  Give your body time to rest between periods of activity.  Make sure to use equipment that fits you.  Maintain physical fitness, including: ? Strength. ? Flexibility. Contact a health care provider if:  You have pain that is not relieved by rest or treatment.  Your symptoms get worse or do not improve with home care. This information is not intended to replace advice given to you by your health care provider. Make sure you discuss any questions you have with your health care provider. Document Released: 06/27/2005 Document Revised: 03/03/2016 Document Reviewed: 06/09/2015 Elsevier Interactive Patient Education  [employer].

## 2017-03-17 NOTE — Progress Notes (Signed)
Subjective:  Patient ID: Emily Arellano, female    DOB: September 16, 1955  Age: 61 y.o. MRN: [id number]  CC: The primary encounter diagnosis was H/O malignant neoplasm of thyroid. Diagnoses of Pure hypercholesterolemia, Impaired fasting glucose, Overweight (BMI 25.0-29.9), and Iliopsoas bursitis of left hip were also pertinent to this visit.  HPI MAREESA GATHRIGHT presents for follow up on pharmacotherapy for overweight.  Trial of saenda was initiated in April  And she has had  a 10 lb weight loss since then . However the cost is $700/month  Discussed alternative medications   Left lateral hip pain   No bump appreciated,  Massages regularly transient relief .  Notices when ascending steps  And walking ,  Nagging pain.  stopped celebrex due to fear of liver issues,  But the medication was helping .  lies on left side,  Does not hurt to lie on the left side  Has recurrent severe headaches relieved only by motrin  Has a cervical spur on the left that is triggered into flaring by lifting grandkids and other heavy objects     Outpatient Medications Prior to Visit  Medication Sig Dispense Refill  . Calcium 150 MG TABS Take 2 tablets by mouth at bedtime.    . Cholecalciferol (VITAMIN D3) 2000 UNITS capsule Take by mouth.    . Cyanocobalamin (VITAMIN B12) 3000 MCG/ML LIQD Place under the tongue.    . fluticasone (FLONASE) 50 MCG/ACT nasal spray Place into the nose.    . levothyroxine (SYNTHROID, LEVOTHROID) 75 MCG tablet Take 1 tablet (75 mcg total) by mouth daily. Must be brand name synthroid 90 tablet 1  . MAGNESIUM PO Take 3-4 tablets by mouth at bedtime.    . temazepam (RESTORIL) 30 MG capsule TAKE 1 CAPSULE BY MOUTH AT BEDTIME AS NEEDED FOR SLEEP 30 capsule 3  . celecoxib (CELEBREX) 400 MG capsule Take 1 capsule (400 mg total) by mouth daily after breakfast. (Patient not taking: Reported on 03/17/2017) 30 capsule 5  . montelukast (SINGULAIR) 10 MG tablet Take 1 tablet (10 mg total) by mouth at bedtime.  (Patient not taking: Reported on 03/17/2017) 30 tablet 3  . SAXENDA 18 MG/3ML SOPN INJECT 0.6MG  INTO SKIN EVERY DAY WEEK 1 THEN 1.2MG  WEEK 2, 1.8MG  WEEK 3 AND 2.4MG  WEEK 4 (Patient not taking: Reported on 03/17/2017) 15 pen 0   No facility-administered medications prior to visit.     Review of Systems;  Patient denies headache, fevers, malaise, unintentional weight loss, skin rash, eye pain, sinus congestion and sinus pain, sore throat, dysphagia,  hemoptysis , cough, dyspnea, wheezing, chest pain, palpitations, orthopnea, edema, abdominal pain, nausea, melena, diarrhea, constipation, flank pain, dysuria, hematuria, urinary  Frequency, nocturia, numbness, tingling, seizures,  Focal weakness, Loss of consciousness,  Tremor, insomnia, depression, anxiety, and suicidal ideation.      Objective:  BP 116/68   Pulse 74   Temp 98.4 F (36.9 C) (Oral)   Resp 16   Wt 161 lb 3.2 oz (73.1 kg)   SpO2 98%   BMI 26.02 kg/m   BP Readings from Last 3 Encounters:  03/17/17 116/68  10/14/16 116/78  04/29/16 128/78    Wt Readings from Last 3 Encounters:  03/17/17 161 lb 3.2 oz (73.1 kg)  10/14/16 171 lb 9.6 oz (77.8 kg)  04/29/16 168 lb 12 oz (76.5 kg)    General appearance: alert, cooperative and appears stated age Ears: normal TM's and external ear canals both ears Throat: lips, mucosa, and tongue  normal; teeth and gums normal Neck: no adenopathy, no carotid bruit, supple, symmetrical, trachea midline and thyroid not enlarged, symmetric, no tenderness/mass/nodules Back: symmetric, no curvature. ROM normal. No CVA tenderness. Lungs: clear to auscultation bilaterally Heart: regular rate and rhythm, S1, S2 normal, no murmur, click, rub or gallop Abdomen: soft, non-tender; bowel sounds normal; no masses,  no organomegaly Pulses: 2+ and symmetric Skin: Skin color, texture, turgor normal. No rashes or lesions Lymph nodes: Cervical, supraclavicular, and axillary nodes normal.  Lab Results    Component Value Date   HGBA1C 6.1 10/14/2016   HGBA1C 5.8 (H) 02/06/2015    Lab Results  Component Value Date   CREATININE 0.86 10/14/2016   CREATININE 0.89 10/01/2015   CREATININE 0.73 02/06/2015    Lab Results  Component Value Date   WBC 4.1 10/14/2016   HGB 13.5 10/14/2016   HCT 41.7 10/14/2016   PLT 346.0 10/14/2016   GLUCOSE 100 (H) 10/14/2016   CHOL 211 (H) 10/14/2016   TRIG 95.0 10/14/2016   HDL 69.20 10/14/2016   LDLCALC 122 (H) 10/14/2016   ALT 17 10/14/2016   AST 18 10/14/2016   NA 137 10/14/2016   K 4.2 10/14/2016   CL 101 10/14/2016   CREATININE 0.86 10/14/2016   BUN 18 10/14/2016   CO2 29 10/14/2016   TSH 1.620 11/28/2016   HGBA1C 6.1 10/14/2016    Mr Cervical Spine Wo Contrast  Result Date: 05/19/2016 CLINICAL DATA:  Bilateral neck pain with radiculopathy EXAM: MRI CERVICAL SPINE WITHOUT CONTRAST TECHNIQUE: Multiplanar, multisequence MR imaging of the cervical spine was performed. No intravenous contrast was administered. COMPARISON:  03/02/2005 FINDINGS: Alignment: Mild reversal of lordosis. Borderline C3-4 anterolisthesis and C6-7 retrolisthesis. Vertebrae: No fracture, evidence of discitis, or bone lesion. Cord: Normal signal and morphology. Posterior Fossa, vertebral arteries, paraspinal tissues: Negative. Disc levels: C2-3: Minimal facet spurring on the left.  No impingement. C3-4: Mild facet spurring on the right. Borderline anterolisthesis. Mild disc narrowing and desiccation. No herniation or impingement C4-5: Disc narrowing and desiccation with uncovertebral ridging that is mild. Negative facets. No impingement C5-6: Greatest level of degenerative disc narrowing with endplate and uncovertebral ridging. Canal and foramina remain patent without compressive stenosis. C6-7: Degenerative disc narrowing with endplate and uncovertebral ridging. Negative facets. Mild left foraminal narrowing C7-T1:Unremarkable. IMPRESSION: 1. Lower cervical disc degeneration with  mild progression from 2006. 2. Widely patent spinal canal. 3. Uncovertebral ridging at C5-6 and C6-7 with up to mild left foraminal narrowing at C6-7. Electronically Signed   By: Monte Fantasia M.D.   On: 05/19/2016 09:58    Assessment & Plan:   Problem List Items Addressed This Visit    Bursitis of left hip    Advised to resume celebrex since it was helping      H/O malignant neoplasm of thyroid - Primary   Relevant Orders   TSH   CBC with Differential/Platelet   HLD (hyperlipidemia)   Relevant Orders   Lipid panel   Overweight (BMI 25.0-29.9)    She had success with saxenda but the cost of $700 .for 3 months is prohibitive.  Discussed alternatives including Contrave        Other Visit Diagnoses    Impaired fasting glucose       Relevant Orders   Hemoglobin A1c   Comprehensive metabolic panel     A total of 25 minutes of face to face time was spent with patient more than half of which was spent in counselling about the above mentioned  conditions  and coordination of care   I am having Ms. Vanecek maintain her Calcium, MAGNESIUM PO, Vitamin D3, Vitamin B12, fluticasone, celecoxib, montelukast, SAXENDA, temazepam, levothyroxine, ELIDEL, and Naltrexone-Bupropion HCl ER.  Meds ordered this encounter  Medications  . ELIDEL 1 % cream    Sig: APPLY TO THE AFFECTED AREA ON THE SKIN EVERY DAY AS DIRECTED    Refill:  3  . DISCONTD: Naltrexone-Bupropion HCl ER 8-90 MG TB12    Sig: One tablet every morning for one week, then twice daily for one week.. Increase gradually to 2 tablets twice daily    Dispense:  120 tablet    Refill:  0  . Naltrexone-Bupropion HCl ER 8-90 MG TB12    Sig: One tablet every morning for one week, then twice daily for one week.. Increase gradually to 2 tablets twice daily    Dispense:  120 tablet    Refill:  0    Medications Discontinued During This Encounter  Medication Reason  . Naltrexone-Bupropion HCl ER 8-90 MG TB12 Reorder    Follow-up: No  Follow-up on file.   Crecencio Mc, MD

## 2017-03-19 DIAGNOSIS — M7072 Other bursitis of hip, left hip: Secondary | ICD-10-CM | POA: Insufficient documentation

## 2017-03-19 NOTE — Assessment & Plan Note (Signed)
Advised to resume celebrex since it was helping

## 2017-03-19 NOTE — Assessment & Plan Note (Signed)
She had success with saxenda but the cost of $700 .for 3 months is prohibitive.  Discussed alternatives including Contrave

## 2017-03-23 ENCOUNTER — Other Ambulatory Visit: Payer: Self-pay | Admitting: Internal Medicine

## 2017-03-24 ENCOUNTER — Other Ambulatory Visit: Payer: Self-pay | Admitting: Internal Medicine

## 2017-03-25 LAB — COMPREHENSIVE METABOLIC PANEL
ALK PHOS: 76 IU/L (ref 39–117)
ALT: 19 IU/L (ref 0–32)
AST: 21 IU/L (ref 0–40)
Albumin/Globulin Ratio: 2.2 (ref 1.2–2.2)
Albumin: 4.8 g/dL (ref 3.5–5.5)
BUN/Creatinine Ratio: 16 (ref 9–23)
BUN: 15 mg/dL (ref 6–24)
Bilirubin Total: 0.3 mg/dL (ref 0.0–1.2)
CO2: 24 mmol/L (ref 20–29)
CREATININE: 0.92 mg/dL (ref 0.57–1.00)
Calcium: 9.3 mg/dL (ref 8.7–10.2)
Chloride: 102 mmol/L (ref 96–106)
GFR calc Af Amer: 79 mL/min/{1.73_m2} (ref >59)
GFR calc non Af Amer: 68 mL/min/{1.73_m2} (ref >59)
Globulin, Total: 2.2 g/dL (ref 1.5–4.5)
Glucose: 110 mg/dL — ABNORMAL HIGH (ref 65–99)
Potassium: 4.9 mmol/L (ref 3.5–5.2)
Sodium: 142 mmol/L (ref 134–144)
Total Protein: 7 g/dL (ref 6.0–8.5)

## 2017-03-25 LAB — LIPID PANEL
CHOLESTEROL TOTAL: 197 mg/dL (ref 100–199)
Chol/HDL Ratio: 3.2 ratio (ref 0.0–4.4)
HDL: 61 mg/dL (ref >39)
LDL CALC: 109 mg/dL — AB (ref 0–99)
TRIGLYCERIDES: 137 mg/dL (ref 0–149)
VLDL CHOLESTEROL CAL: 27 mg/dL (ref 5–40)

## 2017-03-25 LAB — CBC WITH DIFFERENTIAL/PLATELET
BASOS ABS: 0 10*3/uL (ref 0.0–0.2)
Basos: 1 %
EOS (ABSOLUTE): 0.2 10*3/uL (ref 0.0–0.4)
Eos: 4 %
HEMOGLOBIN: 13.8 g/dL (ref 11.1–15.9)
Hematocrit: 41 % (ref 34.0–46.6)
IMMATURE GRANULOCYTES: 0 %
Immature Grans (Abs): 0 10*3/uL (ref 0.0–0.1)
LYMPHS ABS: 1.2 10*3/uL (ref 0.7–3.1)
Lymphs: 32 %
MCH: 29.4 pg (ref 26.6–33.0)
MCHC: 33.7 g/dL (ref 31.5–35.7)
MCV: 87 fL (ref 79–97)
MONOCYTES: 7 %
MONOS ABS: 0.3 10*3/uL (ref 0.1–0.9)
NEUTROS PCT: 56 %
Neutrophils Absolute: 2.2 10*3/uL (ref 1.4–7.0)
Platelets: 369 10*3/uL (ref 150–379)
RBC: 4.7 x10E6/uL (ref 3.77–5.28)
RDW: 13.5 % (ref 12.3–15.4)
WBC: 3.9 10*3/uL (ref 3.4–10.8)

## 2017-03-25 LAB — TSH: TSH: 2.2 u[IU]/mL (ref 0.450–4.500)

## 2017-03-25 LAB — HEMOGLOBIN A1C
Est. average glucose Bld gHb Est-mCnc: 120 mg/dL
Hgb A1c MFr Bld: 5.8 % — ABNORMAL HIGH (ref 4.8–5.6)

## 2017-03-26 ENCOUNTER — Encounter: Payer: Self-pay | Admitting: Internal Medicine

## 2017-03-30 ENCOUNTER — Other Ambulatory Visit: Payer: Self-pay | Admitting: Internal Medicine

## 2017-03-30 NOTE — Telephone Encounter (Signed)
Last OV 03/17/2017 Next OV 06/23/2017 Last refill 12/01/2016

## 2017-03-31 NOTE — Telephone Encounter (Signed)
Ok to call in if running out   rx authorized for #30 with 5 refills

## 2017-03-31 NOTE — Telephone Encounter (Signed)
Left message with patient to see how many pills she has left

## 2017-04-03 NOTE — Telephone Encounter (Signed)
Printed singed and faxed.

## 2017-05-05 ENCOUNTER — Encounter: Payer: Self-pay | Admitting: Internal Medicine

## 2017-05-05 MED ORDER — PHENDIMETRAZINE TARTRATE 35 MG PO TABS: 1.0000 | ORAL_TABLET | Freq: Three times a day (TID) | ORAL | 2 refills | Status: DC

## 2017-06-20 ENCOUNTER — Other Ambulatory Visit: Payer: Self-pay | Admitting: Internal Medicine

## 2017-06-23 ENCOUNTER — Ambulatory Visit: Payer: No Typology Code available for payment source | Admitting: Internal Medicine

## 2017-08-12 ENCOUNTER — Other Ambulatory Visit: Payer: Self-pay | Admitting: Internal Medicine

## 2017-09-27 ENCOUNTER — Other Ambulatory Visit: Payer: Self-pay | Admitting: Internal Medicine

## 2017-09-28 NOTE — Telephone Encounter (Signed)
Please notify patient that the prescription  was Refilled for 30 days only because it is a controlled substance and she needs to have an OFFICE VISIT  prior to any more refills

## 2017-09-28 NOTE — Telephone Encounter (Signed)
Last OV 03/17/17 last filled 03/31/17 30 5rf

## 2017-09-29 NOTE — Telephone Encounter (Signed)
Please schedule

## 2017-10-03 NOTE — Telephone Encounter (Signed)
Pt is scheduled for 10/06/2017

## 2017-10-06 ENCOUNTER — Other Ambulatory Visit: Payer: Self-pay | Admitting: Internal Medicine

## 2017-10-06 ENCOUNTER — Ambulatory Visit (INDEPENDENT_AMBULATORY_CARE_PROVIDER_SITE_OTHER): Payer: Self-pay | Admitting: Internal Medicine

## 2017-10-06 DIAGNOSIS — E7801 Familial hypercholesterolemia: Secondary | ICD-10-CM

## 2017-10-06 DIAGNOSIS — E663 Overweight: Secondary | ICD-10-CM

## 2017-10-06 DIAGNOSIS — Z85850 Personal history of malignant neoplasm of thyroid: Secondary | ICD-10-CM

## 2017-10-06 DIAGNOSIS — R7301 Impaired fasting glucose: Secondary | ICD-10-CM

## 2017-10-06 DIAGNOSIS — E039 Hypothyroidism, unspecified: Secondary | ICD-10-CM

## 2017-10-06 DIAGNOSIS — E538 Deficiency of other specified B group vitamins: Secondary | ICD-10-CM

## 2017-10-06 MED ORDER — PHENTERMINE HCL 37.5 MG PO TABS: 37.5000 mg | ORAL_TABLET | Freq: Every day | ORAL | 2 refills | Status: DC

## 2017-10-06 NOTE — Progress Notes (Unsigned)
PHENTERMINE PRINTED AND SIGNED,  NEED [facility] [employer] ORDERS PRINTED

## 2017-10-08 ENCOUNTER — Encounter: Payer: Self-pay | Admitting: Internal Medicine

## 2017-10-08 NOTE — Progress Notes (Signed)
Subjective:  Patient ID: Emily Arellano, female    DOB: 1956/05/18  Age: 62 y.o. MRN: [id number]  CC: The encounter diagnosis was Overweight (BMI 25.0-29.9).  HPI [name] presents for consultation on weight gain. She did not tolerate Contrave due to increased agitation and has continued to gain weight.    She has been taking Bontril for the last several months and  exercising regularly,  and trying to restrict her diet,  but has a difficult time in the afternoons and evenings.   Kirke Shaggy was not covered by her insurance and the out of pocket cost was $1200 per month.   She has had previous success with phentermine and is requesting a retrial.   Outpatient Medications Prior to Visit  Medication Sig Dispense Refill  . Calcium 150 MG TABS Take 2 tablets by mouth at bedtime.    . celecoxib (CELEBREX) 200 MG capsule TAKE 2 CAPSULES BY MOUTH DAILY AFTER BREAKFAST 60 capsule 2  . celecoxib (CELEBREX) 400 MG capsule Take 1 capsule (400 mg total) by mouth daily after breakfast. (Patient not taking: Reported on 03/17/2017) 30 capsule 5  . Cholecalciferol (VITAMIN D3) 2000 UNITS capsule Take by mouth.    . Cyanocobalamin (VITAMIN B12) 3000 MCG/ML LIQD Place under the tongue.    Marland Kitchen ELIDEL 1 % cream APPLY TO THE AFFECTED AREA ON THE SKIN EVERY DAY AS DIRECTED  3  . fluticasone (FLONASE) 50 MCG/ACT nasal spray Place into the nose.    Marland Kitchen MAGNESIUM PO Take 3-4 tablets by mouth at bedtime.    . montelukast (SINGULAIR) 10 MG tablet Take 1 tablet (10 mg total) by mouth at bedtime. (Patient not taking: Reported on 03/17/2017) 30 tablet 3  . Phendimetrazine Tartrate 35 MG TABS Take 1 tablet (35 mg total) by mouth 3 (three) times daily before meals. 90 tablet 2  . phentermine (ADIPEX-P) 37.5 MG tablet Take 1 tablet (37.5 mg total) by mouth daily before breakfast. 30 tablet 2  . SYNTHROID 75 MCG tablet TAKE ONE TABLET BY MOUTH DAILY 90 tablet 1  . temazepam (RESTORIL) 30 MG capsule TAKE 1 CAPSULE BY MOUTH AT  BEDTIME AS NEEDED FOR SLEEP 30 capsule 0  . Naltrexone-Bupropion HCl ER 8-90 MG TB12 One tablet every morning for one week, then twice daily for one week.. Increase gradually to 2 tablets twice daily 120 tablet 0  . SAXENDA 18 MG/3ML SOPN INJECT 0.6MG  INTO SKIN EVERY DAY WEEK 1 THEN 1.2MG  WEEK 2, 1.8MG  WEEK 3 AND 2.4MG  WEEK 4 (Patient not taking: Reported on 03/17/2017) 15 pen 0   No facility-administered medications prior to visit.     Review of Systems;  Patient denies headache, fevers, malaise, unintentional weight loss, skin rash, eye pain, sinus congestion and sinus pain, sore throat, dysphagia,  hemoptysis , cough, dyspnea, wheezing, chest pain, palpitations, orthopnea, edema, abdominal pain, nausea, melena, diarrhea, constipation, flank pain, dysuria, hematuria, urinary  Frequency, nocturia, numbness, tingling, seizures,  Focal weakness, Loss of consciousness,  Tremor, insomnia, depression, anxiety, and suicidal ideation.      Objective:  BP 130/88 (BP Location: Left Arm, Patient Position: Sitting, Cuff Size: Normal)   BP Readings from Last 3 Encounters:  10/06/17 130/88  03/17/17 116/68  10/14/16 116/78    Wt Readings from Last 3 Encounters:  03/17/17 161 lb 3.2 oz (73.1 kg)  10/14/16 171 lb 9.6 oz (77.8 kg)  04/29/16 168 lb 12 oz (76.5 kg)    General appearance: alert, cooperative and appears stated age  Lungs: clear to auscultation bilaterally Heart: regular rate and rhythm, S1, S2 normal, no murmur, click, rub or gallop Abdomen: soft, non-tender; bowel sounds normal; no masses,  no organomegaly Pulses: 2+ and symmetric Skin: Skin color, texture, turgor normal. No rashes or lesions Lymph nodes: Cervical, supraclavicular, and axillary nodes normal.  Lab Results  Component Value Date   HGBA1C 5.8 (H) 03/24/2017   HGBA1C 6.1 10/14/2016   HGBA1C 5.8 (H) 02/06/2015    Lab Results  Component Value Date   CREATININE 0.92 03/24/2017   CREATININE 0.86 10/14/2016    CREATININE 0.89 10/01/2015    Lab Results  Component Value Date   WBC 3.9 03/24/2017   HGB 13.8 03/24/2017   HCT 41.0 03/24/2017   PLT 369 03/24/2017   GLUCOSE 110 (H) 03/24/2017   CHOL 197 03/24/2017   TRIG 137 03/24/2017   HDL 61 03/24/2017   LDLCALC 109 (H) 03/24/2017   ALT 19 03/24/2017   AST 21 03/24/2017   NA 142 03/24/2017   K 4.9 03/24/2017   CL 102 03/24/2017   CREATININE 0.92 03/24/2017   BUN 15 03/24/2017   CO2 24 03/24/2017   TSH 2.200 03/24/2017   HGBA1C 5.8 (H) 03/24/2017    Assessment & Plan:   Problem List Items Addressed This Visit    Overweight (BMI 25.0-29.9)    Trial of phentermine , given lack of available alternatives. Rechecking thyroid function.         A total of 15 minutes of face to face time was spent with patient more than half of which was spent in counselling about the above mentioned conditions  and coordination of care  I have discontinued Mone J. Groseclose's SAXENDA and Naltrexone-buPROPion HCl ER. I am also having her maintain her Calcium, MAGNESIUM PO, Vitamin D3, Vitamin B12, fluticasone, celecoxib, montelukast, ELIDEL, Phendimetrazine Tartrate, SYNTHROID, celecoxib, temazepam, and phentermine.  No orders of the defined types were placed in this encounter.   Medications Discontinued During This Encounter  Medication Reason  . Naltrexone-Bupropion HCl ER 8-90 MG TB12   . SAXENDA 18 MG/3ML SOPN     Follow-up: No follow-ups on file.   Crecencio Mc, MD

## 2017-10-08 NOTE — Assessment & Plan Note (Addendum)
Trial of phentermine , given lack of available alternatives. Rechecking thyroid function.

## 2017-10-09 ENCOUNTER — Encounter: Payer: Self-pay | Admitting: Internal Medicine

## 2017-11-03 ENCOUNTER — Other Ambulatory Visit: Payer: Self-pay | Admitting: Internal Medicine

## 2017-11-03 LAB — HM MAMMOGRAPHY

## 2017-11-06 NOTE — Telephone Encounter (Signed)
Refilled   Not sure which pharmacy?

## 2017-11-07 NOTE — Telephone Encounter (Signed)
Printed, signed and faxed.  

## 2017-11-08 ENCOUNTER — Other Ambulatory Visit: Payer: Self-pay | Admitting: Internal Medicine

## 2018-01-05 ENCOUNTER — Other Ambulatory Visit: Payer: Self-pay | Admitting: Internal Medicine

## 2018-01-08 NOTE — Telephone Encounter (Signed)
Refilled: 10/06/2017 Last OV: 10/06/2017 Next OV: not scheduled

## 2018-01-09 NOTE — Telephone Encounter (Signed)
LMTCB. Need to schedule pt a follow up appt within the next month in order to get anymore refills on phentermine. [facility] may speak with pt.

## 2018-01-15 ENCOUNTER — Other Ambulatory Visit: Payer: Self-pay | Admitting: Internal Medicine

## 2018-01-16 NOTE — Telephone Encounter (Signed)
appt scheduled for 01/23/2018

## 2018-01-23 ENCOUNTER — Ambulatory Visit: Payer: No Typology Code available for payment source | Admitting: Internal Medicine

## 2018-01-23 ENCOUNTER — Encounter: Payer: Self-pay | Admitting: Internal Medicine

## 2018-01-23 VITALS — BP 138/74 | HR 64 | Temp 98.0°F | Resp 14 | Ht 66.0 in | Wt 161.6 lb

## 2018-01-23 DIAGNOSIS — Z85850 Personal history of malignant neoplasm of thyroid: Secondary | ICD-10-CM | POA: Diagnosis not present

## 2018-01-23 DIAGNOSIS — R7301 Impaired fasting glucose: Secondary | ICD-10-CM | POA: Diagnosis not present

## 2018-01-23 DIAGNOSIS — E663 Overweight: Secondary | ICD-10-CM

## 2018-01-23 DIAGNOSIS — Z1211 Encounter for screening for malignant neoplasm of colon: Secondary | ICD-10-CM

## 2018-01-23 DIAGNOSIS — E7801 Familial hypercholesterolemia: Secondary | ICD-10-CM

## 2018-01-23 DIAGNOSIS — E538 Deficiency of other specified B group vitamins: Secondary | ICD-10-CM

## 2018-01-23 DIAGNOSIS — K2100 Gastro-esophageal reflux disease with esophagitis, without bleeding: Secondary | ICD-10-CM

## 2018-01-23 DIAGNOSIS — E039 Hypothyroidism, unspecified: Secondary | ICD-10-CM

## 2018-01-23 DIAGNOSIS — E78019 Familial hypercholesterolemia, unspecified: Secondary | ICD-10-CM

## 2018-01-23 NOTE — Progress Notes (Signed)
Subjective:  Patient ID: Emily Arellano, female    DOB: 22-Dec-1955  Age: 62 y.o. MRN: [id number]  CC: The primary encounter diagnosis was Colon cancer screening. Diagnoses of H/O malignant neoplasm of thyroid, Impaired fasting glucose, Adult hypothyroidism, Familial hypercholesterolemia, B12 deficiency, Overweight (BMI 25.0-29.9), and Gastroesophageal reflux disease with esophagitis were also pertinent to this visit.  HPI Emily DAYLEY presents for follow up on overweight managed with medicaiton   Last seen in SEptember.  Weight unchanged despite taking phentiramine for the past 9 months.  However,   Joined Weight Watchers 6 weeks ago and has lost 7 lbs .  Exercising is just walking at night .     2) GERD symptoms  started last week .  Felt like a lump in throat.    Voice more hoarse'  has had  ENT eval in the past for same.  Had Thyroid surgery and XRT in 1993.  Symptoms improving since starting PPI    Outpatient Medications Prior to Visit  Medication Sig Dispense Refill  . Calcium 150 MG TABS Take 2 tablets by mouth at bedtime.    . Cholecalciferol (VITAMIN D3) 2000 UNITS capsule Take by mouth.    . Cyanocobalamin (VITAMIN B12) 3000 MCG/ML LIQD Place under the tongue.    Marland Kitchen ELIDEL 1 % cream APPLY TO THE AFFECTED AREA ON THE SKIN EVERY DAY AS DIRECTED  3  . fluticasone (FLONASE) 50 MCG/ACT nasal spray Place into the nose.    Marland Kitchen MAGNESIUM PO Take 3-4 tablets by mouth at bedtime.    . montelukast (SINGULAIR) 10 MG tablet Take 1 tablet (10 mg total) by mouth at bedtime. 30 tablet 3  . SYNTHROID 75 MCG tablet TAKE ONE TABLET BY MOUTH EVERY DAY 90 tablet 0  . temazepam (RESTORIL) 30 MG capsule TAKE 1 CAPSULE BY MOUTH AT BEDTIME AS NEEDED FOR SLEEP 30 capsule 5  . phentermine (ADIPEX-P) 37.5 MG tablet TAKE ONE TABLET BY MOUTH DAILY BEFORE BREAKFAST 30 tablet 0  . celecoxib (CELEBREX) 200 MG capsule TAKE 2 CAPSULES BY MOUTH EVERY DAY AFTERBREAKFAST (Patient not taking: Reported on 01/23/2018)  60 capsule 2  . celecoxib (CELEBREX) 400 MG capsule Take 1 capsule (400 mg total) by mouth daily after breakfast. (Patient not taking: Reported on 03/17/2017) 30 capsule 5  . Phendimetrazine Tartrate 35 MG TABS Take 1 tablet (35 mg total) by mouth 3 (three) times daily before meals. (Patient not taking: Reported on 01/23/2018) 90 tablet 2   No facility-administered medications prior to visit.     Review of Systems;  Patient denies headache, fevers, malaise, unintentional weight loss, skin rash, eye pain, sinus congestion and sinus pain, sore throat, dysphagia,  hemoptysis , cough, dyspnea, wheezing, chest pain, palpitations, orthopnea, edema, abdominal pain, nausea, melena, diarrhea, constipation, flank pain, dysuria, hematuria, urinary  Frequency, nocturia, numbness, tingling, seizures,  Focal weakness, Loss of consciousness,  Tremor, insomnia, depression, anxiety, and suicidal ideation.      Objective:  BP 138/74 (BP Location: Left Arm, Patient Position: Sitting, Cuff Size: Normal)   Pulse 64   Temp 98 F (36.7 C) (Oral)   Resp 14   Ht 5\' 6"  (1.676 m)   Wt 161 lb 9.6 oz (73.3 kg)   SpO2 97%   BMI 26.08 kg/m   BP Readings from Last 3 Encounters:  01/23/18 138/74  10/06/17 130/88  03/17/17 116/68    Wt Readings from Last 3 Encounters:  01/23/18 161 lb 9.6 oz (73.3 kg)  03/17/17 161  lb 3.2 oz (73.1 kg)  10/14/16 171 lb 9.6 oz (77.8 kg)    General appearance: alert, cooperative and appears stated age Ears: normal TM's and external ear canals both ears Throat: lips, mucosa, and tongue normal; teeth and gums normal Neck: no adenopathy, no carotid bruit, supple, symmetrical, trachea midline and thyroid not enlarged, symmetric, no tenderness/mass/nodules Back: symmetric, no curvature. ROM normal. No CVA tenderness. Lungs: clear to auscultation bilaterally Heart: regular rate and rhythm, S1, S2 normal, no murmur, click, rub or gallop Abdomen: soft, non-tender; bowel sounds normal;  no masses,  no organomegaly Pulses: 2+ and symmetric Skin: Skin color, texture, turgor normal. No rashes or lesions Lymph nodes: Cervical, supraclavicular, and axillary nodes normal.  Lab Results  Component Value Date   HGBA1C 6.1 01/23/2018   HGBA1C 5.8 (H) 03/24/2017   HGBA1C 6.1 10/14/2016    Lab Results  Component Value Date   CREATININE 0.90 01/23/2018   CREATININE 0.92 03/24/2017   CREATININE 0.86 10/14/2016    Lab Results  Component Value Date   WBC 5.4 01/23/2018   HGB 13.0 01/23/2018   HCT 39.1 01/23/2018   PLT 335.0 01/23/2018   GLUCOSE 90 01/23/2018   CHOL 200 01/23/2018   TRIG 123.0 01/23/2018   HDL 55.10 01/23/2018   LDLCALC 120 (H) 01/23/2018   ALT 17 01/23/2018   AST 17 01/23/2018   NA 137 01/23/2018   K 4.3 01/23/2018   CL 99 01/23/2018   CREATININE 0.90 01/23/2018   BUN 10 01/23/2018   CO2 27 01/23/2018   TSH 1.23 01/23/2018   HGBA1C 6.1 01/23/2018    Mr Cervical Spine Wo Contrast  Result Date: 05/19/2016 CLINICAL DATA:  Bilateral neck pain with radiculopathy EXAM: MRI CERVICAL SPINE WITHOUT CONTRAST TECHNIQUE: Multiplanar, multisequence MR imaging of the cervical spine was performed. No intravenous contrast was administered. COMPARISON:  03/02/2005 FINDINGS: Alignment: Mild reversal of lordosis. Borderline C3-4 anterolisthesis and C6-7 retrolisthesis. Vertebrae: No fracture, evidence of discitis, or bone lesion. Cord: Normal signal and morphology. Posterior Fossa, vertebral arteries, paraspinal tissues: Negative. Disc levels: C2-3: Minimal facet spurring on the left.  No impingement. C3-4: Mild facet spurring on the right. Borderline anterolisthesis. Mild disc narrowing and desiccation. No herniation or impingement C4-5: Disc narrowing and desiccation with uncovertebral ridging that is mild. Negative facets. No impingement C5-6: Greatest level of degenerative disc narrowing with endplate and uncovertebral ridging. Canal and foramina remain patent without  compressive stenosis. C6-7: Degenerative disc narrowing with endplate and uncovertebral ridging. Negative facets. Mild left foraminal narrowing C7-T1:Unremarkable. IMPRESSION: 1. Lower cervical disc degeneration with mild progression from 2006. 2. Widely patent spinal canal. 3. Uncovertebral ridging at C5-6 and C6-7 with up to mild left foraminal narrowing at C6-7. Electronically Signed   By: Monte Fantasia M.D.   On: 05/19/2016 09:58    Assessment & Plan:   Problem List Items Addressed This Visit    Overweight (BMI 25.0-29.9)    Discontinuing phentermine given length of use and new onset elevated blood pressure.  Encouraged to continue weight watchers and increase her exercise intensity as tolerated       HLD (hyperlipidemia)   H/O malignant neoplasm of thyroid   GERD (gastroesophageal reflux disease)    Increase PPI to twice daily..        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  Lab Results  Component Value Date   TSH 1.23 01/23/2018          Other Visit  Diagnoses    Colon cancer screening    -  Primary   Relevant Orders   Ambulatory referral to Gastroenterology   Impaired fasting glucose       B12 deficiency         A total of 25 minutes of face to face time was spent with patient more than half of which was spent in counselling about the above mentioned conditions  and coordination of care   I have discontinued Shaneen J. Hyndman's Phendimetrazine Tartrate and phentermine. I am also having her maintain her Calcium, MAGNESIUM PO, Vitamin D3, Vitamin B12, fluticasone, montelukast, ELIDEL, temazepam, SYNTHROID, and celecoxib.  No orders of the defined types were placed in this encounter.   Medications Discontinued During This Encounter  Medication Reason  . celecoxib (CELEBREX) 407 MG capsule Duplicate  . Phendimetrazine Tartrate 35 MG TABS Patient has not taken in last 30 days  . phentermine (ADIPEX-P) 37.5 MG tablet      Follow-up: No follow-ups on file.   Crecencio Mc, MD

## 2018-01-23 NOTE — Addendum Note (Signed)
Addended by: Arby Barrette on: 01/23/2018 04:37 PM   Modules accepted: Orders

## 2018-01-23 NOTE — Patient Instructions (Addendum)
We agreed to stopping the phentermine since your blood pressure is elevated   I recommend trying citrucel  30 minutes before or  after biggest meal    Continue the prilosec  40 mg twice daily     We can get an ultrasound of your neck  If your symptoms persist  in 2 weeks    Referral to [facility] for colonoscopy  (OR COLOGUARD  $700)  I;LL CHECK WITH JEFF ABOUT THE OOP COST

## 2018-01-24 LAB — CBC WITH DIFFERENTIAL/PLATELET
BASOS ABS: 0 10*3/uL (ref 0.0–0.1)
BASOS PCT: 0.9 % (ref 0.0–3.0)
EOS ABS: 0.2 10*3/uL (ref 0.0–0.7)
Eosinophils Relative: 2.8 % (ref 0.0–5.0)
HEMATOCRIT: 39.1 % (ref 36.0–46.0)
HEMOGLOBIN: 13 g/dL (ref 12.0–15.0)
LYMPHS PCT: 22.7 % (ref 12.0–46.0)
Lymphs Abs: 1.2 10*3/uL (ref 0.7–4.0)
MCHC: 33.3 g/dL (ref 30.0–36.0)
MCV: 88.8 fl (ref 78.0–100.0)
Monocytes Absolute: 0.4 10*3/uL (ref 0.1–1.0)
Monocytes Relative: 8.3 % (ref 3.0–12.0)
Neutro Abs: 3.5 10*3/uL (ref 1.4–7.7)
Neutrophils Relative %: 65.3 % (ref 43.0–77.0)
Platelets: 335 10*3/uL (ref 150.0–400.0)
RBC: 4.41 Mil/uL (ref 3.87–5.11)
RDW: 12.9 % (ref 11.5–15.5)
WBC: 5.4 10*3/uL (ref 4.0–10.5)

## 2018-01-24 LAB — COMPREHENSIVE METABOLIC PANEL
ALBUMIN: 4.4 g/dL (ref 3.5–5.2)
ALT: 17 U/L (ref 0–35)
AST: 17 U/L (ref 0–37)
Alkaline Phosphatase: 66 U/L (ref 39–117)
BILIRUBIN TOTAL: 0.3 mg/dL (ref 0.2–1.2)
BUN: 10 mg/dL (ref 6–23)
CALCIUM: 8.8 mg/dL (ref 8.4–10.5)
CO2: 27 mEq/L (ref 19–32)
CREATININE: 0.9 mg/dL (ref 0.40–1.20)
Chloride: 99 mEq/L (ref 96–112)
GFR: 67.69 mL/min (ref >60.00)
Glucose, Bld: 90 mg/dL (ref 70–99)
Potassium: 4.3 mEq/L (ref 3.5–5.1)
Sodium: 137 mEq/L (ref 135–145)
Total Protein: 7.2 g/dL (ref 6.0–8.3)

## 2018-01-24 LAB — LIPID PANEL
Cholesterol: 200 mg/dL (ref 0–200)
HDL: 55.1 mg/dL (ref >39.00)
LDL Cholesterol: 120 mg/dL — ABNORMAL HIGH (ref 0–99)
NONHDL: 144.47
Total CHOL/HDL Ratio: 4
Triglycerides: 123 mg/dL (ref 0.0–149.0)
VLDL: 24.6 mg/dL (ref 0.0–40.0)

## 2018-01-24 LAB — TSH: TSH: 1.23 u[IU]/mL (ref 0.35–4.50)

## 2018-01-24 LAB — VITAMIN B12: VITAMIN B 12: 582 pg/mL (ref 211–911)

## 2018-01-24 LAB — HEMOGLOBIN A1C: HEMOGLOBIN A1C: 6.1 % (ref 4.6–6.5)

## 2018-01-25 DIAGNOSIS — K219 Gastro-esophageal reflux disease without esophagitis: Secondary | ICD-10-CM | POA: Insufficient documentation

## 2018-01-25 NOTE — Assessment & Plan Note (Addendum)
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  Lab Results  Component Value Date   TSH 1.23 01/23/2018

## 2018-01-25 NOTE — Assessment & Plan Note (Signed)
Discontinuing phentermine given length of use and new onset elevated blood pressure.  Encouraged to continue weight watchers and increase her exercise intensity as tolerated

## 2018-01-25 NOTE — Assessment & Plan Note (Addendum)
Increase PPI to twice daily 

## 2018-01-29 ENCOUNTER — Other Ambulatory Visit: Payer: Self-pay | Admitting: Internal Medicine

## 2018-02-06 ENCOUNTER — Other Ambulatory Visit: Payer: Self-pay | Admitting: Internal Medicine

## 2018-03-06 ENCOUNTER — Other Ambulatory Visit: Payer: Self-pay | Admitting: Student

## 2018-03-06 DIAGNOSIS — K219 Gastro-esophageal reflux disease without esophagitis: Secondary | ICD-10-CM

## 2018-03-07 ENCOUNTER — Ambulatory Visit
Admission: RE | Admit: 2018-03-07 | Discharge: 2018-03-07 | Disposition: A | Discharge status: Home / Self Care | Payer: No Typology Code available for payment source | Source: Ambulatory Visit | Attending: Student | Admitting: Student

## 2018-03-07 DIAGNOSIS — K219 Gastro-esophageal reflux disease without esophagitis: Secondary | ICD-10-CM | POA: Diagnosis not present

## 2018-03-07 DIAGNOSIS — K449 Diaphragmatic hernia without obstruction or gangrene: Secondary | ICD-10-CM | POA: Diagnosis not present

## 2018-03-13 ENCOUNTER — Ambulatory Visit: Payer: Self-pay | Admitting: General Surgery

## 2018-04-13 ENCOUNTER — Other Ambulatory Visit: Payer: Self-pay | Admitting: Internal Medicine

## 2018-04-16 NOTE — Telephone Encounter (Signed)
Refilled: 11/06/2017 Last OV: 01/23/2018 Next OV: not scheduled

## 2018-04-17 ENCOUNTER — Telehealth: Payer: Self-pay | Admitting: Internal Medicine

## 2018-04-17 DIAGNOSIS — R300 Dysuria: Secondary | ICD-10-CM

## 2018-04-17 NOTE — Telephone Encounter (Signed)
Copied from [place] [phone]. Topic: Quick Communication - Rx Refill/Question >> Apr 17, 2018 12:03 PM Judyann Munson wrote: Patient is calling to advise she has another UTI and stated the Macrobid does not work for her. She is requesting something to be called in to the pharmacy listed below. Please advise  Preferred Pharmacy (with phone number or street name):[facility] [place], [place] - [place] [phone] (Phone) [phone] (Fax)

## 2018-04-17 NOTE — Telephone Encounter (Signed)
rx request 

## 2018-04-18 NOTE — Telephone Encounter (Signed)
Pt called in to follow up on request.   Please advise.

## 2018-04-18 NOTE — Telephone Encounter (Signed)
LMTCB. Please transfer pt to our office.  

## 2018-04-19 ENCOUNTER — Other Ambulatory Visit (INDEPENDENT_AMBULATORY_CARE_PROVIDER_SITE_OTHER): Payer: No Typology Code available for payment source

## 2018-04-19 DIAGNOSIS — R300 Dysuria: Secondary | ICD-10-CM | POA: Diagnosis not present

## 2018-04-19 NOTE — Telephone Encounter (Signed)
Spoke with pt and she stated that she is coming in this afternoon to drop off a urine. Orders placed pt scheduled on lab schedule.

## 2018-04-20 LAB — URINALYSIS, ROUTINE W REFLEX MICROSCOPIC
BILIRUBIN URINE: NEGATIVE
Hgb urine dipstick: NEGATIVE
Ketones, ur: NEGATIVE
Nitrite: POSITIVE — AB
PH: 7 (ref 5.0–8.0)
RBC / HPF: NONE SEEN (ref 0–?)
SPECIFIC GRAVITY, URINE: 1.01 (ref 1.000–1.030)
Total Protein, Urine: NEGATIVE
Urine Glucose: NEGATIVE
Urobilinogen, UA: 0.2 (ref 0.0–1.0)

## 2018-04-20 LAB — URINE CULTURE
MICRO NUMBER: 91220550
SPECIMEN QUALITY: ADEQUATE

## 2018-04-26 ENCOUNTER — Other Ambulatory Visit: Payer: Self-pay | Admitting: Internal Medicine

## 2018-09-05 ENCOUNTER — Other Ambulatory Visit: Payer: Self-pay | Admitting: Internal Medicine

## 2018-09-13 DIAGNOSIS — E039 Hypothyroidism, unspecified: Secondary | ICD-10-CM

## 2018-09-13 DIAGNOSIS — R7301 Impaired fasting glucose: Secondary | ICD-10-CM

## 2018-09-13 DIAGNOSIS — R5383 Other fatigue: Secondary | ICD-10-CM

## 2018-09-14 ENCOUNTER — Other Ambulatory Visit (INDEPENDENT_AMBULATORY_CARE_PROVIDER_SITE_OTHER): Payer: No Typology Code available for payment source

## 2018-09-14 DIAGNOSIS — E039 Hypothyroidism, unspecified: Secondary | ICD-10-CM

## 2018-09-14 DIAGNOSIS — R5383 Other fatigue: Secondary | ICD-10-CM | POA: Diagnosis not present

## 2018-09-14 DIAGNOSIS — R7301 Impaired fasting glucose: Secondary | ICD-10-CM | POA: Diagnosis not present

## 2018-09-14 LAB — BASIC METABOLIC PANEL
BUN: 16 mg/dL (ref 6–23)
CALCIUM: 8.7 mg/dL (ref 8.4–10.5)
CO2: 29 mEq/L (ref 19–32)
CREATININE: 0.84 mg/dL (ref 0.40–1.20)
Chloride: 101 mEq/L (ref 96–112)
GFR: 68.82 mL/min (ref >60.00)
Glucose, Bld: 91 mg/dL (ref 70–99)
Potassium: 3.9 mEq/L (ref 3.5–5.1)
Sodium: 137 mEq/L (ref 135–145)

## 2018-09-14 LAB — CBC WITH DIFFERENTIAL/PLATELET
Basophils Absolute: 0.1 10*3/uL (ref 0.0–0.1)
Basophils Relative: 1.6 % (ref 0.0–3.0)
EOS PCT: 4.7 % (ref 0.0–5.0)
Eosinophils Absolute: 0.2 10*3/uL (ref 0.0–0.7)
HCT: 39.3 % (ref 36.0–46.0)
HEMOGLOBIN: 13.1 g/dL (ref 12.0–15.0)
Lymphocytes Relative: 29.5 % (ref 12.0–46.0)
Lymphs Abs: 1.2 10*3/uL (ref 0.7–4.0)
MCHC: 33.4 g/dL (ref 30.0–36.0)
MCV: 88.6 fl (ref 78.0–100.0)
Monocytes Absolute: 0.4 10*3/uL (ref 0.1–1.0)
Monocytes Relative: 9.8 % (ref 3.0–12.0)
Neutro Abs: 2.2 10*3/uL (ref 1.4–7.7)
Neutrophils Relative %: 54.4 % (ref 43.0–77.0)
Platelets: 331 10*3/uL (ref 150.0–400.0)
RBC: 4.44 Mil/uL (ref 3.87–5.11)
RDW: 13.8 % (ref 11.5–15.5)
WBC: 4 10*3/uL (ref 4.0–10.5)

## 2018-09-14 LAB — HEMOGLOBIN A1C: HEMOGLOBIN A1C: 5.9 % (ref 4.6–6.5)

## 2018-09-14 LAB — VITAMIN B12: VITAMIN B 12: 399 pg/mL (ref 211–911)

## 2018-09-14 LAB — TSH: TSH: 2.93 u[IU]/mL (ref 0.35–4.50)

## 2018-09-14 NOTE — Telephone Encounter (Signed)
Noted. Pt is coming in for lab work this morning. When results come back if appt is needed will call pt to schedule.

## 2018-09-28 ENCOUNTER — Other Ambulatory Visit: Payer: Self-pay | Admitting: Internal Medicine

## 2018-10-10 ENCOUNTER — Other Ambulatory Visit: Payer: Self-pay | Admitting: Internal Medicine

## 2018-10-10 NOTE — Telephone Encounter (Signed)
Refilled: 04/16/2018 Last OV: 01/23/2018 Next OV: not scheduled

## 2018-10-25 ENCOUNTER — Ambulatory Visit (INDEPENDENT_AMBULATORY_CARE_PROVIDER_SITE_OTHER): Payer: No Typology Code available for payment source | Admitting: Family Medicine

## 2018-10-25 ENCOUNTER — Other Ambulatory Visit: Payer: Self-pay

## 2018-10-25 DIAGNOSIS — F4329 Adjustment disorder with other symptoms: Secondary | ICD-10-CM

## 2018-10-25 DIAGNOSIS — L509 Urticaria, unspecified: Secondary | ICD-10-CM

## 2018-10-25 MED ORDER — VENLAFAXINE HCL ER 37.5 MG PO CP24: 37.5000 mg | ORAL_CAPSULE | Freq: Every day | ORAL | 2 refills | Status: DC

## 2018-10-25 NOTE — Progress Notes (Signed)
Patient ID: Emily Arellano, female   DOB: [birth year], 63 y.o.   MRN: [id number]   Virtual Visit via video Note  This visit type was conducted due to national recommendations for restrictions regarding the COVID-19 pandemic (e.g. social distancing).  This format is felt to be most appropriate for this patient at this time.  All issues noted in this document were discussed and addressed.  No physical exam was performed (except for noted visual exam findings with Video Visits).   I connected with Emily Arellano on 10/26/18 at  3:00 PM EDT by a video enabled telemedicine application &  verified that I am speaking with the correct person using two identifiers. Location patient: home Location provider: [facility] Persons participating in the virtual visit: patient, provider  I discussed the limitations, risks, security and privacy concerns of performing an evaluation and management service by video and the availability of in person appointments. I also discussed with the patient that there may be a patient responsible charge related to this service. The patient expressed understanding and agreed to proceed.   HPI:  Patient and I connected via video chat today due to patient having stress induced hive outbreak.  She is currently working with her dermatologist and is already on a steroid taper, antihistamine blockade with Allegra and Pepcid and also has doxepin to take at night to help reduce her itching/anxiety at night.  Patient's dermatologist suggested she contact with PCP due to patient's increased stress and anxiety which seems to be exacerbating her hives.  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   ROS:  See pertinent positives and negatives per HPI.  Past Medical History:  Diagnosis Date  .  Anxiety    "xanax prn"  . Heart murmur   . Hypothyroidism   . Osteoarthritis   . Thyroid cancer [facility])     Past Surgical History:  Procedure Laterality Date  . ABDOMINAL HYSTERECTOMY    . APPENDECTOMY  ~ 2005   "I think; when they got my gallbladder"  . BREAST SURGERY  2016   reduction   . CHOLECYSTECTOMY  ~ 2005  . HARVEST BONE GRAFT  02/23/2012   Procedure: HARVEST ILIAC BONE GRAFT;  Surgeon: Roseanne Kaufman, MD;  Location: [place];  Service: Orthopedics;  Laterality: N/A;  With Iliac Crest and BMP Bone Graft Fixation As Necessary Radial Nerve Release (Neurolysis as Necessary)   . 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 TENDON REPAIR  10/2011   "posterior; left foot"  . TONSILLECTOMY    . TOTAL THYROIDECTOMY  1993    and        radiation    Family History  Problem Relation Age of Onset  . Dementia Mother   . Hypertension Mother   . Diabetes Mother   . Cancer Father        lung  . Cancer Sister        bladder    Social History   Tobacco Use  . Smoking status: Never Smoker  . Smokeless tobacco: Never Used  Substance Use Topics  . Alcohol use: No  Current Outpatient Medications:  .  Calcium 150 MG TABS, Take 2 tablets by mouth at bedtime., Disp: , Rfl:  .  celecoxib (CELEBREX) 200 MG capsule, TAKE 2 CAPSULES BY MOUTH EVERY DAY AFTERBREAKFAST, Disp: 60 capsule, Rfl: 0 .  Cholecalciferol (VITAMIN D3) 2000 UNITS capsule, Take by mouth., Disp: , Rfl:  .  Cyanocobalamin (VITAMIN B12) 3000 MCG/ML LIQD, Place under the tongue., Disp: , Rfl:  .  ELIDEL 1 % cream, APPLY TO THE AFFECTED AREA ON THE SKIN EVERY DAY AS DIRECTED, Disp: , Rfl: 3 .  fluticasone (FLONASE) 50 MCG/ACT nasal spray, Place into the nose., Disp: , Rfl:  .  MAGNESIUM PO, Take 3-4 tablets by  mouth at bedtime., Disp: , Rfl:  .  montelukast (SINGULAIR) 10 MG tablet, TAKE ONE TABLET BY MOUTH AT BEDTIME, Disp: 90 tablet, Rfl: 1 .  omeprazole (PRILOSEC) 40 MG capsule, TAKE 1 CAPSULE BY MOUTH DAILY, Disp: 90 capsule, Rfl: 1 .  SYNTHROID 75 MCG tablet, TAKE 1 TABLET EVERY DAY ON EMPTY STOMACHWITH A GLASS OF WATER AT LEAST 30-60 MINBEFORE BREAKFAST, Disp: 30 tablet, Rfl: 3 .  temazepam (RESTORIL) 30 MG capsule, TAKE 1 CAPSULE BY MOUTH AT BEDTIME AS NEEDED FOR SLEEP, Disp: 30 capsule, Rfl: 0 .  venlafaxine XR (EFFEXOR XR) 37.5 MG 24 hr capsule, Take 1 capsule (37.5 mg total) by mouth daily with breakfast., Disp: 30 capsule, Rfl: 2  EXAM:  GENERAL: alert, oriented, appears well and in no acute distress  HEENT: atraumatic, conjunttiva clear, no obvious abnormalities on inspection of external nose.  NECK: normal movements of the head and neck  LUNGS: on inspection no signs of respiratory distress, breathing rate appears normal, no obvious gross SOB, gasping or wheezing  CV: no obvious cyanosis  SKIN: Via video I can see red raised hives scattered across arms, chest and some on cheeks of face.  MS: moves all visible extremities without noticeable abnormality  PSYCH/NEURO: pleasant and cooperative, no obvious depression or anxiety, speech and thought processing grossly intact  ASSESSMENT AND PLAN:  Discussed the following assessment and plan:  Hives  Stress and adjustment reaction - Plan: venlafaxine XR (EFFEXOR XR) 37.5 MG 24 hr capsule   Patient will continue taking the steroid taper, histamine blockade and use doxepin as prescribed by dermatologist.  We will trial addition of Effexor once daily in hopes of reducing the patient's overall stress and anxiety which then in turn should help reduce hive reaction.  We also discussed different strategies for anxiety reduction such as deep breathing and trying to focus on things that we can control rather than worrying about the what  if's.    I discussed the assessment and treatment plan with the patient. The patient was provided an opportunity to ask questions and all were answered. The patient agreed with the plan and demonstrated an understanding of the instructions.   The patient was advised to call back or seek an in-person evaluation if the symptoms worsen or if the condition fails to improve as anticipated.  Otherwise we will plan to have patient follow up in 4-6 weeks for recheck on how she is doing on effexor.   Jodelle Green, FNP

## 2018-10-26 ENCOUNTER — Encounter: Payer: Self-pay | Admitting: Family Medicine

## 2018-11-30 ENCOUNTER — Encounter: Payer: Self-pay | Admitting: Internal Medicine

## 2018-11-30 ENCOUNTER — Ambulatory Visit (INDEPENDENT_AMBULATORY_CARE_PROVIDER_SITE_OTHER): Payer: No Typology Code available for payment source | Admitting: Internal Medicine

## 2018-11-30 ENCOUNTER — Other Ambulatory Visit: Payer: Self-pay | Admitting: Internal Medicine

## 2018-11-30 VITALS — Ht 67.0 in | Wt 183.0 lb

## 2018-11-30 DIAGNOSIS — E663 Overweight: Secondary | ICD-10-CM

## 2018-11-30 DIAGNOSIS — N8110 Cystocele, unspecified: Secondary | ICD-10-CM | POA: Diagnosis not present

## 2018-11-30 DIAGNOSIS — R350 Frequency of micturition: Secondary | ICD-10-CM

## 2018-11-30 DIAGNOSIS — R81 Glycosuria: Secondary | ICD-10-CM

## 2018-11-30 DIAGNOSIS — N390 Urinary tract infection, site not specified: Secondary | ICD-10-CM

## 2018-11-30 DIAGNOSIS — R7303 Prediabetes: Secondary | ICD-10-CM

## 2018-11-30 LAB — POCT URINALYSIS DIPSTICK
Bilirubin, UA: NEGATIVE
Glucose, UA: POSITIVE — AB
Nitrite, UA: POSITIVE
Protein, UA: POSITIVE — AB
Spec Grav, UA: 1.01 (ref 1.010–1.025)
Urobilinogen, UA: 1 E.U./dL
pH, UA: 6 (ref 5.0–8.0)

## 2018-11-30 MED ORDER — DOXEPIN HCL 75 MG PO CAPS: 75.0000 mg | ORAL_CAPSULE | Freq: Every day | ORAL | 5 refills | Status: DC

## 2018-11-30 MED ORDER — NITROFURANTOIN MONOHYD MACRO 100 MG PO CAPS: 100.0000 mg | ORAL_CAPSULE | Freq: Two times a day (BID) | ORAL | 0 refills | Status: DC

## 2018-11-30 MED ORDER — LIRAGLUTIDE -WEIGHT MANAGEMENT 18 MG/3ML ~~LOC~~ SOPN: 0.6000 mg | PEN_INJECTOR | Freq: Every day | SUBCUTANEOUS | 0 refills | Status: DC

## 2018-11-30 NOTE — Patient Instructions (Signed)
The saxenda has been sent to [facility]   [facility]  If the urine today is suggestive of infection,  I'll call in macrobid .  If the culture proves to be negative,  You can stop the antibiotic (but the culture will take at least 48 hours to result)

## 2018-11-30 NOTE — Progress Notes (Signed)
Pt stated that is concerned about a possible prolapsed bladder. The pt stated that she has been having a lot of bladder spasms in the last several weeks.   The pt also stated that about 2 months ago she broke out in hives all over her body due to stress. She saw Dr. Gigi Gin and he treated her with a prednisone injection and then oral prednisone for a month. In that time that pt has gained 20lbs and is having a hard time getting it back off. The pt stated that she has used saxenda in the past and it seemed to be the only thing that helped her due to her not having a thyroid.

## 2018-11-30 NOTE — Progress Notes (Signed)
Virtual Visit via [email or web address]  This visit type was conducted due to national recommendations for restrictions regarding the COVID-19 pandemic (e.g. social distancing).  This format is felt to be most appropriate for this patient at this time.  All issues noted in this document were discussed and addressed.  No physical exam was performed (except for noted visual exam findings with Video Visits).   I connected with@ on 11/30/18 at  2:00 PM EDT by a video enabled telemedicine application or telephone and verified that I am speaking with the correct person using two identifiers. Location patient: home Location provider: work or home office Persons participating in the virtual visit: patient, provider  I discussed the limitations, risks, security and privacy concerns of performing an evaluation and management service by telephone and the availability of in person appointments. I also discussed with the patient that there may be a patient responsible charge related to this service. The patient expressed understanding and agreed to proceed. 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 40 days ago.  Required several weeks of prednisone and gained  20 lbs.  Feels miserable.  Wants to resume Saxenda   2) Bladder feels like it has dropped ,  Having frequency  and intermittent left sided back pain for the past week .  Taking azo 3 times daily for pressure,  Throbbing,  Improves when bladder is full.    ROS: See pertinent positives and negatives per HPI.  Past Medical History:  Diagnosis Date  . Anxiety    "xanax prn"  . Heart murmur   . Hypothyroidism   . Osteoarthritis   . Thyroid cancer [facility])     Past Surgical History:  Procedure Laterality Date  . ABDOMINAL HYSTERECTOMY    . APPENDECTOMY  ~ 2005   "I think;  when they got my gallbladder"  . BREAST SURGERY  2016   reduction   . CHOLECYSTECTOMY  ~ 2005  . HARVEST BONE GRAFT  02/23/2012   Procedure: HARVEST ILIAC BONE GRAFT;  Surgeon: Roseanne Kaufman, MD;  Location: [place];  Service: Orthopedics;  Laterality: N/A;  With Iliac Crest and BMP Bone Graft Fixation As Necessary Radial Nerve Release (Neurolysis as Necessary)   . 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 TENDON REPAIR  10/2011   "posterior; left foot"  . TONSILLECTOMY    . TOTAL THYROIDECTOMY  1993    and        radiation    Family History  Problem Relation Age of Onset  . Dementia Mother   . Hypertension Mother   . Diabetes Mother   . Cancer Father        lung  . Cancer Sister        bladder    SOCIAL HX:  reports that she has never smoked. She has never used smokeless tobacco. She reports that she does not drink alcohol or use drugs.   Current Outpatient Medications:  .  Cholecalciferol (VITAMIN D3) 2000 UNITS capsule, Take by mouth., Disp: , Rfl:  .  Cyanocobalamin (VITAMIN B12) 3000 MCG/ML LIQD, Place under the  tongue., Disp: , Rfl:  .  ELIDEL 1 % cream, APPLY TO THE AFFECTED AREA ON THE SKIN EVERY DAY AS DIRECTED, Disp: , Rfl: 3 .  fluticasone (FLONASE) 50 MCG/ACT nasal spray, Place into the nose., Disp: , Rfl:  .  MAGNESIUM PO, Take 3-4 tablets by mouth at bedtime., Disp: , Rfl:  .  montelukast (SINGULAIR) 10 MG tablet, TAKE ONE TABLET BY MOUTH AT BEDTIME, Disp: 90 tablet, Rfl: 1 .  SYNTHROID 75 MCG tablet, TAKE 1 TABLET EVERY DAY ON EMPTY STOMACHWITH A GLASS OF WATER AT LEAST 30-60 MINBEFORE BREAKFAST, Disp: 30 tablet, Rfl: 3 .  ciprofloxacin (CIPRO) 250 MG tablet, Take 1 tablet (250 mg total) by mouth 2 (two) times daily., Disp: 6 tablet, Rfl: 0 .  doxepin  (SINEQUAN) 75 MG capsule, Take 1 capsule (75 mg total) by mouth at bedtime., Disp: 30 capsule, Rfl: 5 .  Liraglutide -Weight Management (SAXENDA) 18 MG/3ML SOPN, Inject 0.6 mg into the skin daily. Increase dose weekly as follows: Week 2: 1.2 mg daily ; Week 3: 1.8 mg daily; Week 4: 2.4 mg daily, Disp: 9 mL, Rfl: 0 .  nitrofurantoin, macrocrystal-monohydrate, (MACROBID) 100 MG capsule, Take 1 capsule (100 mg total) by mouth 2 (two) times daily., Disp: 10 capsule, Rfl: 0  EXAM:  VITALS per patient if applicable:  GENERAL: alert, oriented, appears well and in no acute distress  HEENT: atraumatic, conjunttiva clear, no obvious abnormalities on inspection of external nose and ears  NECK: normal movements of the head and neck  LUNGS: on inspection no signs of respiratory distress, breathing rate appears normal, no obvious gross SOB, gasping or wheezing  CV: no obvious cyanosis  MS: moves all visible extremities without noticeable abnormality  PSYCH/NEURO: pleasant and cooperative, no obvious depression or anxiety, speech and thought processing grossly intact  ASSESSMENT AND PLAN:  Discussed the following assessment and plan:  Frequency of micturition - Plan: POCT urinalysis dipstick, Urine Culture, Urine Microscopic Only, Urine Microscopic Only, Urine Culture, POCT urinalysis dipstick, CANCELED: Urine Microscopic Only, CANCELED: Urine Culture  Acute lower UTI  Overweight (BMI 25.0-29.9)  Bladder prolapse, female, acquired  Acute lower UTI Secondary to E Coli , patient was treated empirically with Macrobid  For positive UA but symptoms have not completely resolved after 3 days of treatment.  Given the sensitivity profile,  Will change to cipro 250 mg bid x 3 days   Overweight (BMI 25.0-29.9) She has gained 20 lbs due to use of prednisone for one month to treat systemic hives (prescribed by dermatology).  She is requesting repeat treatment with saxenda.   Bladder prolapse, female,  acquired secondary to TAH/BSO.  Referral to Urology deferred until she has insurance      I discussed the assessment and treatment plan with the patient. The patient was provided an opportunity to ask questions and all were answered. The patient agreed with the plan and demonstrated an understanding of the instructions.   The patient was advised to call back or seek an in-person evaluation if the symptoms worsen or if the condition fails to improve as anticipated.  I provided 25 minutes of non-face-to-face time during this encounter.   Crecencio Mc, MD

## 2018-12-03 DIAGNOSIS — N390 Urinary tract infection, site not specified: Secondary | ICD-10-CM | POA: Insufficient documentation

## 2018-12-03 DIAGNOSIS — N8110 Cystocele, unspecified: Secondary | ICD-10-CM | POA: Insufficient documentation

## 2018-12-03 LAB — URINALYSIS, MICROSCOPIC ONLY

## 2018-12-03 LAB — URINE CULTURE
MICRO NUMBER:: 500438
SPECIMEN QUALITY:: ADEQUATE

## 2018-12-03 MED ORDER — CIPROFLOXACIN HCL 250 MG PO TABS: 250.0000 mg | ORAL_TABLET | Freq: Two times a day (BID) | ORAL | 0 refills | Status: DC

## 2018-12-03 NOTE — Assessment & Plan Note (Signed)
secondary to TAH/BSO.  Referral to Urology deferred until she has insurance

## 2018-12-03 NOTE — Assessment & Plan Note (Signed)
She has gained 20 lbs due to use of prednisone for one month to treat systemic hives (prescribed by dermatology).  She is requesting repeat treatment with saxenda.

## 2018-12-03 NOTE — Assessment & Plan Note (Addendum)
Secondary to E Coli , patient was treated empirically with Macrobid  For positive UA but symptoms have not completely resolved after 3 days of treatment.  Given the sensitivity profile,  Will change to cipro 250 mg bid x 3 days

## 2018-12-26 ENCOUNTER — Other Ambulatory Visit: Payer: Self-pay | Admitting: Internal Medicine

## 2018-12-26 NOTE — Telephone Encounter (Signed)
Not is current medication list  Refilled: 10/10/2018 Last OV: 11/30/2018 Next OV: not scheduled

## 2018-12-26 NOTE — Telephone Encounter (Signed)
Asking patient.  Do not fill

## 2018-12-28 ENCOUNTER — Other Ambulatory Visit: Payer: Self-pay | Admitting: Internal Medicine

## 2018-12-28 MED ORDER — TEMAZEPAM 30 MG PO CAPS: ORAL_CAPSULE | ORAL | 5 refills | Status: DC

## 2019-01-26 ENCOUNTER — Other Ambulatory Visit: Payer: Self-pay | Admitting: Internal Medicine

## 2019-02-22 ENCOUNTER — Other Ambulatory Visit: Payer: Self-pay | Admitting: Internal Medicine

## 2019-03-08 DIAGNOSIS — E663 Overweight: Secondary | ICD-10-CM

## 2019-03-08 DIAGNOSIS — E890 Postprocedural hypothyroidism: Secondary | ICD-10-CM

## 2019-03-10 MED ORDER — PHENTERMINE-TOPIRAMATE ER 3.75-23 MG PO CP24: ORAL_CAPSULE | ORAL | 0 refills | Status: DC

## 2019-03-10 NOTE — Assessment & Plan Note (Signed)
tsh was 8.5 at [facility] clinic I July and weekly dose was increased by 1/2 tablet ONCE WEEKLY.  Repeat tsh is due and will be done here rather than return to [facility] per patient preference

## 2019-03-10 NOTE — Assessment & Plan Note (Signed)
She had a 6 lb st loss with Dr Letta Pate KETO diet  And plateaued,  Sh e is requesting a trial of Qsymia to help with nocturnal overeating

## 2019-03-12 ENCOUNTER — Telehealth: Payer: Self-pay | Admitting: Internal Medicine

## 2019-03-12 MED ORDER — PHENTERMINE HCL 8 MG PO TABS: 1.0000 | ORAL_TABLET | Freq: Two times a day (BID) | ORAL | 2 refills | Status: DC

## 2019-03-12 MED ORDER — TOPIRAMATE 25 MG PO TABS: 25.0000 mg | ORAL_TABLET | Freq: Two times a day (BID) | ORAL | 2 refills | Status: DC

## 2019-03-12 NOTE — Telephone Encounter (Signed)
qsymia d/c;'d and phentermine/topomax rx's sent to [facility]

## 2019-03-12 NOTE — Telephone Encounter (Signed)
Pharmacy called in because pt was prescribed Qsymia - weight loss medication. Pharmacy says that medication cost 200.00 they would like to know  if provider is willing  to Rx phentermine and topiramate instead because they would be a lot cheaper for pt?    Please advise

## 2019-04-27 ENCOUNTER — Other Ambulatory Visit: Payer: Self-pay | Admitting: Internal Medicine

## 2019-06-25 ENCOUNTER — Other Ambulatory Visit: Payer: Self-pay | Admitting: Internal Medicine

## 2019-06-26 NOTE — Telephone Encounter (Signed)
Refilled: 12/28/2018 Last OV: 11/30/2018 Next OV: not scheduled

## 2019-08-19 ENCOUNTER — Other Ambulatory Visit: Payer: Self-pay | Admitting: Internal Medicine

## 2019-08-21 ENCOUNTER — Other Ambulatory Visit: Payer: Self-pay | Admitting: Internal Medicine

## 2019-08-24 ENCOUNTER — Other Ambulatory Visit: Payer: Self-pay | Admitting: Internal Medicine

## 2019-09-17 ENCOUNTER — Telehealth: Payer: Self-pay | Admitting: Internal Medicine

## 2019-09-17 NOTE — Telephone Encounter (Signed)
Do not see this medication in pt's med list.   Last OV: 11/30/2018 Next OV: not scheduled

## 2019-09-17 NOTE — Telephone Encounter (Signed)
LMTCB. Need to scheduled pt for a follow up appt with Dr. Tullo. 

## 2019-09-17 NOTE — Telephone Encounter (Signed)
Pt called and scheduled appt for 10/25/19

## 2019-09-17 NOTE — Telephone Encounter (Signed)
It's low dose phentermine   Please notify patient that the prescriptions was  Refilled for 30 days only because it is  controlled substances and require a  6 month follow up and  it has been 10 months since her last visit. Marland Kitchen  OFFICE VISIT NEEDED prior to any more refills

## 2019-09-18 ENCOUNTER — Other Ambulatory Visit: Payer: Self-pay | Admitting: Internal Medicine

## 2019-10-10 ENCOUNTER — Ambulatory Visit: Payer: No Typology Code available for payment source | Admitting: Dermatology

## 2019-10-17 ENCOUNTER — Ambulatory Visit: Payer: No Typology Code available for payment source | Admitting: Dermatology

## 2019-10-24 ENCOUNTER — Other Ambulatory Visit: Payer: Self-pay

## 2019-10-25 ENCOUNTER — Encounter: Payer: Self-pay | Admitting: Internal Medicine

## 2019-10-25 ENCOUNTER — Ambulatory Visit: Payer: No Typology Code available for payment source | Admitting: Internal Medicine

## 2019-10-25 DIAGNOSIS — F411 Generalized anxiety disorder: Secondary | ICD-10-CM

## 2019-10-25 DIAGNOSIS — R7303 Prediabetes: Secondary | ICD-10-CM

## 2019-10-25 DIAGNOSIS — E7800 Pure hypercholesterolemia, unspecified: Secondary | ICD-10-CM | POA: Diagnosis not present

## 2019-10-25 DIAGNOSIS — E890 Postprocedural hypothyroidism: Secondary | ICD-10-CM

## 2019-10-25 LAB — COMPREHENSIVE METABOLIC PANEL
ALT: 19 U/L (ref 0–35)
AST: 17 U/L (ref 0–37)
Albumin: 4.4 g/dL (ref 3.5–5.2)
Alkaline Phosphatase: 70 U/L (ref 39–117)
BUN: 15 mg/dL (ref 6–23)
CO2: 28 mEq/L (ref 19–32)
Calcium: 8.6 mg/dL (ref 8.4–10.5)
Chloride: 103 mEq/L (ref 96–112)
Creatinine, Ser: 0.9 mg/dL (ref 0.40–1.20)
GFR: 63.33 mL/min (ref >60.00)
Glucose, Bld: 118 mg/dL — ABNORMAL HIGH (ref 70–99)
Potassium: 4.6 mEq/L (ref 3.5–5.1)
Sodium: 140 mEq/L (ref 135–145)
Total Bilirubin: 0.3 mg/dL (ref 0.2–1.2)
Total Protein: 6.6 g/dL (ref 6.0–8.3)

## 2019-10-25 LAB — TSH: TSH: 3.97 u[IU]/mL (ref 0.35–4.50)

## 2019-10-25 LAB — LIPID PANEL
Cholesterol: 212 mg/dL — ABNORMAL HIGH (ref 0–200)
HDL: 64.3 mg/dL
LDL Cholesterol: 124 mg/dL — ABNORMAL HIGH (ref 0–99)
NonHDL: 147.68
Total CHOL/HDL Ratio: 3
Triglycerides: 117 mg/dL (ref 0.0–149.0)
VLDL: 23.4 mg/dL (ref 0.0–40.0)

## 2019-10-25 MED ORDER — TEMAZEPAM 30 MG PO CAPS: ORAL_CAPSULE | ORAL | 5 refills | Status: DC

## 2019-10-25 MED ORDER — ALPRAZOLAM 0.25 MG PO TABS: 0.2500 mg | ORAL_TABLET | Freq: Two times a day (BID) | ORAL | 0 refills | Status: DC | PRN

## 2019-10-25 MED ORDER — ESCITALOPRAM OXALATE 10 MG PO TABS: 10.0000 mg | ORAL_TABLET | Freq: Every day | ORAL | 2 refills | Status: DC

## 2019-10-25 NOTE — Assessment & Plan Note (Signed)
Checking thyroid level today   Lab Results  Component Value Date   TSH 2.93 09/14/2018

## 2019-10-25 NOTE — Addendum Note (Signed)
Addended by: Tor Netters I on: 10/25/2019 08:51 AM   Modules accepted: Orders

## 2019-10-25 NOTE — Patient Instructions (Signed)
For your anxiety:  Please start the Lexapro (escitalopram) at 1/2 tablet daily in the evening for the first few days to avoid nausea.  You can increase to a full tablet after 4 days if you havenot developed side effects of nausea.  If the lexapro interferes with your sleep, take it in the morning instead  r e mail me to let me know how it is helping   Use the alprazolam as needed for panic attacks.   I also recommend melatonin 10 mg at dinner time to help your sleep cycle

## 2019-10-25 NOTE — Assessment & Plan Note (Signed)
Aggravated by granddaughter's life threatening illness.  Starting lexapro and adding rn xanax for panic attacks  .   I provided  30 minutes of  face-to-face time during this encounter counselling patient  On the  above mentioned problems , and coordination  of care .

## 2019-10-25 NOTE — Assessment & Plan Note (Signed)
Mild,  No treatment indicated in the past. Repeating today

## 2019-10-25 NOTE — Progress Notes (Signed)
Subjective:  Patient ID: Emily Arellano, female    DOB: 06-27-1956  Age: 64 y.o. MRN: [id number]  CC: Diagnoses of Anxiety, generalized, Postsurgical hypothyroidism, and Pure hypercholesterolemia were pertinent to this visit.  HPI Emily Arellano presents for follow up on hypothyroidism, overweight  This visit occurred during the SARS-CoV-2 public health emergency.  Safety protocols were in place, including screening questions prior to the visit, additional usage of staff PPE, and extensive cleaning of exam room while observing appropriate contact time as indicated for disinfecting solutions.    Patient has received NO  doses of the available COVID 19 vaccines.   Patient continues to mask when outside of the home except when walking in yard or at safe distances from others .  Patient denies any change in mood or development of unhealthy behaviors resuting from the pandemic's restriction of activities and socialization.     2 yr old granddaughter was diagnosed  With B cell  ALL 2 weeks ago and is undergoing treatment currently.  Patient not sleeping well some nights on 30 mg temazepam.  Crying frequently .  Panic attacks.     Outpatient Medications Prior to Visit  Medication Sig Dispense Refill  . Cholecalciferol (VITAMIN D3) 2000 UNITS capsule Take by mouth.    . Cyanocobalamin (VITAMIN B12) 3000 MCG/ML LIQD Place under the tongue.    Marland Kitchen ELIDEL 1 % cream APPLY TO THE AFFECTED AREA ON THE SKIN EVERY DAY AS DIRECTED  3  . fluticasone (FLONASE) 50 MCG/ACT nasal spray Place into the nose.    Marland Kitchen MAGNESIUM PO Take 3-4 tablets by mouth at bedtime.    Marland Kitchen SYNTHROID 75 MCG tablet TAKE ONE TABLET ON AN EMPTY STOMACH WITHA GLASS OF WATER AT LEAST 30 TO 60 MINUTES BEFORE BREAKFAST 30 tablet 3  . temazepam (RESTORIL) 30 MG capsule TAKE ONE CAPSULE AT BEDTIME IF NEEDED FOR SLEEP 30 capsule 1  . omalizumab (XOLAIR) 150 MG/ML prefilled syringe Inject into the skin.    . ciprofloxacin (CIPRO) 250 MG tablet  Take 1 tablet (250 mg total) by mouth 2 (two) times daily. (Patient not taking: Reported on 10/25/2019) 6 tablet 0  . doxepin (SINEQUAN) 75 MG capsule Take 1 capsule (75 mg total) by mouth at bedtime. (Patient not taking: Reported on 10/25/2019) 30 capsule 5  . LOMAIRA 8 MG TABS TAKE 1 TABLET BY MOUTH TWICE DAILY AT 10AM AND 5PM (Patient not taking: Reported on 10/25/2019) 60 tablet 0  . montelukast (SINGULAIR) 10 MG tablet TAKE ONE TABLET BY MOUTH AT BEDTIME (Patient not taking: Reported on 10/25/2019) 90 tablet 1  . nitrofurantoin, macrocrystal-monohydrate, (MACROBID) 100 MG capsule Take 1 capsule (100 mg total) by mouth 2 (two) times daily. (Patient not taking: Reported on 10/25/2019) 10 capsule 0  . QSYMIA 3.75-23 MG CP24 TAKE 1 CAPSULE BY MOUTH DAILY FOR 14 DAYS, THEN INCREASE TO 2 CAPSULES DAILY THEREAFTER    . SAXENDA 18 MG/3ML SOPN INJECT 0.6 MG INTO THE SKIN DAILY. INCREASE DOSE WEEKLY AS FOLLOWS: WEEK 2: 1.2 MG DAILY WEEK 3: 1.8 MG DAILY WEEK 4: 2.4 MG DAILY (Patient not taking: Reported on 10/25/2019) 15 mL 2  . topiramate (TOPAMAX) 25 MG tablet Take 1 tablet (25 mg total) by mouth 2 (two) times daily. (Patient not taking: Reported on 10/25/2019) 60 tablet 2   No facility-administered medications prior to visit.    Review of Systems;  Patient denies headache, fevers, malaise, unintentional weight loss, skin rash, eye pain, sinus congestion and sinus  pain, sore throat, dysphagia,  hemoptysis , cough, dyspnea, wheezing, chest pain, palpitations, orthopnea, edema, abdominal pain, nausea, melena, diarrhea, constipation, flank pain, dysuria, hematuria, urinary  Frequency, nocturia, numbness, tingling, seizures,  Focal weakness, Loss of consciousness,  Tremor, insomnia, depression, anxiety, and suicidal ideation.      Objective:  BP 118/76 (BP Location: Left Arm, Patient Position: Sitting, Cuff Size: Normal)   Pulse 64   Temp (!) 97.4 F (36.3 C) (Temporal)   Resp 15   Ht 5\' 7"  (1.702 m)   Wt  178 lb (80.7 kg)   SpO2 99%   BMI 27.88 kg/m   BP Readings from Last 3 Encounters:  10/25/19 118/76  01/23/18 138/74  10/06/17 130/88    Wt Readings from Last 3 Encounters:  10/25/19 178 lb (80.7 kg)  11/30/18 183 lb (83 kg)  01/23/18 161 lb 9.6 oz (73.3 kg)    General appearance: alert, cooperative and appears stated age Ears: normal TM's and external ear canals both ears Throat: lips, mucosa, and tongue normal; teeth and gums normal Neck: no adenopathy, no carotid bruit, supple, symmetrical, trachea midline and thyroid not enlarged, symmetric, no tenderness/mass/nodules Back: symmetric, no curvature. ROM normal. No CVA tenderness. Lungs: clear to auscultation bilaterally Heart: regular rate and rhythm, S1, S2 normal, no murmur, click, rub or gallop Abdomen: soft, non-tender; bowel sounds normal; no masses,  no organomegaly Pulses: 2+ and symmetric Skin: Skin color, texture, turgor normal. No rashes or lesions Lymph nodes: Cervical, supraclavicular, and axillary nodes normal.  Lab Results  Component Value Date   HGBA1C 5.9 09/14/2018   HGBA1C 6.1 01/23/2018   HGBA1C 5.8 (H) 03/24/2017    Lab Results  Component Value Date   CREATININE 0.84 09/14/2018   CREATININE 0.90 01/23/2018   CREATININE 0.92 03/24/2017    Lab Results  Component Value Date   WBC 4.0 09/14/2018   HGB 13.1 09/14/2018   HCT 39.3 09/14/2018   PLT 331.0 09/14/2018   GLUCOSE 91 09/14/2018   CHOL 200 01/23/2018   TRIG 123.0 01/23/2018   HDL 55.10 01/23/2018   LDLCALC 120 (H) 01/23/2018   ALT 17 01/23/2018   AST 17 01/23/2018   NA 137 09/14/2018   K 3.9 09/14/2018   CL 101 09/14/2018   CREATININE 0.84 09/14/2018   BUN 16 09/14/2018   CO2 29 09/14/2018   TSH 2.93 09/14/2018   HGBA1C 5.9 09/14/2018    DG UGI W/HIGH DENSITY W/O KUB  Result Date: 03/07/2018 CLINICAL DATA:  Globus sensation, gastroesophageal reflux. Hoarseness. History of thyroid malignancy treated with radioactive iodine  nearly 30 years ago. EXAM: UPPER GI SERIES WITHOUT KUB TECHNIQUE: Routine upper GI series was performed with thin/high density/water soluble barium. Effervescent crystals and a barium tablet were administered. FLUOROSCOPY TIME:  Fluoroscopy Time:  1 minutes, 42 seconds Radiation Exposure Index (if provided by the fluoroscopic device): 28.6 mGy Number of Acquired Spot Images: 15+ 1 video loop. COMPARISON:  None in PACs FINDINGS: The patient ingested thick and thin barium and the effervescent crystals without difficulty. The hypopharynx distended well. There was no laryngeal penetration of the barium. The cervical esophagus distended well. The thoracic esophagus distended well but exhibited transient tertiary contractions in its mid and lower thirds. A small reducible hiatal hernia was observed with a moderate amount of gastroesophageal reflux occurring spontaneous to the level of the mid esophagus. No ulceration, stricture, or mass was observed. The stomach distended well. The mucosal fold pattern was normal. No ulcer niche was demonstrated. Gastric  emptying was prompt. The duodenal bulb and C sweep were normal in appearance. The barium tablet passed promptly from the mouth to the stomach. IMPRESSION: Normal appearing hypopharynx and cervical esophagus. No laryngeal penetration of the barium. Small reducible hiatal hernia with a small amount of spontaneous gastroesophageal reflux. No evidence of stricture nor esophagitis. Normal appearance of the stomach and duodenum. Electronically Signed   By: David  Martinique M.D.   On: 03/07/2018 08:54    Assessment & Plan:   Problem List Items Addressed This Visit      Unprioritized   Anxiety, generalized    Aggravated by granddaughter's life threatening illness.  Starting lexapro and adding rn xanax for panic attacks  .   I provided  30 minutes of  face-to-face time during this encounter counselling patient  On the  above mentioned problems , and coordination  of care  .      Relevant Medications   ALPRAZolam (XANAX) 0.25 MG tablet   escitalopram (LEXAPRO) 10 MG tablet   HLD (hyperlipidemia)    Mild,  No treatment indicated in the past. Repeating today       Postsurgical hypothyroidism    Checking thyroid level today   Lab Results  Component Value Date   TSH 2.93 09/14/2018            I have discontinued Deysy J. Bonfield's montelukast, doxepin, nitrofurantoin (macrocrystal-monohydrate), ciprofloxacin, Saxenda, topiramate, Lomaira, and Qsymia. I am also having her start on ALPRAZolam and escitalopram. Additionally, I am having her maintain her MAGNESIUM PO, Vitamin D3, Vitamin B12, fluticasone, Elidel, Synthroid, omalizumab, and temazepam.  Meds ordered this encounter  Medications  . temazepam (RESTORIL) 30 MG capsule    Sig: TAKE ONE CAPSULE AT BEDTIME IF NEEDED FOR SLEEP    Dispense:  30 capsule    Refill:  5  . ALPRAZolam (XANAX) 0.25 MG tablet    Sig: Take 1 tablet (0.25 mg total) by mouth 2 (two) times daily as needed (panic attacks).    Dispense:  30 tablet    Refill:  0  . escitalopram (LEXAPRO) 10 MG tablet    Sig: Take 1 tablet (10 mg total) by mouth daily.    Dispense:  30 tablet    Refill:  2    Medications Discontinued During This Encounter  Medication Reason  . ciprofloxacin (CIPRO) 250 MG tablet Completed Course  . doxepin (SINEQUAN) 75 MG capsule Patient has not taken in last 30 days  . LOMAIRA 8 MG TABS Patient has not taken in last 30 days  . montelukast (SINGULAIR) 10 MG tablet Patient has not taken in last 30 days  . nitrofurantoin, macrocrystal-monohydrate, (MACROBID) 100 MG capsule Completed Course  . QSYMIA 3.75-23 MG CP24 Patient has not taken in last 30 days  . SAXENDA 18 MG/3ML SOPN Patient has not taken in last 30 days  . topiramate (TOPAMAX) 25 MG tablet Patient has not taken in last 30 days  . temazepam (RESTORIL) 30 MG capsule Reorder    Follow-up: No follow-ups on file.   Crecencio Mc, MD

## 2019-11-05 ENCOUNTER — Telehealth: Payer: Self-pay

## 2019-11-05 NOTE — Telephone Encounter (Signed)
Patient has been advised of information per Dr. Nehemiah Massed.

## 2019-11-05 NOTE — Telephone Encounter (Signed)
Left message for patient to return my call.

## 2019-11-05 NOTE — Telephone Encounter (Signed)
Emily Arellano was broke out in hives yesterday but woke up clear this morning. She states she has been clear and stopped the injections because they made her feel very lethargic. Should she come in for a injection or since she is now clear should she wait?  She is under a lot of stress due to recent grand-daughters diagnosis of lukemia. She also states she broke out around this time last year and is questioning if the pollen/season change is related to hives.

## 2019-11-05 NOTE — Telephone Encounter (Signed)
If she is doing fine now, it would be OK to just see how she does.  Taking antihistamines would be a good idea, though (Allegra or Claritin). We can always inject with Xolair in the future if needed. Stress seems to be her major trigger for hives, but pollen season can also contribute.

## 2019-11-26 ENCOUNTER — Ambulatory Visit (INDEPENDENT_AMBULATORY_CARE_PROVIDER_SITE_OTHER): Payer: No Typology Code available for payment source | Admitting: Dermatology

## 2019-11-26 ENCOUNTER — Other Ambulatory Visit: Payer: Self-pay

## 2019-11-26 VITALS — BP 122/73 | HR 72

## 2019-11-26 DIAGNOSIS — L509 Urticaria, unspecified: Secondary | ICD-10-CM

## 2019-11-26 MED ORDER — OMALIZUMAB 150 MG/ML ~~LOC~~ SOSY
300.0000 mg | PREFILLED_SYRINGE | Freq: Once | SUBCUTANEOUS | Status: AC
  Administered 2019-11-26: 300 mg via SUBCUTANEOUS

## 2019-11-26 NOTE — Progress Notes (Signed)
   Follow-Up Visit   Subjective  Emily Arellano is a 64 y.o. female who presents for the following: urticaria (patient has been under recent stress with her granddaughter being diagnosed with leukemia. She is here today for her Xolair injections.). The patient has been off Xolair injections for about 3 months and did fine.  This recent stress has triggered new hives over the past week or so.  She has done well in the past with Xolair injections and would like to restart today.  The following portions of the chart were reviewed this encounter and updated as appropriate:  Tobacco  Allergies  Meds  Problems  Med Hx  Surg Hx  Fam Hx     Review of Systems:  No other skin or systemic complaints except as noted in HPI or Assessment and Plan.  Objective  Well appearing patient in no apparent distress; mood and affect are within normal limits.  A focused examination was performed including face, neck, chest and back and B/L arms and legs. Relevant physical exam findings are noted in the Assessment and Plan.  Objective  B/L forearms: Urticarial patches on the forearms   Assessment & Plan  Urticaria B/L forearms  Chronic urticaria - patient has struggled with this condition for many years. She is trying to decrease the frequency of Xolair injections because she states it makes her feel bad, although it does help control hives.  Continue Xolair 300mg /mL SQ Q6W. Xolair 150mg /mL injected into the B/L ant thigh today. Patient tolerated injections well. Lot [id number] Exp date 04/2020. AL, CMA  omalizumab Arvid Right) prefilled syringe 300 mg - B/L forearms  Return in about 6 weeks (around 01/07/2020).  Luther Redo, CMA, am acting as scribe for Sarina Ser, MD .  Documentation: I have reviewed the above documentation for accuracy and completeness, and I agree with the above.  Sarina Ser, MD

## 2019-11-27 ENCOUNTER — Encounter: Payer: Self-pay | Admitting: Dermatology

## 2020-01-23 LAB — HM MAMMOGRAPHY

## 2020-01-28 ENCOUNTER — Other Ambulatory Visit: Payer: Self-pay | Admitting: Internal Medicine

## 2020-03-31 ENCOUNTER — Other Ambulatory Visit: Payer: Self-pay | Admitting: Internal Medicine

## 2020-03-31 NOTE — Telephone Encounter (Signed)
Refill request for restoril, last seen 10-25-19, last filled 10-25-19.  Please advise.

## 2020-03-31 NOTE — Telephone Encounter (Signed)
Refill for 30 days only.  OFFICE VISIT NEEDED prior to any more refills. Please set up an appointment

## 2020-05-15 ENCOUNTER — Other Ambulatory Visit: Payer: Self-pay | Admitting: Internal Medicine

## 2020-05-29 ENCOUNTER — Other Ambulatory Visit: Payer: Self-pay | Admitting: Internal Medicine

## 2020-06-09 ENCOUNTER — Telehealth: Payer: Self-pay | Admitting: Internal Medicine

## 2020-06-09 DIAGNOSIS — E890 Postprocedural hypothyroidism: Secondary | ICD-10-CM

## 2020-06-09 DIAGNOSIS — E7800 Pure hypercholesterolemia, unspecified: Secondary | ICD-10-CM

## 2020-06-22 MED ORDER — SYNTHROID 75 MCG PO TABS: ORAL_TABLET | ORAL | 0 refills | Status: DC

## 2020-06-22 NOTE — Telephone Encounter (Signed)
Refilled for 90 days.  Please recommend that she schedule  a fasting lab appt pior to Jan 28 visit

## 2020-06-22 NOTE — Telephone Encounter (Signed)
Left detailed message for inform patient to return call back to schedule fasting lab appointment before 08-07-20

## 2020-06-22 NOTE — Addendum Note (Signed)
Addended by: Crecencio Mc on: 06/22/2020 01:24 PM   Modules accepted: Orders

## 2020-06-22 NOTE — Telephone Encounter (Signed)
Pt set up an appt for med management on 08/07/20. She states that she is doing well and would like a 90 day supply called in to the pharmacy.

## 2020-06-25 ENCOUNTER — Other Ambulatory Visit: Payer: Self-pay | Admitting: Internal Medicine

## 2020-07-29 ENCOUNTER — Other Ambulatory Visit: Payer: Self-pay | Admitting: Internal Medicine

## 2020-07-30 NOTE — Telephone Encounter (Signed)
RX Refill:restoril Last Seen:10-25-19 Last ordered:06-01-20

## 2020-08-05 ENCOUNTER — Other Ambulatory Visit: Payer: No Typology Code available for payment source

## 2020-08-05 ENCOUNTER — Other Ambulatory Visit (INDEPENDENT_AMBULATORY_CARE_PROVIDER_SITE_OTHER): Payer: No Typology Code available for payment source

## 2020-08-05 ENCOUNTER — Other Ambulatory Visit: Payer: Self-pay

## 2020-08-05 DIAGNOSIS — E7800 Pure hypercholesterolemia, unspecified: Secondary | ICD-10-CM | POA: Diagnosis not present

## 2020-08-05 DIAGNOSIS — E890 Postprocedural hypothyroidism: Secondary | ICD-10-CM

## 2020-08-05 LAB — COMPREHENSIVE METABOLIC PANEL
ALT: 25 U/L (ref 0–35)
AST: 21 U/L (ref 0–37)
Albumin: 4.4 g/dL (ref 3.5–5.2)
Alkaline Phosphatase: 88 U/L (ref 39–117)
BUN: 13 mg/dL (ref 6–23)
CO2: 30 mEq/L (ref 19–32)
Calcium: 8.6 mg/dL (ref 8.4–10.5)
Chloride: 101 mEq/L (ref 96–112)
Creatinine, Ser: 0.79 mg/dL (ref 0.40–1.20)
GFR: 79.65 mL/min (ref >60.00)
Glucose, Bld: 104 mg/dL — ABNORMAL HIGH (ref 70–99)
Potassium: 4.5 mEq/L (ref 3.5–5.1)
Sodium: 137 mEq/L (ref 135–145)
Total Bilirubin: 0.4 mg/dL (ref 0.2–1.2)
Total Protein: 6.9 g/dL (ref 6.0–8.3)

## 2020-08-05 LAB — LIPID PANEL
Cholesterol: 220 mg/dL — ABNORMAL HIGH (ref 0–200)
HDL: 73 mg/dL (ref >39.00)
LDL Cholesterol: 123 mg/dL — ABNORMAL HIGH (ref 0–99)
NonHDL: 146.62
Total CHOL/HDL Ratio: 3
Triglycerides: 118 mg/dL (ref 0.0–149.0)
VLDL: 23.6 mg/dL (ref 0.0–40.0)

## 2020-08-05 LAB — TSH: TSH: 1.57 u[IU]/mL (ref 0.35–4.50)

## 2020-08-06 ENCOUNTER — Other Ambulatory Visit: Payer: No Typology Code available for payment source

## 2020-08-07 ENCOUNTER — Telehealth (INDEPENDENT_AMBULATORY_CARE_PROVIDER_SITE_OTHER): Payer: No Typology Code available for payment source | Admitting: Internal Medicine

## 2020-08-07 ENCOUNTER — Encounter: Payer: Self-pay | Admitting: Internal Medicine

## 2020-08-07 VITALS — Ht 67.0 in | Wt 172.0 lb

## 2020-08-07 DIAGNOSIS — E890 Postprocedural hypothyroidism: Secondary | ICD-10-CM | POA: Diagnosis not present

## 2020-08-07 DIAGNOSIS — M542 Cervicalgia: Secondary | ICD-10-CM

## 2020-08-07 DIAGNOSIS — E7800 Pure hypercholesterolemia, unspecified: Secondary | ICD-10-CM

## 2020-08-07 DIAGNOSIS — R7303 Prediabetes: Secondary | ICD-10-CM | POA: Diagnosis not present

## 2020-08-07 DIAGNOSIS — F411 Generalized anxiety disorder: Secondary | ICD-10-CM

## 2020-08-07 DIAGNOSIS — E663 Overweight: Secondary | ICD-10-CM | POA: Diagnosis not present

## 2020-08-07 MED ORDER — TEMAZEPAM 30 MG PO CAPS: ORAL_CAPSULE | ORAL | 5 refills | Status: DC

## 2020-08-07 NOTE — Assessment & Plan Note (Signed)
Reviewed the merits of Optavia vs KETO diet with Optavia having a more favorable effect on glycemic control.  There is a strong FH of type 2 DM.  Continue some form of low glycemic index lifesttyle and encouarged to find a way to exercise regularly and focus less on the weight.  Return in 6 months for a1c

## 2020-08-07 NOTE — Assessment & Plan Note (Signed)
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 Managed with meloxicam and flexeril.  Advised to try adding tylenol and avoid gabapentin due to s/e

## 2020-08-07 NOTE — Assessment & Plan Note (Addendum)
Manifested primarily as insomnia and aggravated by concern for her young granddaughter who has leukemia .  Continue lexapro

## 2020-08-07 NOTE — Assessment & Plan Note (Signed)
Thyroid function is WNL on current dose of 75 mcg daily .  No current changes needed.   Lab Results  Component Value Date   TSH 1.57 08/05/2020

## 2020-08-07 NOTE — Progress Notes (Signed)
Virtual Visit converted to Telephone   This visit type was conducted due to national recommendations for restrictions regarding the COVID-19 pandemic (e.g. social distancing).  This format is felt to be most appropriate for this patient at this time.  All issues noted in this document were discussed and addressed.  No physical exam was performed (except for noted visual exam findings with Video Visits).   I connected with@ on 08/07/20 at  8:00 AM EST by a video enabled telemedicine application and verified that I am speaking with the correct person using two identifiers. Location patient: home Location provider: work or home office Persons participating in the virtual visit: patient, provider  I discussed the limitations, risks, security and privacy concerns of performing an evaluation and management service by telephone and the availability of in person appointments. I also discussed with the patient that there may be a patient responsible charge related to this service. The patient expressed understanding and agreed to proceed.  Interactive audio and video telecommunications were attempted between this provider and patient, however failed, due to patient having technical difficulties.  We continued and completed visit with audio only.   Reason for visit: FOLLOW UP on insomnia,  Prediabetes   HPI:  1) Overweight /prediabets:  lost 15 lbs using the [address] , did it slowly over one year.  Gained 10 lb back once she stopped the diet.  Not exercising,  Not following a low carb diet.  The [employer] diet was problematic because the bars/foods gave her a lot of gas  Not exercising fue to ankle pain and cervical disk disease.  Taking meloxicam and flexeril. Not taking tylenol.  Questions about gabapentin   Insomnia:  Using restroil and melatonin  Had covid  A month ago,  Unvaccinated .  Works with elderly, caught it from them.  Mild case  Headache only.   Repeat 6 months    ROS: See  pertinent positives and negatives per HPI.  Past Medical History:  Diagnosis Date  . Anxiety    "xanax prn"  . Heart murmur   . Hypothyroidism   . Osteoarthritis   . Thyroid cancer [facility])     Past Surgical History:  Procedure Laterality Date  . ABDOMINAL HYSTERECTOMY    . APPENDECTOMY  ~ 2005   "I think; when they got my gallbladder"  . BREAST SURGERY  2016   reduction   . CHOLECYSTECTOMY  ~ 2005  . HARVEST BONE GRAFT  02/23/2012   Procedure: HARVEST ILIAC BONE GRAFT;  Surgeon: Roseanne Kaufman, MD;  Location: [place];  Service: Orthopedics;  Laterality: N/A;  With Iliac Crest and BMP Bone Graft Fixation As Necessary Radial Nerve Release (Neurolysis as Necessary)   . 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 TENDON REPAIR  10/2011   "posterior; left foot"  . TONSILLECTOMY    . TOTAL THYROIDECTOMY  1993    and        radiation    Family History  Problem Relation Age of Onset  . Dementia Mother   . Hypertension Mother   . Diabetes Mother   . Cancer Father        lung  . Cancer Sister        bladder    SOCIAL HX:  reports that she has  never smoked. She has never used smokeless tobacco. She reports that she does not drink alcohol and does not use drugs.   Current Outpatient Medications:  .  Cholecalciferol (VITAMIN D3) 2000 UNITS capsule, Take by mouth., Disp: , Rfl:  .  Cyanocobalamin (VITAMIN B12) 3000 MCG/ML LIQD, Place under the tongue., Disp: , Rfl:  .  ELIDEL 1 % cream, APPLY TO THE AFFECTED AREA ON THE SKIN EVERY DAY AS DIRECTED, Disp: , Rfl: 3 .  escitalopram (LEXAPRO) 10 MG tablet, Take 1 tablet (10 mg total) by mouth daily., Disp: 30 tablet, Rfl: 2 .  fluticasone (FLONASE) 50 MCG/ACT nasal spray, Place into the nose., Disp: , Rfl:  .  MAGNESIUM PO,  Take 3-4 tablets by mouth at bedtime., Disp: , Rfl:  .  meloxicam (MOBIC) 15 MG tablet, TAKE 1 TABLET BY MOUTH DAILY WITH MEALS., Disp: 30 tablet, Rfl: 1 .  omalizumab (XOLAIR) 150 MG/ML prefilled syringe, Inject into the skin., Disp: , Rfl:  .  SYNTHROID 75 MCG tablet, TAKE ONE TABLET ON AN EMPTY STOMACH WITHA GLASS OF WATER AT LEAST 30 TO 60 MINUTES BEFORE BREAKFAST, Disp: 90 tablet, Rfl: 0 .  albuterol (VENTOLIN HFA) 108 (90 Base) MCG/ACT inhaler, Inhale into the lungs., Disp: , Rfl:  .  cyclobenzaprine (FLEXERIL) 5 MG tablet, cyclobenzaprine 5 mg tablet  Take 1 tablet 3 times a day by oral route., Disp: , Rfl:  .  [START ON 08/29/2020] temazepam (RESTORIL) 30 MG capsule, TAKE ONE CAPSULE AT BEDTIME IF NEEDED FOR SLEEP, Disp: 30 capsule, Rfl: 5  EXAM:   General impression: alert, cooperative and articulate.  No signs of being in distress  Lungs: speech is fluent sentence length suggests that patient is not short of breath and not punctuated by cough, sneezing or sniffing. Marland Kitchen   Psych: affect normal.  speech is articulate and non pressured .  Denies suicidal thoughts    ASSESSMENT AND PLAN:  Discussed the following assessment and plan:  Pure hypercholesterolemia - Plan: Lipid panel  Postsurgical hypothyroidism - Plan: TSH  Prediabetes - Plan: Hemoglobin A1c, Comprehensive metabolic panel  Overweight (BMI 25.0-29.9)  Anxiety, generalized  Cervicalgia  Overweight (BMI 25.0-29.9) Reviewed her transient success with Optavia diet , which she stopped due to 1) cost ($500/month for food)  2) subjective intolerance of bars DUE to gassiness.  Reviewed lifestyle,  Recommended low GI diet and regular participation in some form of exercise , limited somewhat by cervical spine DDD and ankle pain following a fracture.   Anxiety, generalized Manifested primarily as insomnia and aggravated by concern for her young granddaughter who has leukemia .  Continue lexapro   Cervicalgia Secondary to  osteophyte formation at C5-6 and C6-7 levels with suggestion of foraminal stenosis bilaterally. Managed with meloxicam and flexeril.  Advised to try adding tylenol and avoid gabapentin due to s/e  Postsurgical hypothyroidism Thyroid function is WNL on current dose of 75 mcg daily .  No current changes needed.   Lab Results  Component Value Date   TSH 1.57 08/05/2020     Prediabetes Reviewed the merits of Optavia vs KETO diet with Optavia having a more favorable effect on glycemic control.  There is a strong FH of type 2 DM.  Continue some form of low glycemic index lifesttyle and encouarged to find a way to exercise regularly and focus less on the weight.  Return in 6 months for a1c     I discussed the assessment and treatment plan with the patient. The  patient was provided an opportunity to ask questions and all were answered. The patient agreed with the plan and demonstrated an understanding of the instructions.   The patient was advised to call back or seek an in-person evaluation if the symptoms worsen or if the condition fails to improve as anticipated.  I provided  30 minutes of non-face-to-face time during this encounter reviewing patient's current problems and post surgeries.  Providing counseling on the above mentioned problems , and coordination  of care .  Crecencio Mc, MD

## 2020-08-07 NOTE — Assessment & Plan Note (Addendum)
Reviewed her transient success with Optavia diet , which she stopped due to 1) cost ($500/month for food)  2) subjective intolerance of bars DUE to gassiness.  Reviewed lifestyle,  Recommended low GI diet and regular participation in some form of exercise , limited somewhat by cervical spine DDD and ankle pain following a fracture.

## 2020-08-31 ENCOUNTER — Other Ambulatory Visit: Payer: Self-pay | Admitting: Internal Medicine

## 2020-08-31 NOTE — Telephone Encounter (Signed)
Last refill 08/07/20 and last OV same date ok to fill?

## 2020-09-28 ENCOUNTER — Other Ambulatory Visit: Payer: Self-pay | Admitting: Internal Medicine

## 2020-09-28 NOTE — Telephone Encounter (Signed)
RX Refill:restoril Last Seen:08-07-20 Last ordered:08-29-20

## 2020-10-08 ENCOUNTER — Other Ambulatory Visit: Payer: Self-pay | Admitting: Internal Medicine

## 2020-10-11 ENCOUNTER — Other Ambulatory Visit: Payer: Self-pay | Admitting: Internal Medicine

## 2020-10-14 ENCOUNTER — Other Ambulatory Visit: Payer: Self-pay | Admitting: Internal Medicine

## 2020-10-14 NOTE — Telephone Encounter (Signed)
Medication was DC 10/25/19 and last  OV 1/22 okay to fill Saxsenda PCP last note says to concentrate on exercise and not weight loss.

## 2020-10-22 ENCOUNTER — Other Ambulatory Visit: Payer: Self-pay

## 2020-10-22 ENCOUNTER — Ambulatory Visit (INDEPENDENT_AMBULATORY_CARE_PROVIDER_SITE_OTHER): Payer: No Typology Code available for payment source | Admitting: Internal Medicine

## 2020-10-22 ENCOUNTER — Encounter: Payer: Self-pay | Admitting: Internal Medicine

## 2020-10-22 VITALS — BP 124/68 | HR 72 | Temp 98.0°F | Resp 14 | Ht 67.0 in | Wt 187.6 lb

## 2020-10-22 DIAGNOSIS — R110 Nausea: Secondary | ICD-10-CM | POA: Diagnosis not present

## 2020-10-22 DIAGNOSIS — E7800 Pure hypercholesterolemia, unspecified: Secondary | ICD-10-CM

## 2020-10-22 DIAGNOSIS — R7303 Prediabetes: Secondary | ICD-10-CM | POA: Diagnosis not present

## 2020-10-22 DIAGNOSIS — E663 Overweight: Secondary | ICD-10-CM

## 2020-10-22 DIAGNOSIS — E890 Postprocedural hypothyroidism: Secondary | ICD-10-CM | POA: Diagnosis not present

## 2020-10-22 LAB — LIPID PANEL
Cholesterol: 188 mg/dL (ref 0–200)
HDL: 65.6 mg/dL (ref >39.00)
LDL Cholesterol: 104 mg/dL — ABNORMAL HIGH (ref 0–99)
NonHDL: 121.93
Total CHOL/HDL Ratio: 3
Triglycerides: 88 mg/dL (ref 0.0–149.0)
VLDL: 17.6 mg/dL (ref 0.0–40.0)

## 2020-10-22 LAB — COMPREHENSIVE METABOLIC PANEL
ALT: 21 U/L (ref 0–35)
AST: 18 U/L (ref 0–37)
Albumin: 4.1 g/dL (ref 3.5–5.2)
Alkaline Phosphatase: 82 U/L (ref 39–117)
BUN: 12 mg/dL (ref 6–23)
CO2: 30 mEq/L (ref 19–32)
Calcium: 8.4 mg/dL (ref 8.4–10.5)
Chloride: 102 mEq/L (ref 96–112)
Creatinine, Ser: 0.93 mg/dL (ref 0.40–1.20)
GFR: 65.39 mL/min (ref >60.00)
Glucose, Bld: 102 mg/dL — ABNORMAL HIGH (ref 70–99)
Potassium: 4.5 mEq/L (ref 3.5–5.1)
Sodium: 139 mEq/L (ref 135–145)
Total Bilirubin: 0.4 mg/dL (ref 0.2–1.2)
Total Protein: 6.6 g/dL (ref 6.0–8.3)

## 2020-10-22 LAB — TSH: TSH: 2 u[IU]/mL (ref 0.35–4.50)

## 2020-10-22 LAB — HEMOGLOBIN A1C: Hgb A1c MFr Bld: 6.1 % (ref 4.6–6.5)

## 2020-10-22 MED ORDER — SEMAGLUTIDE-WEIGHT MANAGEMENT 0.25 MG/0.5ML ~~LOC~~ SOAJ: 0.2500 mg | SUBCUTANEOUS | 1 refills | Status: AC

## 2020-10-22 MED ORDER — SEMAGLUTIDE-WEIGHT MANAGEMENT 1.7 MG/0.75ML ~~LOC~~ SOAJ
1.7000 mg | SUBCUTANEOUS | 0 refills | Status: DC
Start: 2021-01-17 — End: 2020-12-24

## 2020-10-22 MED ORDER — SEMAGLUTIDE-WEIGHT MANAGEMENT 1 MG/0.5ML ~~LOC~~ SOAJ
1.0000 mg | SUBCUTANEOUS | 0 refills | Status: DC
Start: 2020-12-19 — End: 2020-12-24

## 2020-10-22 MED ORDER — SEMAGLUTIDE-WEIGHT MANAGEMENT 2.4 MG/0.75ML ~~LOC~~ SOAJ: 2.4000 mg | SUBCUTANEOUS | 0 refills | Status: DC

## 2020-10-22 MED ORDER — OZEMPIC (0.25 OR 0.5 MG/DOSE) 2 MG/1.5ML ~~LOC~~ SOPN: 0.5000 mg | PEN_INJECTOR | SUBCUTANEOUS | 3 refills | Status: DC

## 2020-10-22 MED ORDER — CYCLOBENZAPRINE HCL 10 MG PO TABS
10.0000 mg | ORAL_TABLET | Freq: Three times a day (TID) | ORAL | 1 refills | Status: AC
Start: 2020-10-22 — End: ?

## 2020-10-22 MED ORDER — SEMAGLUTIDE-WEIGHT MANAGEMENT 0.5 MG/0.5ML ~~LOC~~ SOAJ: 0.5000 mg | SUBCUTANEOUS | 0 refills | Status: AC

## 2020-10-22 NOTE — Progress Notes (Signed)
Subjective:  Patient ID: Emily Arellano, female    DOB: [birth year]  Age: 65 y.o. MRN: [id number]  CC: Diagnoses of Postsurgical hypothyroidism, Pure hypercholesterolemia, Prediabetes, and Overweight (BMI 25.0-29.9) were pertinent to this visit.  HPI Emily Arellano presents for management of overweight state.  This visit occurred during the SARS-CoV-2 public health emergency.  Safety protocols were in place, including screening questions prior to the visit, additional usage of staff PPE, and extensive cleaning of exam room while observing appropriate contact time as indicated for disinfecting solutions.   Emily Arellano has gained 15 lbs since January and is requesting pharmacotherapy for management  Previous use of Saxenda was helpful but too expensive (2018) phentermine was taken for 4 months from March to July 2019 but stopped due to elevated BP and length of therapy exceeding 3 months   KETO diets and optavia diets were transiently successful but stopped due to cost of food.     Outpatient Medications Prior to Visit  Medication Sig Dispense Refill  . Cholecalciferol (VITAMIN D3) 2000 UNITS capsule Take by mouth.    . Cyanocobalamin (VITAMIN B12) 3000 MCG/ML LIQD Place under the tongue.    Marland Kitchen ELIDEL 1 % cream APPLY TO THE AFFECTED AREA ON THE SKIN EVERY DAY AS DIRECTED  3  . fluticasone (FLONASE) 50 MCG/ACT nasal spray Place into the nose.    Marland Kitchen MAGNESIUM PO Take 3-4 tablets by mouth at bedtime.    . meloxicam (MOBIC) 15 MG tablet TAKE 1 TABLET BY MOUTH DAILY WITH MEALS. 30 tablet 1  . SYNTHROID 75 MCG tablet TAKE ONE TABLET ON AN EMPTY STOMACH WITHA GLASS OF WATER AT LEAST 30 TO 60 MINUTES BEFORE BREAKFAST PT NEEDS APPOINTMENT 30 tablet 1  . temazepam (RESTORIL) 30 MG capsule TAKE ONE CAPSULE AT BEDTIME IF NEEDED FOR SLEEP 30 capsule 5  . cyclobenzaprine (FLEXERIL) 5 MG tablet TAKE 1 TABKET BY MOUTH 3 TIMES A DAY. 30 tablet 5  . albuterol (VENTOLIN HFA) 108 (90 Base) MCG/ACT inhaler Inhale  into the lungs. (Patient not taking: Reported on 10/22/2020)    . escitalopram (LEXAPRO) 10 MG tablet Take 1 tablet (10 mg total) by mouth daily. (Patient not taking: Reported on 10/22/2020) 30 tablet 2  . omalizumab Arvid Right) 150 MG/ML prefilled syringe Inject into the skin. (Patient not taking: Reported on 10/22/2020)     No facility-administered medications prior to visit.    Review of Systems;  Patient denies headache, fevers, malaise, unintentional weight loss, skin rash, eye pain, sinus congestion and sinus pain, sore throat, dysphagia,  hemoptysis , cough, dyspnea, wheezing, chest pain, palpitations, orthopnea, edema, abdominal pain, nausea, melena, diarrhea, constipation, flank pain, dysuria, hematuria, urinary  Frequency, nocturia, numbness, tingling, seizures,  Focal weakness, Loss of consciousness,  Tremor, insomnia, depression, anxiety, and suicidal ideation.      Objective:  BP 124/68 (BP Location: Left Arm, Patient Position: Sitting, Cuff Size: Large)   Pulse 72   Temp 98 F (36.7 C) (Oral)   Resp 14   Ht 5\' 7"  (1.702 m)   Wt 187 lb 9.6 oz (85.1 kg)   SpO2 99%   BMI 29.38 kg/m   BP Readings from Last 3 Encounters:  10/22/20 124/68  11/26/19 122/73  10/25/19 118/76    Wt Readings from Last 3 Encounters:  10/22/20 187 lb 9.6 oz (85.1 kg)  08/07/20 172 lb (78 kg)  10/25/19 178 lb (80.7 kg)    General appearance: alert, cooperative and appears stated age Ears: normal TM's  and external ear canals both ears Throat: lips, mucosa, and tongue normal; teeth and gums normal Neck: no adenopathy, no carotid bruit, supple, symmetrical, trachea midline and thyroid not enlarged, symmetric, no tenderness/mass/nodules Back: symmetric, no curvature. ROM normal. No CVA tenderness. Lungs: clear to auscultation bilaterally Heart: regular rate and rhythm, S1, S2 normal, no murmur, click, rub or gallop Abdomen: soft, non-tender; bowel sounds normal; no masses,  no organomegaly Pulses:  2+ and symmetric Skin: Skin color, texture, turgor normal. No rashes or lesions Lymph nodes: Cervical, supraclavicular, and axillary nodes normal.  Lab Results  Component Value Date   HGBA1C 6.1 10/22/2020   HGBA1C 5.9 09/14/2018   HGBA1C 6.1 01/23/2018    Lab Results  Component Value Date   CREATININE 0.93 10/22/2020   CREATININE 0.79 08/05/2020   CREATININE 0.90 10/25/2019    Lab Results  Component Value Date   WBC 4.0 09/14/2018   HGB 13.1 09/14/2018   HCT 39.3 09/14/2018   PLT 331.0 09/14/2018   GLUCOSE 102 (H) 10/22/2020   CHOL 188 10/22/2020   TRIG 88.0 10/22/2020   HDL 65.60 10/22/2020   LDLCALC 104 (H) 10/22/2020   ALT 21 10/22/2020   AST 18 10/22/2020   NA 139 10/22/2020   K 4.5 10/22/2020   CL 102 10/22/2020   CREATININE 0.93 10/22/2020   BUN 12 10/22/2020   CO2 30 10/22/2020   TSH 2.00 10/22/2020   HGBA1C 6.1 10/22/2020     Assessment & Plan:   Problem List Items Addressed This Visit      Unprioritized   Prediabetes    A1c continues to rise , accompanied by weight gain.  Agree with trial of Ozempic or Wegovy to increase satiety and assist with weight loss.  Exercise regimens discussed at length,  Which will be challenging due to orthopedic issues.       Postsurgical hypothyroidism    Thyroid function is WNL on current dose of 75 mcg daily .  No current changes needed.   Lab Results  Component Value Date   TSH 2.00 10/22/2020         Overweight (BMI 25.0-29.9)    Reviewed her transient success with Optavia diet , which she stopped due to 1) cost ($500/month for food)  2) subjective intolerance of bars DUE to gassiness.  Reviewed lifestyle,  Recommended low GI diet and regular participation in some form of exercise , limited somewhat by cervical spine DDD and ankle pain following a fracture. Agree she would benefit from [facility] or Ozempic given her increased risk for diabetes      HLD (hyperlipidemia)     A total of 40 minutes was spent with  patient more than half of which was spent in counseling patient on the above mentioned issues , reviewing and explaining recent labs and imaging studies done, and coordination of care. I have changed Emily Arellano's cyclobenzaprine. I am also having her start on Semaglutide-Weight Management, Semaglutide-Weight Management, Semaglutide-Weight Management, Semaglutide-Weight Management, Semaglutide-Weight Management, and Ozempic (0.25 or 0.5 MG/DOSE). Additionally, I am having her maintain her MAGNESIUM PO, Vitamin D3, Vitamin B12, fluticasone, Elidel, omalizumab, escitalopram, meloxicam, albuterol, temazepam, and Synthroid.  Meds ordered this encounter  Medications  . cyclobenzaprine (FLEXERIL) 10 MG tablet    Sig: Take 1 tablet (10 mg total) by mouth 3 (three) times daily.    Dispense:  270 tablet    Refill:  1  . Semaglutide-Weight Management 0.25 MG/0.5ML SOAJ    Sig: Inject 0.25 mg into the  skin once a week for 28 days.    Dispense:  2 mL    Refill:  1  . Semaglutide-Weight Management 0.5 MG/0.5ML SOAJ    Sig: Inject 0.5 mg into the skin once a week for 28 days.    Dispense:  2 mL    Refill:  0  . Semaglutide-Weight Management 1 MG/0.5ML SOAJ    Sig: Inject 1 mg into the skin once a week for 28 days.    Dispense:  2 mL    Refill:  0  . Semaglutide-Weight Management 1.7 MG/0.75ML SOAJ    Sig: Inject 1.7 mg into the skin once a week for 28 days.    Dispense:  3 mL    Refill:  0  . Semaglutide-Weight Management 2.4 MG/0.75ML SOAJ    Sig: Inject 2.4 mg into the skin once a week for 28 days.    Dispense:  3 mL    Refill:  0  . Semaglutide,0.25 or 0.5MG /DOS, (OZEMPIC, 0.25 OR 0.5 MG/DOSE,) 2 MG/1.5ML SOPN    Sig: Inject 0.5 mg into the skin once a week.    Dispense:  1.5 mL    Refill:  3    Medications Discontinued During This Encounter  Medication Reason  . cyclobenzaprine (FLEXERIL) 5 MG tablet Reorder    Follow-up: No follow-ups on file.   Crecencio Mc, MD

## 2020-10-25 NOTE — Assessment & Plan Note (Signed)
Thyroid function is WNL on current dose of 75 mcg daily .  No current changes needed.   Lab Results  Component Value Date   TSH 2.00 10/22/2020

## 2020-10-25 NOTE — Assessment & Plan Note (Signed)
Reviewed her transient success with Optavia diet , which she stopped due to 1) cost ($500/month for food)  2) subjective intolerance of bars DUE to gassiness.  Reviewed lifestyle,  Recommended low GI diet and regular participation in some form of exercise , limited somewhat by cervical spine DDD and ankle pain following a fracture. Agree she would benefit from [facility] or Ozempic given her increased risk for diabetes

## 2020-10-25 NOTE — Assessment & Plan Note (Signed)
A1c continues to rise , accompanied by weight gain.  Agree with trial of Ozempic or Wegovy to increase satiety and assist with weight loss.  Exercise regimens discussed at length,  Which will be challenging due to orthopedic issues.

## 2020-10-26 ENCOUNTER — Other Ambulatory Visit: Payer: Self-pay | Admitting: Internal Medicine

## 2020-11-06 ENCOUNTER — Ambulatory Visit: Payer: No Typology Code available for payment source | Admitting: Internal Medicine

## 2020-11-26 ENCOUNTER — Other Ambulatory Visit: Payer: Self-pay | Admitting: Internal Medicine

## 2020-12-24 ENCOUNTER — Other Ambulatory Visit: Payer: Self-pay

## 2020-12-24 ENCOUNTER — Ambulatory Visit (INDEPENDENT_AMBULATORY_CARE_PROVIDER_SITE_OTHER): Payer: No Typology Code available for payment source | Admitting: Internal Medicine

## 2020-12-24 ENCOUNTER — Encounter: Payer: Self-pay | Admitting: Internal Medicine

## 2020-12-24 DIAGNOSIS — E663 Overweight: Secondary | ICD-10-CM | POA: Diagnosis not present

## 2020-12-24 MED ORDER — METFORMIN HCL 500 MG PO TABS: 500.0000 mg | ORAL_TABLET | Freq: Two times a day (BID) | ORAL | 3 refills | Status: DC

## 2020-12-24 MED ORDER — TOPIRAMATE 25 MG PO TABS: 25.0000 mg | ORAL_TABLET | Freq: Two times a day (BID) | ORAL | 2 refills | Status: DC
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
## 2020-12-27 NOTE — Assessment & Plan Note (Signed)
She tolerated Ozempic and appreciated a 12 lb weight loss during the last 2-3 months but the OOP cost is $1300/month.  Alternatives discussed, diet and exercise routine reviewed.    Trial of metformin and topomax

## 2021-02-20 ENCOUNTER — Other Ambulatory Visit: Payer: Self-pay | Admitting: Internal Medicine

## 2021-02-22 ENCOUNTER — Other Ambulatory Visit: Payer: Self-pay

## 2021-02-22 ENCOUNTER — Ambulatory Visit
Admission: EM | Admit: 2021-02-22 | Discharge: 2021-02-22 | Disposition: A | Discharge status: Home / Self Care | Payer: No Typology Code available for payment source | Attending: Emergency Medicine | Admitting: Emergency Medicine

## 2021-02-22 ENCOUNTER — Encounter: Payer: Self-pay | Admitting: Emergency Medicine

## 2021-02-22 DIAGNOSIS — N390 Urinary tract infection, site not specified: Secondary | ICD-10-CM

## 2021-02-22 LAB — POCT URINALYSIS DIP (MANUAL ENTRY)
Bilirubin, UA: NEGATIVE
Glucose, UA: 100 mg/dL — AB
Ketones, POC UA: NEGATIVE mg/dL
Nitrite, UA: POSITIVE — AB
Protein Ur, POC: NEGATIVE mg/dL
Spec Grav, UA: <=1.005 — AB (ref 1.010–1.025)
Urobilinogen, UA: 1 E.U./dL
pH, UA: 5 (ref 5.0–8.0)

## 2021-02-22 MED ORDER — CIPROFLOXACIN HCL 500 MG PO TABS: 500.0000 mg | ORAL_TABLET | Freq: Two times a day (BID) | ORAL | 0 refills | Status: AC

## 2021-02-22 NOTE — ED Triage Notes (Signed)
Pt here with 10 days of burning while urinating and back pain. Hx of recurring UTI. Took 3 leftover amoxicillin pills.

## 2021-02-22 NOTE — Discharge Instructions (Signed)

## 2021-02-22 NOTE — ED Provider Notes (Signed)
Subjective:    Emily Arellano is a very pleasant 65 y.o. female who presents with concerns for UTI due to dysuria over the last 10 days.  Patient reports lower back pain bilaterally.  Patient reports that she has taken 3 amoxicillin tablets which were leftover from a previous visit.  Patient states that she has had a low-dose Macrobid prescribed to her in the past for control of UTIs and is concerned that she may need something stronger. No unilateral back pain, vomiting, fever, vaginal discharge, concern for STD.  Past medical history, past surgical history, current medications reviewed.  Allergies: is allergic to sulfa antibiotics, hydrocodone, codeine, doxycycline hyclate, and sulfamethoxazole-trimethoprim.  Review of Systems See HPI   Objective:     Vitals:   02/22/21 1636  BP: (!) 168/64  Pulse: 89  Resp: 20  Temp: 98.6 F (37 C)  SpO2: 100%     General: Appears well-developed and well-nourished. No acute distress.  Cardiovascular: Normal rate  Pulm/Chest: No respiratory distress Neurological: Alert and oriented to person, place, and time.  Skin: Skin is warm and dry.  Psychiatric: Normal mood, affect, behavior, and thought content.  GU:  Deferred secondary to self collect specimen  Laboratory:  Orders Placed This Encounter  Procedures   Urine Culture   POCT urinalysis dipstick   Results for orders placed or performed during the hospital encounter of 02/22/21  POCT urinalysis dipstick  Result Value Ref Range   Color, UA orange (A) yellow   Clarity, UA cloudy (A) clear   Glucose, UA =100 (A) negative mg/dL   Bilirubin, UA negative negative   Ketones, POC UA negative negative mg/dL   Spec Grav, UA <=1.005 (A) 1.010 - 1.025   Blood, UA trace-intact (A) negative   pH, UA 5.0 5.0 - 8.0   Protein Ur, POC negative negative mg/dL   Urobilinogen, UA 1.0 0.2 or 1.0 E.U./dL   Nitrite, UA Positive (A) Negative   Leukocytes, UA Large (3+) (A) Negative    -Orange cloudy  urine, 100 glucose, decreased specific gravity, trace blood, positive nitrite, large leukocytes. -Urine culture pending. Assessment:   1. Acute UTI - Urine Culture; Standing - Urine Culture - ciprofloxacin (CIPRO) 500 MG tablet; Take 1 tablet (500 mg total) by mouth 2 (two) times daily for 7 days.  Dispense: 14 tablet; Refill: 0   Plan:   MDM: Patient presents with concerns for UTI due to dysuria over the last 10 days.  Patient reports lower back pain bilaterally.  Patient reports that she has taken 3 amoxicillin tablets which were leftover from a previous visit.  Patient states that she has had a low-dose Macrobid prescribed to her in the past for control of UTIs and is concerned that she may need something stronger. No unilateral back pain, vomiting, fever, vaginal discharge, concern for STD.  Chart review completed.  Urinalysis reveals orange cloudy urine, 100 glucose, decreased specific gravity, trace blood, positive nitrite, large leukocytes.  Urine culture pending.  Given symptoms and urinalysis results, likely acute UTI.  Spoke with the patient about antibiotic management and she states that she has tolerated ciprofloxacin well in the past.  He recently urine culture from 2020 showed E. coli which is pansensitive.  Rx ciprofloxacin to the patient's preferred pharmacy twice daily for the next 7 days.  Advised of strict return precautions suggestive of complicated UTI/pyelonephritis to include unilateral back pain, fever and vomiting.  Patient verbalized understanding and agreed with plan.  Patient stable upon discharge.  Discharge Instructions      You were seen today for an infection in the lower urinary tract. Your urine sample today was sent for a culture. The urine culture will show what type of bacteria grows and if you are on the appropriate antibiotic. If the antibiotic needs to be changed, you will receive a phone call from the follow up nurse who will give you more information. If  you do not receive a call, then you are on the correct antibiotic.  Take antibiotics as directed. Finish course even if feeling better sooner. Drink plenty of clear fluids. Over the counter "Uristat" or "Azo Standard" may help your discomfort.  This will turn your urine dark orange or red and may stain contacts (remove before using). You may experience 24 - 48 hours of continuing discomfort until medication controls the infection.  Return to clinic or go to the ER if you develop a fever, one-sided back pain, or vomiting as these are signs of a worsening infection.         Serafina Royals, [place] 02/22/21 1700

## 2021-02-24 ENCOUNTER — Other Ambulatory Visit: Payer: Self-pay | Admitting: Internal Medicine

## 2021-02-24 NOTE — Telephone Encounter (Signed)
RX Refill:restoril Last Seen:12-24-20 Last ordered:09-28-20

## 2021-02-25 LAB — URINE CULTURE: Culture: 80000 — AB

## 2021-03-14 ENCOUNTER — Other Ambulatory Visit: Payer: Self-pay

## 2021-03-14 ENCOUNTER — Ambulatory Visit
Admission: EM | Admit: 2021-03-14 | Discharge: 2021-03-14 | Disposition: A | Discharge status: Home / Self Care | Payer: No Typology Code available for payment source | Attending: Emergency Medicine | Admitting: Emergency Medicine

## 2021-03-14 DIAGNOSIS — N390 Urinary tract infection, site not specified: Secondary | ICD-10-CM | POA: Insufficient documentation

## 2021-03-14 LAB — POCT URINALYSIS DIP (MANUAL ENTRY)
Bilirubin, UA: NEGATIVE
Blood, UA: NEGATIVE
Glucose, UA: NEGATIVE mg/dL
Ketones, POC UA: NEGATIVE mg/dL
Leukocytes, UA: NEGATIVE
Nitrite, UA: POSITIVE — AB
Protein Ur, POC: NEGATIVE mg/dL
Spec Grav, UA: 1.02 (ref 1.010–1.025)
Urobilinogen, UA: 0.2 E.U./dL
pH, UA: 6 (ref 5.0–8.0)

## 2021-03-14 MED ORDER — PHENAZOPYRIDINE HCL 200 MG PO TABS: 200.0000 mg | ORAL_TABLET | Freq: Three times a day (TID) | ORAL | 0 refills | Status: AC

## 2021-03-14 MED ORDER — CIPROFLOXACIN HCL 500 MG PO TABS: 500.0000 mg | ORAL_TABLET | Freq: Two times a day (BID) | ORAL | 0 refills | Status: AC

## 2021-03-14 NOTE — ED Triage Notes (Signed)
Patient presents to Urgent Care with complaints of possible UTI. She states back pain since yesterday. Has a hx of UTIs. Treated symptoms with AZO.   Denies fever, hematuria, or n/v.

## 2021-03-14 NOTE — ED Provider Notes (Signed)
Subjective:    Emily Arellano is a very pleasant 65 y.o. female who presents with concerns for UTI due to low back pain that started yesterday.  Patient reports a history of UTIs and was treated in clinic last month with ciprofloxacin.  Patient has been treating symptoms with Azo.  Patient has recently started metformin and is concerned that this may be causing her recurrent UTIs. Patient reports that she may stop taking this metformin as it is only prescribed to her for weight loss. No unilateral back pain, vomiting, fever, hematuria, vaginal discharge, concern for STD.  Past medical history, past surgical history, current medications reviewed.  Allergies: is allergic to sulfa antibiotics, hydrocodone, codeine, doxycycline hyclate, and sulfamethoxazole-trimethoprim.  Review of Systems See HPI   Objective:     Vitals:   03/14/21 0846  BP: 140/79  Pulse: 63  Resp: 16  Temp: 98.4 F (36.9 C)  SpO2: 97%     General: Appears well-developed and well-nourished. No acute distress.  Cardiovascular: Normal rate  Pulm/Chest: No respiratory distress. Neurological: Alert and oriented to person, place, and time.  Skin: Skin is warm and dry.  Psychiatric: Normal mood, affect, behavior, and thought content.  GU:  Deferred secondary to self-collect specimen  Laboratory:  Orders Placed This Encounter  Procedures   Urine Culture   POCT urinalysis dipstick   Results for orders placed or performed during the hospital encounter of 03/14/21  POCT urinalysis dipstick  Result Value Ref Range   Color, UA yellow yellow   Clarity, UA clear clear   Glucose, UA negative negative mg/dL   Bilirubin, UA negative negative   Ketones, POC UA negative negative mg/dL   Spec Grav, UA 1.020 1.010 - 1.025   Blood, UA negative negative   pH, UA 6.0 5.0 - 8.0   Protein Ur, POC negative negative mg/dL   Urobilinogen, UA 0.2 0.2 or 1.0 E.U./dL   Nitrite, UA Positive (A) Negative   Leukocytes, UA Negative  Negative     Assessment:   1. Acute UTI - Urine Culture; Standing - Urine Culture - ciprofloxacin (CIPRO) 500 MG tablet; Take 1 tablet (500 mg total) by mouth 2 (two) times daily for 7 days.  Dispense: 14 tablet; Refill: 0 - phenazopyridine (PYRIDIUM) 200 MG tablet; Take 1 tablet (200 mg total) by mouth 3 (three) times daily for 2 days.  Dispense: 6 tablet; Refill: 0  Plan:   MDM: Patient presents with concerns for UTI due to low back pain that started yesterday.  Patient reports a history of UTIs and was treated in clinic last month with ciprofloxacin.  Patient has been treating symptoms with Azo.  Patient has recently started metformin and is concerned that this may be causing her recurrent UTIs. Patient reports that she may stop taking this metformin as it is only prescribed to her for weight loss. No unilateral back pain, vomiting, fever, hematuria, vaginal discharge, concern for STD.  Chart review completed.  Urinalysis reveals nitrite positive urine.  Urine culture pending.  Given symptoms, likely acute UTI.  Rx'd ciprofloxacin to the patient's preferred pharmacy along with [place].  Increase fluid intake.  Return to clinic or go to the ER for any fever, unilateral back pain or vomiting.  Patient verbalized understanding and agreed with plan.  Patient stable upon discharge.  Return as needed.    Discharge Instructions      You were seen today for an infection in the lower urinary tract. Your urine sample today was sent  for a culture. The urine culture will show what type of bacteria grows and if you are on the appropriate antibiotic. If the antibiotic needs to be changed, you will receive a phone call from the follow up nurse who will give you more information. If you do not receive a call, then you are on the correct antibiotic.  Take antibiotics as directed. Finish course even if feeling better sooner. Drink plenty of clear fluids. Over the counter "Uristat" or "Azo Standard" may  help your discomfort.  This will turn your urine dark orange or red and may stain contacts (remove before using). You may experience 24 - 48 hours of continuing discomfort until medication controls the infection.  Return to clinic or go to the ER if you develop a fever, one-sided back pain, or vomiting as these are signs of a worsening infection.          Serafina Royals, [place] 03/14/21 [phone]

## 2021-03-14 NOTE — Discharge Instructions (Addendum)

## 2021-03-15 LAB — URINE CULTURE: Culture: <10000 — AB

## 2021-03-18 ENCOUNTER — Other Ambulatory Visit: Payer: Self-pay

## 2021-03-18 ENCOUNTER — Encounter: Payer: Self-pay | Admitting: Urology

## 2021-03-18 ENCOUNTER — Ambulatory Visit (INDEPENDENT_AMBULATORY_CARE_PROVIDER_SITE_OTHER): Payer: No Typology Code available for payment source | Admitting: Urology

## 2021-03-18 VITALS — BP 136/70 | HR 89 | Ht 66.0 in | Wt 177.0 lb

## 2021-03-18 DIAGNOSIS — N390 Urinary tract infection, site not specified: Secondary | ICD-10-CM

## 2021-03-18 LAB — URINALYSIS, COMPLETE
Bilirubin, UA: NEGATIVE
Glucose, UA: NEGATIVE
Ketones, UA: NEGATIVE
Leukocytes,UA: NEGATIVE
Nitrite, UA: NEGATIVE
Protein,UA: NEGATIVE
RBC, UA: NEGATIVE
Specific Gravity, UA: 1.015 (ref 1.005–1.030)
Urobilinogen, Ur: 0.2 mg/dL (ref 0.2–1.0)
pH, UA: 7.5 (ref 5.0–7.5)

## 2021-03-18 LAB — MICROSCOPIC EXAMINATION: Bacteria, UA: NONE SEEN

## 2021-03-18 NOTE — Progress Notes (Signed)
03/18/2021 8:27 AM   [name] [birth year] [id number]  Referring provider: Crecencio Mc, MD [place],  [place] [address]  Chief Complaint  Patient presents with   Recurrent UTI    HPI: Emily Arellano is a 65 y.o. female self-referred for back pain and voiding difficulty.  Seen [facility] urgent care 02/22/2021 with a 10-day history of dysuria and low back pain UA nitrite positive with large leukocytes; treated with a 7-day course of Cipro; urine culture returned pansensitive Citrobacter koseri with resolution of symptoms Return visit 03/14/2021 with 1 day history of low back pain and dysuria Dipstick urinalysis was nitrite positive with no leukocytes or blood She was retreated with Cipro however urine culture had insignificant growth Since that visit she states her symptoms have improved on Cipro Possible relation of symptoms to intercourse  Her pain is low back below the waist-no flank pain No fever, chills or gross hematuria Prior history of recurrent UTIs >10 years ago TAH/BSO 2005   PMH: Past Medical History:  Diagnosis Date   Anxiety    "xanax prn"   Heart murmur    Hypothyroidism    Osteoarthritis    Thyroid cancer [facility])     Surgical History: Past Surgical History:  Procedure Laterality Date   ABDOMINAL HYSTERECTOMY     APPENDECTOMY  ~ 2005   "I think; when they got my gallbladder"   BREAST SURGERY  2016   reduction    CHOLECYSTECTOMY  ~ 2005   HARVEST BONE GRAFT  02/23/2012   Procedure: HARVEST ILIAC BONE GRAFT;  Surgeon: Roseanne Kaufman, MD;  Location: [facility];  Service: Orthopedics;  Laterality: N/A;  With Iliac Crest and BMP Bone Graft Fixation As Necessary Radial Nerve Release (Neurolysis as Necessary)    ORIF HUMERUS FRACTURE  02/23/2012   Procedure: OPEN REDUCTION INTERNAL FIXATION (ORIF) DISTAL HUMERUS FRACTURE;  Surgeon: Roseanne Kaufman, MD;  Location: [facility];  Service: Orthopedics;  Laterality: Left;  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 If you have any questions, ask your nurse or doctor.          albuterol 108 (90 Base) MCG/ACT inhaler Commonly known as: VENTOLIN HFA Inhale into the lungs.   ciprofloxacin 500 MG tablet Commonly known as: CIPRO Take 1 tablet (500 mg total) by mouth 2 (two) times daily for 7 days.   cyclobenzaprine 10 MG tablet Commonly known as: FLEXERIL Take 1 tablet (10 mg total) by mouth 3 (three) times daily.   Elidel 1 % cream Generic drug: pimecrolimus APPLY TO THE AFFECTED AREA ON THE SKIN EVERY DAY AS DIRECTED   escitalopram 10 MG tablet Commonly known as: LEXAPRO Take 1 tablet (10 mg total) by mouth daily.   fluticasone 50 MCG/ACT nasal spray Commonly known as: FLONASE Place into the nose.   MAGNESIUM PO Take 3-4 tablets by mouth at  bedtime.   meloxicam 15 MG tablet Commonly known as: MOBIC TAKE 1 TABLET BY MOUTH DAILY WITH A MEAL   metFORMIN 500 MG tablet Commonly known as: GLUCOPHAGE Take 1 tablet (500 mg total) by mouth 2 (two) times daily with a meal.   omalizumab 150 MG/ML prefilled syringe Commonly known as: XOLAIR Inject into the skin.   Synthroid 75 MCG tablet Generic drug: levothyroxine TAKE ONE TABLET ON AN EMPTY STOMACH WITHA GLASS OF WATER AT LEAST 30 TO 60 MINUTES BEFORE BREAKFAST PT NEEDS APPOINTMENT   temazepam 30 MG capsule Commonly known as: RESTORIL TAKE ONE CAPSULE AT BEDTIME IF NEEDED FOR SLEEP   topiramate 25 MG tablet Commonly known as: TOPAMAX TAKE 1 TABLET BY MOUTH 2 TIMES DAILY.   Vitamin  B12 3000 MCG/ML Liqd Place under the tongue.   Vitamin D3 50 MCG (2000 UT) capsule Take by mouth.        Allergies:  Allergies  Allergen Reactions   Sulfa Antibiotics Shortness Of Breath   Hydrocodone Itching   Codeine     itch   Doxycycline Hyclate     rash   Sulfamethoxazole-Trimethoprim Hives    Family History: Family History  Problem Relation Age of Onset   Dementia Mother    Hypertension Mother    Diabetes Mother    Cancer Father        lung   Cancer Sister        bladder    Social History:  reports that she has never smoked. She has never used smokeless tobacco. She reports that she does not drink alcohol and does not use drugs.   Physical Exam: BP 136/70   Pulse 89   Ht '5\' 6"'$  (1.676 m)   Wt 177 lb (80.3 kg)   BMI 28.57 kg/m   Constitutional:  Alert and oriented, No acute distress. HEENT: [place] AT, moist mucus membranes.  Trachea midline, no masses. Cardiovascular: No clubbing, cyanosis, or edema. Respiratory: Normal respiratory effort, no increased work of breathing.  Laboratory Data:  Urinalysis Pending  Assessment & Plan:   65 y.o. female with 1 UTI with positive culture August 2022.  Recurrent symptoms earlier this week.  Urine culture negative though symptoms have improved with antibiotic We discussed that lower tract UTIs are common in women due to changes in vaginal flora after menopause and local factors of the bladder mucosa We discussed potential supplements that may prevent urinary tract infections including cranberry and D-mannose She will return here instead of urgent care for recurrent symptoms We discussed possibility of low-dose antibiotic prophylaxis versus postcoital prophylaxis   Abbie Sons, MD  [facility] [address], [place], [place] [address] [phone]

## 2021-03-18 NOTE — Patient Instructions (Signed)
Take over the counter D-Mannose and Cranberry tablets

## 2021-03-23 NOTE — Telephone Encounter (Signed)
Patient is calling in to see if she can be set up with [facility] by the end of the week.She would like to stop taking the metformin this weekend,please advise.

## 2021-03-26 ENCOUNTER — Other Ambulatory Visit: Payer: Self-pay | Admitting: Internal Medicine

## 2021-03-26 MED ORDER — TIRZEPATIDE 2.5 MG/0.5ML ~~LOC~~ SOAJ: 2.5000 mg | SUBCUTANEOUS | 2 refills | Status: DC

## 2021-03-26 NOTE — Progress Notes (Signed)
Mounjaro sent to pharmacy

## 2021-03-31 ENCOUNTER — Telehealth: Payer: Self-pay | Admitting: Pharmacist

## 2021-03-31 ENCOUNTER — Ambulatory Visit: Payer: No Typology Code available for payment source | Admitting: Internal Medicine

## 2021-03-31 ENCOUNTER — Other Ambulatory Visit: Payer: Self-pay

## 2021-03-31 ENCOUNTER — Encounter: Payer: Self-pay | Admitting: Internal Medicine

## 2021-03-31 VITALS — BP 122/78 | HR 64 | Temp 96.1°F | Ht 66.0 in | Wt 180.6 lb

## 2021-03-31 DIAGNOSIS — E890 Postprocedural hypothyroidism: Secondary | ICD-10-CM | POA: Diagnosis not present

## 2021-03-31 DIAGNOSIS — N3090 Cystitis, unspecified without hematuria: Secondary | ICD-10-CM | POA: Diagnosis not present

## 2021-03-31 DIAGNOSIS — E663 Overweight: Secondary | ICD-10-CM | POA: Diagnosis not present

## 2021-03-31 DIAGNOSIS — R7303 Prediabetes: Secondary | ICD-10-CM | POA: Diagnosis not present

## 2021-03-31 LAB — COMPREHENSIVE METABOLIC PANEL
ALT: 16 U/L (ref 0–35)
AST: 17 U/L (ref 0–37)
Albumin: 4.4 g/dL (ref 3.5–5.2)
Alkaline Phosphatase: 76 U/L (ref 39–117)
BUN: 20 mg/dL (ref 6–23)
CO2: 28 mEq/L (ref 19–32)
Calcium: 9 mg/dL (ref 8.4–10.5)
Chloride: 102 mEq/L (ref 96–112)
Creatinine, Ser: 0.92 mg/dL (ref 0.40–1.20)
GFR: 66.04 mL/min (ref >60.00)
Glucose, Bld: 96 mg/dL (ref 70–99)
Potassium: 5 mEq/L (ref 3.5–5.1)
Sodium: 137 mEq/L (ref 135–145)
Total Bilirubin: 0.3 mg/dL (ref 0.2–1.2)
Total Protein: 6.9 g/dL (ref 6.0–8.3)

## 2021-03-31 LAB — HEMOGLOBIN A1C: Hgb A1c MFr Bld: 5.7 % (ref 4.6–6.5)

## 2021-03-31 LAB — LIPID PANEL
Cholesterol: 215 mg/dL — ABNORMAL HIGH (ref 0–200)
HDL: 57.8 mg/dL (ref >39.00)
LDL Cholesterol: 131 mg/dL — ABNORMAL HIGH (ref 0–99)
NonHDL: 156.94
Total CHOL/HDL Ratio: 4
Triglycerides: 132 mg/dL (ref 0.0–149.0)
VLDL: 26.4 mg/dL (ref 0.0–40.0)

## 2021-03-31 LAB — TSH: TSH: 4.04 u[IU]/mL (ref 0.35–5.50)

## 2021-03-31 NOTE — Telephone Encounter (Signed)
Called patient. Left voicemail asking her to return my call to discuss Mounjaro. Sending MyChart message.

## 2021-03-31 NOTE — Patient Instructions (Signed)
Don't refill mounjaro until you hear from [place]  [facility]  [facility]  [place]  of all no no'S

## 2021-03-31 NOTE — Progress Notes (Signed)
Subjective:  Patient ID: Emily Arellano, female    DOB: September 04, 1955  Age: 65 y.o. 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 Diagnoses of Prediabetes, Overweight (BMI 25.0-29.9), and Cystitis were also pertinent to this visit.  HPI Emily Arellano presents for follow up on multiple issues.  Chief Complaint  Patient presents with   Follow-up    3 month follow up on weight management.     1)  UTI, recurrent. Patient was treated by Urgent Care on august 15 for ten day history of symptoms concerning for UTI (dysuria and low back pain ) after partially self treating with amoxicillin .  She was empirically prescribed ciprofloxacin 500 mg bid x 7 days and culture grew 80K citrobacter koseri , pansensitive. She finished the abx and returned on Sept 4 with return symptoms starting on Sept 3.   She was again prescribed cipro  based on UA positive for nitrites only, but sept 4  culture was negative for significant bacterial growth and she advised to stop the cipro.  Due to persistent symptoms she saw urology on Sept 8.  UA micro noted 6-10 WBC;s, no bacteria.   Advised to start D mannose and cranberry tablets and consider low dose abx for post coital prophylactic use.    2) Overweight/prediabetes:  she has been very motivated to lose weight but has had trouble controlling her binge eating . She has paid out of pocket for first month of  Mounjaro and has had the first dose without significant positive or negative effect/    Outpatient Medications Prior to Visit  Medication Sig Dispense Refill   albuterol (VENTOLIN HFA) 108 (90 Base) MCG/ACT inhaler Inhale into the lungs.     Cholecalciferol (VITAMIN D3) 2000 UNITS capsule Take by mouth.     Cyanocobalamin (VITAMIN B12) 3000 MCG/ML LIQD Place under the tongue.     cyclobenzaprine (FLEXERIL) 10 MG tablet Take 1 tablet (10 mg total) by mouth 3 (three) times daily. 270 tablet 1   ELIDEL 1 % cream APPLY TO THE  AFFECTED AREA ON THE SKIN EVERY DAY AS DIRECTED  3   escitalopram (LEXAPRO) 10 MG tablet Take 1 tablet (10 mg total) by mouth daily. 30 tablet 2   fluticasone (FLONASE) 50 MCG/ACT nasal spray Place into the nose.     MAGNESIUM PO Take 3-4 tablets by mouth at bedtime.     SYNTHROID 75 MCG tablet TAKE ONE TABLET ON AN EMPTY STOMACH WITHA GLASS OF WATER AT LEAST 30 TO 60 MINUTES BEFORE BREAKFAST PT NEEDS APPOINTMENT 30 tablet 1   temazepam (RESTORIL) 30 MG capsule TAKE ONE CAPSULE AT BEDTIME IF NEEDED FOR SLEEP 30 capsule 3   tirzepatide (MOUNJARO) 2.5 MG/0.5ML Pen Inject 2.5 mg into the skin once a week. 2 mL 2   topiramate (TOPAMAX) 25 MG tablet TAKE 1 TABLET BY MOUTH 2 TIMES DAILY. 60 tablet 2   meloxicam (MOBIC) 15 MG tablet TAKE 1 TABLET BY MOUTH DAILY WITH A MEAL (Patient not taking: Reported on 03/31/2021) 30 tablet 1   omalizumab (XOLAIR) 150 MG/ML prefilled syringe Inject into the skin. (Patient not taking: Reported on 03/31/2021)     No facility-administered medications prior to visit.    Review of Systems;  Patient denies headache, fevers, malaise, unintentional weight loss, skin rash, eye pain, sinus congestion and sinus pain, sore throat, dysphagia,  hemoptysis , cough, dyspnea, wheezing, chest pain, palpitations, orthopnea, edema, abdominal pain, nausea, melena, diarrhea, constipation, flank pain,  dysuria, hematuria, urinary  Frequency, nocturia, numbness, tingling, seizures,  Focal weakness, Loss of consciousness,  Tremor, insomnia, depression, anxiety, and suicidal ideation.      Objective:  BP 122/78 (BP Location: Left Arm, Patient Position: Sitting, Cuff Size: Large)   Pulse 64   Temp (!) 96.1 F (35.6 C) (Temporal)   Ht 5\' 6"  (1.676 m)   Wt 180 lb 9.6 oz (81.9 kg)   SpO2 97%   BMI 29.15 kg/m   BP Readings from Last 3 Encounters:  03/31/21 122/78  03/18/21 136/70  03/14/21 140/79    Wt Readings from Last 3 Encounters:  03/31/21 180 lb 9.6 oz (81.9 kg)  03/18/21 177  lb (80.3 kg)  12/24/20 179 lb (81.2 kg)    General appearance: alert, cooperative and appears stated age Ears: normal TM's and external ear canals both ears Throat: lips, mucosa, and tongue normal; teeth and gums normal Neck: no adenopathy, no carotid bruit, supple, symmetrical, trachea midline and thyroid not enlarged, symmetric, no tenderness/mass/nodules Back: symmetric, no curvature. ROM normal. No CVA tenderness. Lungs: clear to auscultation bilaterally Heart: regular rate and rhythm, S1, S2 normal, no murmur, click, rub or gallop Abdomen: soft, non-tender; bowel sounds normal; no masses,  no organomegaly Pulses: 2+ and symmetric Skin: Skin color, texture, turgor normal. No rashes or lesions Lymph nodes: Cervical, supraclavicular, and axillary nodes normal.  Lab Results  Component Value Date   HGBA1C 5.7 03/31/2021   HGBA1C 6.1 10/22/2020   HGBA1C 5.9 09/14/2018    Lab Results  Component Value Date   CREATININE 0.92 03/31/2021   CREATININE 0.93 10/22/2020   CREATININE 0.79 08/05/2020    Lab Results  Component Value Date   WBC 4.0 09/14/2018   HGB 13.1 09/14/2018   HCT 39.3 09/14/2018   PLT 331.0 09/14/2018   GLUCOSE 96 03/31/2021   CHOL 215 (H) 03/31/2021   TRIG 132.0 03/31/2021   HDL 57.80 03/31/2021   LDLCALC 131 (H) 03/31/2021   ALT 16 03/31/2021   AST 17 03/31/2021   NA 137 03/31/2021   K 5.0 03/31/2021   CL 102 03/31/2021   CREATININE 0.92 03/31/2021   BUN 20 03/31/2021   CO2 28 03/31/2021   TSH 4.04 03/31/2021   HGBA1C 5.7 03/31/2021    No results found.  Assessment & Plan:   Problem List Items Addressed This Visit       Unprioritized   Cystitis    All recent labs and evaluations reviewed in detail.  Symptoms recurring without evidence of infection ,  Only inflammation, with low back pain not indicative of pyelonephritis, only MSK .  Post coital prophylaxis will be prescribed if symptoms recur without evidence of infection.  Continue d mannose  and cranberry tablets per [facility])      Overweight (BMI 25.0-29.9)    She  Remains very motivated to lose weight but has trouble controlling her binge eating.  She recently started mounjaro but cannot continue beyond month 1 due to out of pocket cost of $900/month. CCM referral made for medication assistance.  counselling given.        Postsurgical hypothyroidism - Primary   Relevant Orders   TSH (Completed)   Prediabetes    A1c is improving with increased activity and dietary restrictions .  She tolerated Ozempic and appreciated a 12 lb weight loss during the last 2-3 months but the OOP cost is $1300/month.  She has been taking metformin but stopped it due to perceived deleterious effects of bladder  health (recurrent dysuria) .  She  recently started mounjaro but cannot continue beyond month 1 due to out of pocket cost of $900/month. CCM referral made for medication assistance.  counselling given.   Lab Results  Component Value Date   HGBA1C 5.7 03/31/2021         Relevant Orders   Comprehensive metabolic panel (Completed)   Lipid panel (Completed)   Hemoglobin A1c (Completed)    I spent 30 minutes dedicated to the care of this patient on the date of this encounter to include pre-visit review of his medical history,  Face-to-face time with the patient , and post visit ordering of testing and therapeutics.   There are no discontinued medications.  Follow-up: No follow-ups on file.   Crecencio Mc, MD

## 2021-04-02 ENCOUNTER — Other Ambulatory Visit: Payer: Self-pay | Admitting: Internal Medicine

## 2021-04-02 DIAGNOSIS — E663 Overweight: Secondary | ICD-10-CM

## 2021-04-02 DIAGNOSIS — R7303 Prediabetes: Secondary | ICD-10-CM

## 2021-04-02 NOTE — Telephone Encounter (Signed)
Done.  Thanks for your help!  TT

## 2021-04-02 NOTE — Progress Notes (Signed)
[id number]

## 2021-04-03 DIAGNOSIS — N3090 Cystitis, unspecified without hematuria: Secondary | ICD-10-CM | POA: Insufficient documentation

## 2021-04-03 NOTE — Assessment & Plan Note (Addendum)
A1c is improving with increased activity and dietary restrictions .  She tolerated Ozempic and appreciated a 12 lb weight loss during the last 2-3 months but the OOP cost is $1300/month.  She has been taking metformin but stopped it due to perceived deleterious effects of bladder health (recurrent dysuria) .  She  recently started mounjaro but cannot continue beyond month 1 due to out of pocket cost of $900/month. CCM referral made for medication assistance.  counselling given.   Lab Results  Component Value Date   HGBA1C 5.7 03/31/2021

## 2021-04-03 NOTE — Assessment & Plan Note (Signed)
All recent labs and evaluations reviewed in detail.  Symptoms recurring without evidence of infection ,  Only inflammation, with low back pain not indicative of pyelonephritis, only MSK .  Post coital prophylaxis will be prescribed if symptoms recur without evidence of infection.  Continue d mannose and cranberry tablets per [facility])

## 2021-04-03 NOTE — Assessment & Plan Note (Signed)
She  Remains very motivated to lose weight but has trouble controlling her binge eating.  She recently started mounjaro but cannot continue beyond month 1 due to out of pocket cost of $900/month. CCM referral made for medication assistance.  counselling given.

## 2021-04-07 ENCOUNTER — Ambulatory Visit: Payer: No Typology Code available for payment source | Admitting: Pharmacist

## 2021-04-07 DIAGNOSIS — E7800 Pure hypercholesterolemia, unspecified: Secondary | ICD-10-CM

## 2021-04-07 DIAGNOSIS — E663 Overweight: Secondary | ICD-10-CM

## 2021-04-07 DIAGNOSIS — R7303 Prediabetes: Secondary | ICD-10-CM

## 2021-04-07 MED ORDER — METFORMIN HCL ER 500 MG PO TB24: 500.0000 mg | ORAL_TABLET | Freq: Two times a day (BID) | ORAL | 2 refills | Status: DC

## 2021-04-07 MED ORDER — TIRZEPATIDE 5 MG/0.5ML ~~LOC~~ SOAJ: 5.0000 mg | SUBCUTANEOUS | 2 refills | Status: DC

## 2021-04-07 NOTE — Patient Instructions (Signed)
Emily Arellano,   It was great talking to you today!  Let's try the extended release metformin. Try 1 tablet once daily with food for at least a week. If you are not having diarrhea at that point, you can increase to 1 tablet twice daily. It can be common to have some diarrhea when starting metformin, but it should resolve within a week.   You can continue the Mounjaro 2.5 mg weekly dose. If you would like to continue this medication, I recommend that we increase to 5 mg weekly. This prescription was sent to your pharmacy.   Let me know if you have any questions or concerns.   Catie Darnelle Maffucci, PharmD   Visit Information   Goals Addressed               This Visit's Progress     Patient Stated     Medication Monitoring (pt-stated)        Patient Goals/Self-Care Activities Over the next 90 days, patient will:  - take medications as prescribed target a minimum of 150 minutes of moderate intensity exercise weekly engage in dietary modifications by moderating night time snacking         Patient verbalizes understanding of instructions provided today and agrees to view in [place].   Plan: Telephone follow up appointment with care management team member scheduled for:  ~6 weeks   Catie Darnelle Maffucci, PharmD, [place], [place] Clinical Pharmacist [employer] [facility] [employer] [phone]

## 2021-04-07 NOTE — Chronic Care Management (AMB) (Signed)
Care Management   Pharmacy Note  04/07/2021 Name: Emily Arellano MRN: [id number] DOB: 02-02-1956  Subjective: Emily Arellano is a 65 y.o. year old female who is a primary care patient of Crecencio Mc, MD. The Care Management team was consulted for assistance with care management and care coordination needs.    Engaged with patient by telephone for initial visit in response to provider referral for pharmacy case management and/or care coordination services.   The patient was given information about Care Management services today including:  Care Management services includes personalized support from designated clinical staff supervised by the patient's primary care provider, including individualized plan of care and coordination with other care providers. 24/7 contact phone numbers for assistance for urgent and routine care needs. The patient may stop case management services at any time by phone call to the office staff.  Patient agreed to services and consent obtained.  Assessment:  Review of patient status, including review of consultants reports, laboratory and other test data, was performed as part of comprehensive evaluation and provision of chronic care management services.   SDOH (Social Determinants of Health) assessments and interventions performed:  SDOH Interventions    Flowsheet Row Most Recent Value  SDOH Interventions   Financial Strain Interventions Other (Comment)  [no options for affordability at this time]        Objective:  Lab Results  Component Value Date   CREATININE 0.92 03/31/2021   CREATININE 0.93 10/22/2020   CREATININE 0.79 08/05/2020    Lab Results  Component Value Date   HGBA1C 5.7 03/31/2021       Component Value Date/Time   CHOL 215 (H) 03/31/2021 0851   CHOL 197 03/24/2017 0824   TRIG 132.0 03/31/2021 0851   HDL 57.80 03/31/2021 0851   HDL 61 03/24/2017 0824   CHOLHDL 4 03/31/2021 0851   VLDL 26.4 03/31/2021 0851   LDLCALC 131  (H) 03/31/2021 0851   LDLCALC 109 (H) 03/24/2017 0824    BP Readings from Last 3 Encounters:  03/31/21 122/78  03/18/21 136/70  03/14/21 140/79    Care Plan  Allergies  Allergen Reactions   Sulfa Antibiotics Shortness Of Breath   Hydrocodone Itching   Codeine     itch   Doxycycline Hyclate     rash   Sulfamethoxazole-Trimethoprim Hives    Medications Reviewed Today     Reviewed by De Hollingshead, RPH-CPP (Pharmacist) on 04/07/21 at 1235  Med List Status: <None>   Medication Order Taking? Sig Documenting Provider Last Dose Status Informant  albuterol (VENTOLIN HFA) 108 (90 Base) MCG/ACT inhaler [id number]  Inhale into the lungs. [provider]  Active   Cholecalciferol (VITAMIN D3) 2000 UNITS capsule [id number]  Take by mouth. [provider]  Active            Med Note Luvenia Heller   Tue Jan 27, 2015  9:54 AM) Received from: [place] (VITAMIN B12) 3000 MCG/ML LIQD [id number]  Place under the tongue. [provider]  Active            Med Note Luvenia Heller   Tue Jan 27, 2015  9:54 AM) Received from: [facility]  cyclobenzaprine (FLEXERIL) 10 MG tablet [id number]  Take 1 tablet (10 mg total) by mouth 3 (three) times daily. Crecencio Mc, MD  Active   ELIDEL 1 % cream [id number]  APPLY TO THE AFFECTED AREA ON THE SKIN EVERY DAY AS DIRECTED [provider]  Active   escitalopram (LEXAPRO) 10 MG tablet [id number]  Take 1 tablet (10 mg total) by mouth daily. Crecencio Mc, MD  Active   fluticasone Asencion Islam) 50 MCG/ACT nasal spray [id number]  Place into the nose. [provider]  Active            Med Note Luvenia Heller   Tue Jan 27, 2015  9:54 AM) Received from: [place] [id number]  Take 3-4 tablets by mouth at bedtime. [provider]  Active Self  meloxicam (MOBIC) 15 MG tablet [id number]  TAKE 1 TABLET BY MOUTH DAILY WITH A  MEAL  Patient not taking: Reported on 03/31/2021   Crecencio Mc, MD  Active   omalizumab Arvid Right) 150 MG/ML prefilled syringe [id number]  Inject into the skin.  Patient not taking: Reported on 03/31/2021   [provider]  Active   SYNTHROID 75 MCG tablet [id number]  TAKE ONE TABLET ON AN EMPTY STOMACH WITHA GLASS OF WATER AT LEAST 30 TO 60 MINUTES BEFORE BREAKFAST PT NEEDS APPOINTMENT Crecencio Mc, MD  Active   temazepam (RESTORIL) 30 MG capsule [id number]  TAKE ONE CAPSULE AT BEDTIME IF NEEDED FOR SLEEP Crecencio Mc, MD  Active   tirzepatide [facility]) 2.5 MG/0.5ML Pen [id number] Yes Inject 2.5 mg into the skin once a week. Crecencio Mc, MD Taking Active   tirzepatide [facility]) 5 MG/0.5ML Pen [id number] Yes Inject 5 mg into the skin once a week. Crecencio Mc, MD  Active   topiramate (TOPAMAX) 25 MG tablet [id number]  TAKE 1 TABLET BY MOUTH 2 TIMES DAILY. Crecencio Mc, MD  Active             Patient Active Problem List   Diagnosis Date Noted   Cystitis 04/03/2021   Bladder prolapse, female, acquired 12/03/2018   GERD (gastroesophageal reflux disease) 01/25/2018   Bursitis of left hip 03/19/2017   Encounter for preventive health examination 10/16/2016   Cervicalgia 05/01/2016   Overweight (BMI 25.0-29.9) 12/24/2015   Insomnia 10/01/2015   Arthralgia 10/01/2015   Influenza vaccination declined 10/01/2015   Allergic rhinitis 01/27/2015   Family history of drug addiction 01/27/2015   Anxiety, generalized 01/27/2015   History of prolonged Q-T interval on ECG 01/27/2015   H/O malignant neoplasm of thyroid 01/27/2015   HLD (hyperlipidemia) 01/27/2015   Arthritis, degenerative 01/27/2015   Osteopenia 01/27/2015   Postsurgical hypothyroidism 01/27/2015   Post menopausal syndrome 01/27/2015   Prediabetes 01/27/2015   TAH/BSO, HX OF 04/24/2007   PARATHYROIDECTOMY 04/24/2007    Conditions to be addressed/monitored:  Overweight, prediabetes  Care Plan :  Medication Management  Updates made by De Hollingshead, RPH-CPP since 04/07/2021 12:00 AM     Problem: Overweight, Prediabetes      Long-Range Goal: Disease Progression Prevention   Start Date: 04/07/2021  This Visit's Progress: On track  Priority: High  Note:   Current Barriers:  Unable to achieve control of weight   Pharmacist Clinical Goal(s):  Over the next 90 days, patient will achieve improvement in weight  through collaboration with PharmD and provider.   Interventions: 1:1 collaboration with Crecencio Mc, MD regarding development and update of comprehensive plan of care as evidenced by provider attestation and co-signature Inter-disciplinary care team collaboration (see longitudinal plan of care) Comprehensive medication review performed; medication list updated in electronic medical record  Overweight/Obesity Complicated by Prediabetes, hx Thyroidectomy (follicular thyroid cancer) Unable to achieve goal weight loss through  lifestyle modification alone; current treatment: Mounjaro 2.5 mg weekly x 2 weeks;  Medications/Strategies previously tried: Metformin IR - diarrhea Ozempic/Wegovy- benefit up to 1 mg dose, but unable to continue due to access and cost Saxenda - unable to continue due to cost Topiramate, Qsymia - lack of benefit Phetermine, Phendimetrazine - beneficial, but only able to use short term and inappropriate choices now given hx QT prolongation and potentially also inappropriate given family history of drug abuse Baseline weight: 180 lbs;  Current meal patterns: reports she does generally well throughout the day, but some self-described "binge eating" in the evenings and emotional eating (celebratory).  Current exercise: limited by history of traumatic ankle injury Extensive dietary counseling including education on focus on lean proteins, fruits and vegetables, whole grains and increased fiber consumption, adequate hydration Extensive exercise counseling  including eventual goal of 150 minutes of moderate intensity exercise weekly Motivational interviewing provided regarding choosing other celebratory activities rather than food.  Discussed referral to Weight Management clinic with Dr. Leafy Ro. Discussed enhanced focus on lifestyle modifications, dietary recommendations, and psych support from this clinic. She will contemplate future referral. Discussed recommendation to avoid stimulants due to medical and family history, and inability to safely use long term. Discussed preferred use of GLP1 given prediabetes and goal of preventing progression to diabetes. Discussed Rybelsus, though patient reviewed that cost would be the same and we do not anticipate greater A1c or weight loss benefit vs prior doses of Ozempic.  Discussed trial of ER metformin for prediabetes, potential impact on weight. Discussed reduced incidence of GI adverse events with ER metformin. Patient amenable. Counseled to start with XR 500 mg once daily with food for at least a week to establish tolerability, then increase to twice daily dosing.  Patient would like to try another month of Mounjaro and would like to continue to pay out of pocket. Patient cannot access manufacturer savings program as her insurance is a "discount program" rather than full insurance (she and her husband are self employed and cost of at true insurance plan through the Marketplace was too expensive). She is over income for [employer] assistance for uninsured patients for [employer]. Discussed increasing Mounjaro to 5 mg weekly to see if greater benefit at that dose. Patient amenable. Script sent for her to increase to 5 mg once 2.5 mg dose is completed.    Patient Goals/Self-Care Activities Over the next 90 days, patient will:  - take medications as prescribed target a minimum of 150 minutes of moderate intensity exercise weekly engage in dietary modifications by moderating night time snacking  Follow Up Plan:  Telephone follow up appointment with care management team member scheduled for: ~6 weeks      Medication Assistance:  None available  Follow Up:  Patient agrees to Care Plan and Follow-up.  Plan: Telephone follow up appointment with care management team member scheduled for:  ~6 weeks  Catie Darnelle Maffucci, PharmD, [place], [place] Clinical Pharmacist [employer] at [employer] [phone]

## 2021-04-23 ENCOUNTER — Other Ambulatory Visit: Payer: Self-pay | Admitting: Internal Medicine

## 2021-05-20 ENCOUNTER — Other Ambulatory Visit: Payer: Self-pay | Admitting: Internal Medicine

## 2021-05-27 ENCOUNTER — Telehealth: Payer: No Typology Code available for payment source

## 2021-06-24 ENCOUNTER — Other Ambulatory Visit: Payer: Self-pay | Admitting: Internal Medicine

## 2021-07-16 ENCOUNTER — Other Ambulatory Visit: Payer: Self-pay | Admitting: Internal Medicine

## 2021-07-16 DIAGNOSIS — R7303 Prediabetes: Secondary | ICD-10-CM

## 2021-07-26 ENCOUNTER — Other Ambulatory Visit: Payer: Self-pay | Admitting: Internal Medicine

## 2021-07-27 ENCOUNTER — Other Ambulatory Visit: Payer: Self-pay | Admitting: Adult Health

## 2021-07-27 MED ORDER — TEMAZEPAM 30 MG PO CAPS: 30.0000 mg | ORAL_CAPSULE | Freq: Every evening | ORAL | 0 refills | Status: DC | PRN

## 2021-07-27 NOTE — Telephone Encounter (Signed)
Pt called wanting an update on medication refill and she is completely out and she need it tonight

## 2021-07-27 NOTE — Progress Notes (Signed)
No orders of the defined types were placed in this encounter.   Meds ordered this encounter  Medications   temazepam (RESTORIL) 30 MG capsule    Sig: Take 1 capsule (30 mg total) by mouth at bedtime as needed for sleep. Schedule follow up with PCP.    Dispense:  30 capsule    Refill:  0

## 2021-07-27 NOTE — Telephone Encounter (Signed)
Refilled: 02/25/2021 Last OV: 03/31/2021 Next OV: not scheduled

## 2021-07-27 NOTE — Telephone Encounter (Signed)
Patient is almost out of her temazepam (RESTORIL) 30 MG capsule and needs a refill.

## 2021-07-28 NOTE — Telephone Encounter (Signed)
Pt has been scheduled for 09/24/2021.

## 2021-08-17 ENCOUNTER — Other Ambulatory Visit: Payer: Self-pay | Admitting: Internal Medicine

## 2021-08-17 DIAGNOSIS — Z1231 Encounter for screening mammogram for malignant neoplasm of breast: Secondary | ICD-10-CM

## 2021-08-24 ENCOUNTER — Other Ambulatory Visit: Payer: Self-pay | Admitting: Internal Medicine

## 2021-08-24 ENCOUNTER — Other Ambulatory Visit: Payer: Self-pay | Admitting: Adult Health

## 2021-08-27 ENCOUNTER — Encounter: Payer: Self-pay | Admitting: Internal Medicine

## 2021-08-27 NOTE — Telephone Encounter (Signed)
Pt need refill on [facility]. Pt is out of medication.

## 2021-09-23 ENCOUNTER — Ambulatory Visit
Admission: RE | Admit: 2021-09-23 | Discharge: 2021-09-23 | Disposition: A | Discharge status: Home / Self Care | Payer: No Typology Code available for payment source | Source: Ambulatory Visit | Attending: Internal Medicine | Admitting: Internal Medicine

## 2021-09-23 DIAGNOSIS — Z1231 Encounter for screening mammogram for malignant neoplasm of breast: Secondary | ICD-10-CM | POA: Insufficient documentation

## 2021-09-24 ENCOUNTER — Other Ambulatory Visit: Payer: Self-pay

## 2021-09-24 ENCOUNTER — Encounter: Payer: Self-pay | Admitting: Internal Medicine

## 2021-09-24 ENCOUNTER — Ambulatory Visit: Payer: No Typology Code available for payment source | Admitting: Internal Medicine

## 2021-09-24 ENCOUNTER — Telehealth: Payer: Self-pay | Admitting: Internal Medicine

## 2021-09-24 VITALS — BP 118/72 | HR 68 | Temp 97.8°F | Ht 67.0 in | Wt 181.7 lb

## 2021-09-24 DIAGNOSIS — E7800 Pure hypercholesterolemia, unspecified: Secondary | ICD-10-CM

## 2021-09-24 DIAGNOSIS — R7303 Prediabetes: Secondary | ICD-10-CM | POA: Diagnosis not present

## 2021-09-24 DIAGNOSIS — R5383 Other fatigue: Secondary | ICD-10-CM | POA: Diagnosis not present

## 2021-09-24 DIAGNOSIS — E890 Postprocedural hypothyroidism: Secondary | ICD-10-CM | POA: Diagnosis not present

## 2021-09-24 DIAGNOSIS — E663 Overweight: Secondary | ICD-10-CM

## 2021-09-24 LAB — CBC WITH DIFFERENTIAL/PLATELET
Basophils Absolute: 0 10*3/uL (ref 0.0–0.1)
Basophils Relative: 0.6 % (ref 0.0–3.0)
Eosinophils Absolute: 0.1 10*3/uL (ref 0.0–0.7)
Eosinophils Relative: 2.9 % (ref 0.0–5.0)
HCT: 40 % (ref 36.0–46.0)
Hemoglobin: 13.2 g/dL (ref 12.0–15.0)
Lymphocytes Relative: 22.5 % (ref 12.0–46.0)
Lymphs Abs: 1 10*3/uL (ref 0.7–4.0)
MCHC: 33 g/dL (ref 30.0–36.0)
MCV: 89.5 fl (ref 78.0–100.0)
Monocytes Absolute: 0.4 10*3/uL (ref 0.1–1.0)
Monocytes Relative: 10.2 % (ref 3.0–12.0)
Neutro Abs: 2.8 10*3/uL (ref 1.4–7.7)
Neutrophils Relative %: 63.8 % (ref 43.0–77.0)
Platelets: 318 10*3/uL (ref 150.0–400.0)
RBC: 4.47 Mil/uL (ref 3.87–5.11)
RDW: 14 % (ref 11.5–15.5)
WBC: 4.3 10*3/uL (ref 4.0–10.5)

## 2021-09-24 LAB — HEMOGLOBIN A1C: Hgb A1c MFr Bld: 5.9 % (ref 4.6–6.5)

## 2021-09-24 LAB — COMPREHENSIVE METABOLIC PANEL
ALT: 16 U/L (ref 0–35)
AST: 16 U/L (ref 0–37)
Albumin: 4.6 g/dL (ref 3.5–5.2)
Alkaline Phosphatase: 58 U/L (ref 39–117)
BUN: 22 mg/dL (ref 6–23)
CO2: 29 mEq/L (ref 19–32)
Calcium: 9.3 mg/dL (ref 8.4–10.5)
Chloride: 100 mEq/L (ref 96–112)
Creatinine, Ser: 0.89 mg/dL (ref 0.40–1.20)
GFR: 68.48 mL/min (ref >60.00)
Glucose, Bld: 106 mg/dL — ABNORMAL HIGH (ref 70–99)
Potassium: 4.3 mEq/L (ref 3.5–5.1)
Sodium: 137 mEq/L (ref 135–145)
Total Bilirubin: 0.4 mg/dL (ref 0.2–1.2)
Total Protein: 7.4 g/dL (ref 6.0–8.3)

## 2021-09-24 LAB — LIPID PANEL
Cholesterol: 204 mg/dL — ABNORMAL HIGH (ref 0–200)
HDL: 59.2 mg/dL (ref >39.00)
LDL Cholesterol: 121 mg/dL — ABNORMAL HIGH (ref 0–99)
NonHDL: 145.21
Total CHOL/HDL Ratio: 3
Triglycerides: 123 mg/dL (ref 0.0–149.0)
VLDL: 24.6 mg/dL (ref 0.0–40.0)

## 2021-09-24 LAB — TSH: TSH: 2.01 u[IU]/mL (ref 0.35–5.50)

## 2021-09-24 MED ORDER — FLUTICASONE PROPIONATE 50 MCG/ACT NA SUSP: 1.0000 | Freq: Every day | NASAL | 2 refills | Status: DC

## 2021-09-24 MED ORDER — TOPIRAMATE 25 MG PO TABS: 25.0000 mg | ORAL_TABLET | Freq: Two times a day (BID) | ORAL | 5 refills | Status: DC

## 2021-09-24 NOTE — Assessment & Plan Note (Signed)
Currently using the [place].  Resume topomax.  ?

## 2021-09-24 NOTE — Progress Notes (Signed)
? ?Subjective:  ?Patient ID: Emily Arellano, female    DOB: 06-03-1956  Age: 66 y.o. MRN: [id number] ? ?CC: The primary encounter diagnosis was Pure hypercholesterolemia. Diagnoses of Prediabetes, Postsurgical hypothyroidism, Other fatigue, and Overweight (BMI 25.0-29.9) were also pertinent to this visit. ? ? ?This visit occurred during the SARS-CoV-2 public health emergency.  Safety protocols were in place, including screening questions prior to the visit, additional usage of staff PPE, and extensive cleaning of exam room while observing appropriate contact time as indicated for disinfecting solutions.   ? ?HPI ?Emily Arellano presents for follow up on chronic conditions ?Chief Complaint  ?Patient presents with  ? discuss medication  ? ?1) overweight::  complicated by recent COVID infection with pneumonia, treated with ivermectin.   Gained weight  now following the [place].  No longer taking metformin or topomax but wants to resume one for appetite suppression.  Reviewed diet and exercise strategies.  ? ?2) GAD:  currently using only temazepam for insomnia.  Not taking lexapro . Work environment , financial trends all contributing  kids doing well including her granddaughter.  ? ? ?Outpatient Medications Prior to Visit  ?Medication Sig Dispense Refill  ? albuterol (VENTOLIN HFA) 108 (90 Base) MCG/ACT inhaler Inhale into the lungs.    ? Cholecalciferol (VITAMIN D3) 2000 UNITS capsule Take by mouth.    ? Cyanocobalamin (VITAMIN B12) 3000 MCG/ML LIQD Place under the tongue.    ? cyclobenzaprine (FLEXERIL) 10 MG tablet Take 1 tablet (10 mg total) by mouth 3 (three) times daily. 270 tablet 1  ? ELIDEL 1 % cream APPLY TO THE AFFECTED AREA ON THE SKIN EVERY DAY AS DIRECTED  3  ? MAGNESIUM PO Take 3-4 tablets by mouth at bedtime.    ? SYNTHROID 75 MCG tablet TAKE ONE TABLET ON AN EMPTY STOMACH WITHA GLASS OF WATER AT LEAST 30 TO 60 MINUTES BEFORE BREAKFAST 30 tablet 1  ? temazepam (RESTORIL) 30 MG capsule Take 1  capsule (30 mg total) by mouth at bedtime as needed for sleep. 30 capsule 5  ? fluticasone (FLONASE) 50 MCG/ACT nasal spray Place into the nose.    ? escitalopram (LEXAPRO) 10 MG tablet Take 1 tablet (10 mg total) by mouth daily. (Patient not taking: Reported on 09/24/2021) 30 tablet 2  ? meloxicam (MOBIC) 15 MG tablet TAKE 1 TABLET BY MOUTH DAILY WITH A MEAL (Patient not taking: Reported on 09/24/2021) 30 tablet 1  ? metFORMIN (GLUCOPHAGE-XR) 500 MG 24 hr tablet TAKE 1 TABLET BY MOUTH 2 TIMES DAILY. (Patient not taking: Reported on 09/24/2021) 60 tablet 2  ? omalizumab Arvid Right) 150 MG/ML prefilled syringe Inject into the skin. (Patient not taking: Reported on 09/24/2021)    ? tirzepatide [facility]) 2.5 MG/0.5ML Pen Inject 2.5 mg into the skin once a week. (Patient not taking: Reported on 09/24/2021) 2 mL 2  ? tirzepatide (MOUNJARO) 5 MG/0.5ML Pen Inject 5 mg into the skin once a week. (Patient not taking: Reported on 09/24/2021) 2 mL 2  ? topiramate (TOPAMAX) 25 MG tablet TAKE 1 TABLET BY MOUTH 2 TIMES DAILY. (Patient not taking: Reported on 09/24/2021) 60 tablet 2  ? ?No facility-administered medications prior to visit.  ? ? ?Review of Systems; ? ?Patient denies headache, fevers, malaise, unintentional weight loss, skin rash, eye pain, sinus congestion and sinus pain, sore throat, dysphagia,  hemoptysis , cough, dyspnea, wheezing, chest pain, palpitations, orthopnea, edema, abdominal pain, nausea, melena, diarrhea, constipation, flank pain, dysuria, hematuria, urinary  Frequency, nocturia, numbness,  tingling, seizures,  Focal weakness, Loss of consciousness,  Tremor, insomnia, depression, anxiety, and suicidal ideation.   ? ? ? ?Objective:  ?BP 118/72 (BP Location: Left Arm, Patient Position: Sitting, Cuff Size: Normal)   Pulse 68   Temp 97.8 ?F (36.6 ?C) (Oral)   Ht 5' 7"  (1.702 m)   Wt 181 lb 11.5 oz (82.4 kg)   SpO2 99%   BMI 28.46 kg/m?  ? ?BP Readings from Last 3 Encounters:  ?09/24/21 118/72  ?03/31/21 122/78   ?03/18/21 136/70  ? ? ?Wt Readings from Last 3 Encounters:  ?09/24/21 181 lb 11.5 oz (82.4 kg)  ?03/31/21 180 lb 9.6 oz (81.9 kg)  ?03/18/21 177 lb (80.3 kg)  ? ? ?General appearance: alert, cooperative and appears stated age ?Ears: normal TM's and external ear canals both ears ?Throat: lips, mucosa, and tongue normal; teeth and gums normal ?Neck: no adenopathy, no carotid bruit, supple, symmetrical, trachea midline and thyroid not enlarged, symmetric, no tenderness/mass/nodules ?Back: symmetric, no curvature. ROM normal. No CVA tenderness. ?Lungs: clear to auscultation bilaterally ?Heart: regular rate and rhythm, S1, S2 normal, no murmur, click, rub or gallop ?Abdomen: soft, non-tender; bowel sounds normal; no masses,  no organomegaly ?Pulses: 2+ and symmetric ?Skin: Skin color, texture, turgor normal. No rashes or lesions ?Lymph nodes: Cervical, supraclavicular, and axillary nodes normal. ? ?Lab Results  ?Component Value Date  ? HGBA1C 5.7 03/31/2021  ? HGBA1C 6.1 10/22/2020  ? HGBA1C 5.9 09/14/2018  ? ? ?Lab Results  ?Component Value Date  ? CREATININE 0.92 03/31/2021  ? CREATININE 0.93 10/22/2020  ? CREATININE 0.79 08/05/2020  ? ? ?Lab Results  ?Component Value Date  ? WBC 4.0 09/14/2018  ? HGB 13.1 09/14/2018  ? HCT 39.3 09/14/2018  ? PLT 331.0 09/14/2018  ? GLUCOSE 96 03/31/2021  ? CHOL 215 (H) 03/31/2021  ? TRIG 132.0 03/31/2021  ? HDL 57.80 03/31/2021  ? LDLCALC 131 (H) 03/31/2021  ? ALT 16 03/31/2021  ? AST 17 03/31/2021  ? NA 137 03/31/2021  ? K 5.0 03/31/2021  ? CL 102 03/31/2021  ? CREATININE 0.92 03/31/2021  ? BUN 20 03/31/2021  ? CO2 28 03/31/2021  ? TSH 4.04 03/31/2021  ? HGBA1C 5.7 03/31/2021  ? ? ?No results found. ? ?Assessment & Plan:  ? ?Problem List Items Addressed This Visit   ? ? HLD (hyperlipidemia) - Primary  ? Relevant Orders  ? Lipid Profile  ? Postsurgical hypothyroidism  ? Relevant Orders  ? TSH  ? Prediabetes  ? Relevant Orders  ? Hemoglobin A1c  ? Comp Met (CMET)  ? Overweight (BMI  25.0-29.9)  ?  Currently using the [place].  Resume topomax.  ?  ?  ? ?Other Visit Diagnoses   ? ? Other fatigue      ? Relevant Orders  ? CBC with Differential/Platelet  ? ?  ? ? ?I spent 20 minutes dedicated to the care of this patient on the date of this encounter to include pre-visit review of patient's medical history,  most recent imaging studies, Face-to-face time with the patient , and post visit ordering of testing and therapeutics.   ? ?Follow-up: No follow-ups on file. ? ? ?Crecencio Mc, MD ?

## 2021-09-24 NOTE — Telephone Encounter (Signed)
Pt will be transitioning over to [employer]... Pt appt is on 04/01/2022 at 8am ?

## 2021-09-24 NOTE — Patient Instructions (Addendum)
Home phone number [phone]  ? ?Next time you have a UTI call ME  ? ?I agree with resuming topiramate for appetite suppression ,  just once daily at night ? ? ? ?

## 2021-09-28 ENCOUNTER — Inpatient Hospital Stay
Admission: RE | Admit: 2021-09-28 | Discharge: 2021-09-28 | Disposition: A | Discharge status: Home / Self Care | Payer: Self-pay | Source: Ambulatory Visit | Attending: *Deleted | Admitting: *Deleted

## 2021-09-28 ENCOUNTER — Other Ambulatory Visit: Payer: Self-pay | Admitting: *Deleted

## 2021-09-28 DIAGNOSIS — Z1231 Encounter for screening mammogram for malignant neoplasm of breast: Secondary | ICD-10-CM

## 2021-10-01 ENCOUNTER — Other Ambulatory Visit: Payer: Self-pay | Admitting: Internal Medicine

## 2021-10-28 ENCOUNTER — Ambulatory Visit: Payer: Self-pay | Admitting: Dermatology

## 2021-11-19 ENCOUNTER — Other Ambulatory Visit: Payer: Self-pay | Admitting: Internal Medicine

## 2021-11-19 ENCOUNTER — Encounter: Payer: Self-pay | Admitting: Internal Medicine

## 2021-11-23 ENCOUNTER — Other Ambulatory Visit: Payer: Self-pay | Admitting: Internal Medicine

## 2021-11-23 NOTE — Telephone Encounter (Signed)
LMTCB to schedule an appointment with Dr. Derrel Nip.  ?

## 2021-11-24 NOTE — Telephone Encounter (Signed)
Appt was scheduled for 12/17/2021. ?

## 2021-12-16 ENCOUNTER — Telehealth: Payer: Self-pay

## 2021-12-16 NOTE — Telephone Encounter (Signed)
LMTCB for pre charting before appt on 12/17/2021.

## 2021-12-16 NOTE — Telephone Encounter (Signed)
Pt returned call

## 2021-12-17 ENCOUNTER — Encounter: Payer: Self-pay | Admitting: Internal Medicine

## 2021-12-17 ENCOUNTER — Ambulatory Visit: Payer: No Typology Code available for payment source | Admitting: Internal Medicine

## 2021-12-17 VITALS — BP 120/72 | HR 72 | Temp 97.8°F | Ht 66.0 in | Wt 177.0 lb

## 2021-12-17 DIAGNOSIS — Z0000 Encounter for general adult medical examination without abnormal findings: Secondary | ICD-10-CM

## 2021-12-17 DIAGNOSIS — Z124 Encounter for screening for malignant neoplasm of cervix: Secondary | ICD-10-CM

## 2021-12-17 DIAGNOSIS — E890 Postprocedural hypothyroidism: Secondary | ICD-10-CM

## 2021-12-17 DIAGNOSIS — R7303 Prediabetes: Secondary | ICD-10-CM | POA: Diagnosis not present

## 2021-12-17 DIAGNOSIS — E663 Overweight: Secondary | ICD-10-CM

## 2021-12-17 MED ORDER — ONDANSETRON HCL 4 MG PO TABS: 4.0000 mg | ORAL_TABLET | Freq: Three times a day (TID) | ORAL | 0 refills | Status: DC | PRN

## 2021-12-17 MED ORDER — SEMAGLUTIDE-WEIGHT MANAGEMENT 0.25 MG/0.5ML ~~LOC~~ SOAJ: 0.2500 mg | SUBCUTANEOUS | 0 refills | Status: AC

## 2021-12-17 MED ORDER — PIMECROLIMUS 1 % EX CREA
TOPICAL_CREAM | CUTANEOUS | 1 refills | Status: DC
Start: 2021-12-17 — End: 2022-09-02

## 2021-12-17 NOTE — Assessment & Plan Note (Signed)
She has trouble suppressing appetite and maintaining the weight loss she achieved after stopping the [place]  Despite following a careful diet nad exercising .  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 .  The risks  and benefits were reviewed, and instructions on titration of dose were outlined.  Marland Kitchen  She was given a sample pen of wegovy 0.25 mg dose and a sample pen of Ozempic 0.25/0.5 mg dose

## 2021-12-17 NOTE — Patient Instructions (Signed)
YOU ARE STARTING WITH THE  WEGOVY PENS:  The dose for the first 4 weeks  is  0.25 mg.  You may have mild nausea on the first or second day but this should resolve.  You can use  Zofran for nausea as needed  As long as you are losing weight,  you can continue the 0.25 mg dose  when you switch to the Ozempic pen.  If your weight plateaus,  you can  increase the dose to 0.5 mg   Let me know when you need a refill and what dose you are taking.

## 2021-12-17 NOTE — Progress Notes (Unsigned)
The patient is here for annual preventive examination and management of other chronic and acute problems.   The risk factors are reflected in the social history.   The roster of all physicians providing medical care to patient - is listed in the Snapshot section of the chart.   Activities of daily living:  The patient is 100% independent in all ADLs: dressing, toileting, feeding as well as independent mobility   Home safety : The patient has smoke detectors in the home. They wear seatbelts.  There are no unsecured firearms at home. There is no violence in the home.    There is no risks for hepatitis, STDs or HIV. There is no   history of blood transfusion. They have no travel history to infectious disease endemic areas of the world.   The patient has seen their dentist in the last six month. They have seen their eye doctor in the last year. The patinet  denies slight hearing difficulty with regard to whispered voices and some television programs.  They have deferred audiologic testing in the last year.  They do not  have excessive sun exposure. Discussed the need for sun protection: hats, long sleeves and use of sunscreen if there is significant sun exposure.    Diet: the importance of a healthy diet is discussed. They do have a healthy diet.   The benefits of regular aerobic exercise were discussed. The patient  exercises  3 to 5 days per week  for  60 minutes.    Depression screen: there are no signs or vegative symptoms of depression- irritability, change in appetite, anhedonia, sadness/tearfullness.   The following portions of the patient's history were reviewed and updated as appropriate: allergies, current medications, past family history, past medical history,  past surgical history, past social history  and problem list.   Visual acuity was not assessed per patient preference since the patient has regular follow up with an  ophthalmologist. Hearing and body mass index were assessed and  reviewed.    During the course of the visit the patient was educated and counseled about appropriate screening and preventive services including : fall prevention , diabetes screening, nutrition counseling, colorectal cancer screening, and recommended immunizations.    Chief Complaint:  Inability to lose and maintain and ideal  weight      Review of Symptoms  Patient denies headache, fevers, malaise, unintentional weight loss, skin rash, eye pain, sinus congestion and sinus pain, sore throat, dysphagia,  hemoptysis , cough, dyspnea, wheezing, chest pain, palpitations, orthopnea, edema, abdominal pain, nausea, melena, diarrhea, constipation, flank pain, dysuria, hematuria, urinary  Frequency, nocturia, numbness, tingling, seizures,  Focal weakness, Loss of consciousness,  Tremor, insomnia, depression, anxiety, and suicidal ide Physical Exam:  BP 120/72 (BP Location: Left Arm, Patient Position: Sitting, Cuff Size: Normal)   Pulse 72   Temp 97.8 F (36.6 C) (Oral)   Ht '5\' 6"'$  (1.676 m)   Wt 177 lb (80.3 kg)   SpO2 99%   BMI 28.57 kg/m    General appearance: alert, cooperative and appears stated age Ears: normal TM's and external ear canals both ears Throat: lips, mucosa, and tongue normal; teeth and gums normal Neck: no adenopathy, no carotid bruit, supple, symmetrical, trachea midline and thyroid not enlarged, symmetric, no tenderness/mass/nodules Back: symmetric, no curvature. ROM normal. No CVA tenderness. Lungs: clear to auscultation bilaterally Heart: regular rate and rhythm, S1, S2 normal, no murmur, click, rub or gallop Abdomen: soft, non-tender; bowel sounds normal; no masses,  no organomegaly Pulses: 2+ and symmetric Skin: Skin color, texture, turgor normal. No rashes or lesions Lymph nodes: Cervical, supraclavicular, and axillary nodes normal.   Assessment and Plan:  Overweight (BMI 25.0-29.9)  She has trouble suppressing appetite and maintaining the weight loss she  achieved after stopping the Annona  Despite following a careful diet nad exercising .  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 .  The risks  and benefits were reviewed, and instructions on titration of dose were outlined.  Marland Kitchen  She was given a sample pen of wegovy 0.25 mg dose and a sample pen of Ozempic 0.25/0.5 mg dose   Encounter for preventive health examination age appropriate education and counseling updated, referrals for preventative services and immunizations addressed, dietary and smoking counseling addressed, most recent labs reviewed.  I have personally reviewed and have noted:   1) the patient's medical and social history 2) The pt's use of alcohol, tobacco, and illicit drugs 3) The patient's current medications and supplements 4) Functional ability including ADL's, fall risk, home safety risk, hearing and visual impairment 5) Diet and physical activities 6) Evidence for depression or mood disorder 7) The patient's height, weight, and BMI have been recorded in the chart 8) I have ordered and reviewed a 12 lead EKG and find that there are no acute changes and patient is in sinus rhythm.     I have made referrals, and provided counseling and education based on review of the above   Updated Medication List Outpatient Encounter Medications as of 12/17/2021  Medication Sig   albuterol (VENTOLIN HFA) 108 (90 Base) MCG/ACT inhaler Inhale into the lungs.   Cholecalciferol (VITAMIN D3) 2000 UNITS capsule Take by mouth.   Cyanocobalamin (VITAMIN B12) 3000 MCG/ML LIQD Place under the tongue.   cyclobenzaprine (FLEXERIL) 10 MG tablet Take 1 tablet (10 mg total) by mouth 3 (three) times daily.   fluticasone (FLONASE) 50 MCG/ACT nasal spray Place 1 spray into both nostrils daily.   MAGNESIUM PO Take 3-4 tablets by mouth at bedtime.   ondansetron (ZOFRAN) 4 MG tablet Take 1 tablet (4 mg total) by mouth every 8 (eight)  hours as needed for nausea or vomiting.   OZEMPIC, 0.25 OR 0.5 MG/DOSE, 2 MG/1.5ML SOPN INJECT 0.'5MG'$  INTO THE SKIN ONCE A WEEK   Semaglutide-Weight Management 0.25 MG/0.5ML SOAJ Inject 0.25 mg into the skin once a week for 28 days.   SYNTHROID 75 MCG tablet TAKE ONE TABLET ON AN EMPTY STOMACH WITHA GLASS OF WATER AT LEAST 30 TO 60 MINUTES BEFORE BREAKFAST   temazepam (RESTORIL) 30 MG capsule Take 1 capsule (30 mg total) by mouth at bedtime as needed for sleep.   [DISCONTINUED] ELIDEL 1 % cream APPLY TO THE AFFECTED AREA ON THE SKIN EVERY DAY AS DIRECTED   [DISCONTINUED] topiramate (TOPAMAX) 25 MG tablet Take 1 tablet (25 mg total) by mouth 2 (two) times daily.   pimecrolimus (ELIDEL) 1 % cream APPLY TO THE AFFECTED AREA ON THE SKIN EVERY DAY AS DIRECTED   No facility-administered encounter medications on file as of 12/17/2021.

## 2021-12-19 NOTE — Assessment & Plan Note (Signed)

## 2022-01-20 ENCOUNTER — Other Ambulatory Visit: Payer: Self-pay | Admitting: Internal Medicine

## 2022-01-27 ENCOUNTER — Encounter: Payer: Self-pay | Admitting: Internal Medicine

## 2022-02-03 NOTE — Telephone Encounter (Signed)
Patient was calling back to follow up on sample availability.

## 2022-02-04 NOTE — Telephone Encounter (Signed)
Pt returning call and would like for the cma to leave the message on her voicemail

## 2022-02-04 NOTE — Telephone Encounter (Signed)
LMTCB

## 2022-02-04 NOTE — Telephone Encounter (Signed)
Spoke with pt to let her know that we have a sample. Also let her know that she will need to stay on the 0.5 mg dose for one month and then in September let us know if she is going to continue the 0.5 mg or increase to the 1 mg. Pt gave a verbal understanding.

## 2022-02-04 NOTE — Telephone Encounter (Signed)
Yes let her know that she will need to stay on the 0.5 mg dose  for the next month,  and I will need to know what dose to [facility] for September  (0.5 of 1 mg?)

## 2022-02-04 NOTE — Telephone Encounter (Signed)
Medication Samples have been provided to the patient.  Drug name: Ozempic       Strength: 0.5 mg        Qty: 1 box  LOT: [id number]  Exp.Date: 08/11/2023  Dosing instructions: Inject 0.5 mg into skin once weekly.  The patient has been instructed regarding the correct time, dose, and frequency of taking this medication, including desired effects and most common side effects.   Emily Arellano 2:27 PM 02/04/2022

## 2022-02-15 NOTE — Telephone Encounter (Signed)
Pt picked up medication.

## 2022-03-03 ENCOUNTER — Encounter: Payer: Self-pay | Admitting: Internal Medicine

## 2022-03-04 MED ORDER — SEMAGLUTIDE (1 MG/DOSE) 4 MG/3ML ~~LOC~~ SOPN: 1.0000 mg | PEN_INJECTOR | SUBCUTANEOUS | 2 refills | Status: DC

## 2022-03-16 ENCOUNTER — Ambulatory Visit (INDEPENDENT_AMBULATORY_CARE_PROVIDER_SITE_OTHER): Payer: Medicare Other | Admitting: Dermatology

## 2022-03-16 DIAGNOSIS — R58 Hemorrhage, not elsewhere classified: Secondary | ICD-10-CM | POA: Diagnosis not present

## 2022-03-16 NOTE — Patient Instructions (Signed)
Due to recent changes in healthcare laws, you may see results of your pathology and/or laboratory studies on MyChart before the doctors have had a chance to review them. We understand that in some cases there may be results that are confusing or concerning to you. Please understand that not all results are received at the same time and often the doctors may need to interpret multiple results in order to provide you with the best plan of care or course of treatment. Therefore, we ask that you please give us 2 business days to thoroughly review all your results before contacting the office for clarification. Should we see a critical lab result, you will be contacted sooner.   If You Need Anything After Your Visit  If you have any questions or concerns for your doctor, please call our main line at [phone] and press option 4 to reach your doctor's medical assistant. If no one answers, please leave a voicemail as directed and we will return your call as soon as possible. Messages left after 4 pm will be answered the following business day.   You may also send us a message via MyChart. We typically respond to MyChart messages within 1-2 business days.  For prescription refills, please ask your pharmacy to contact our office. Our fax number is [phone].  If you have an urgent issue when the clinic is closed that cannot wait until the next business day, you can page your doctor at the number below.    Please note that while we do our best to be available for urgent issues outside of office hours, we are not available 24/7.   If you have an urgent issue and are unable to reach us, you may choose to seek medical care at your doctor's office, retail clinic, urgent care center, or emergency room.  If you have a medical emergency, please immediately call 911 or go to the emergency department.  Pager Numbers  - Dr. Kowalski: [phone]  - Dr. Moye: [phone]  - Dr. Stewart:  [phone]  In the event of inclement weather, please call our main line at [phone] for an update on the status of any delays or closures.  Dermatology Medication Tips: Please keep the boxes that topical medications come in in order to help keep track of the instructions about where and how to use these. Pharmacies typically print the medication instructions only on the boxes and not directly on the medication tubes.   If your medication is too expensive, please contact our office at [phone] option 4 or send us a message through MyChart.   We are unable to tell what your co-pay for medications will be in advance as this is different depending on your insurance coverage. However, we may be able to find a substitute medication at lower cost or fill out paperwork to get insurance to cover a needed medication.   If a prior authorization is required to get your medication covered by your insurance company, please allow us 1-2 business days to complete this process.  Drug prices often vary depending on where the prescription is filled and some pharmacies may offer cheaper prices.  The website www.goodrx.com contains coupons for medications through different pharmacies. The prices here do not account for what the cost may be with help from insurance (it may be cheaper with your insurance), but the website can give you the price if you did not use any insurance.  - You can print the associated coupon and take it with   your prescription to the pharmacy.  - You may also stop by our office during regular business hours and pick up a GoodRx coupon card.  - If you need your prescription sent electronically to a different pharmacy, notify our office through [facility] MyChart or by phone at [phone] option 4.     Si Usted Necesita Algo Despus de Su Visita  Tambin puede enviarnos un mensaje a travs de MyChart. Por lo general respondemos a los mensajes de MyChart en el transcurso de 1 a 2  das hbiles.  Para renovar recetas, por favor pida a su farmacia que se ponga en contacto con nuestra oficina. Nuestro nmero de fax es el [phone].  Si tiene un asunto urgente cuando la clnica est cerrada y que no puede esperar hasta el siguiente da hbil, puede llamar/localizar a su doctor(a) al nmero que aparece a continuacin.   Por favor, tenga en cuenta que aunque hacemos todo lo posible para estar disponibles para asuntos urgentes fuera del horario de oficina, no estamos disponibles las 24 horas del da, los 7 das de la semana.   Si tiene un problema urgente y no puede comunicarse con nosotros, puede optar por buscar atencin mdica  en el consultorio de su doctor(a), en una clnica privada, en un centro de atencin urgente o en una sala de emergencias.  Si tiene una emergencia mdica, por favor llame inmediatamente al 911 o vaya a la sala de emergencias.  Nmeros de bper  - Dr. Kowalski: [phone]  - Dra. Moye: [phone]  - Dra. Stewart: [phone]  En caso de inclemencias del tiempo, por favor llame a nuestra lnea principal al [phone] para una actualizacin sobre el estado de cualquier retraso o cierre.  Consejos para la medicacin en dermatologa: Por favor, guarde las cajas en las que vienen los medicamentos de uso tpico para ayudarle a seguir las instrucciones sobre dnde y cmo usarlos. Las farmacias generalmente imprimen las instrucciones del medicamento slo en las cajas y no directamente en los tubos del medicamento.   Si su medicamento es muy caro, por favor, pngase en contacto con nuestra oficina llamando al [phone] y presione la opcin 4 o envenos un mensaje a travs de MyChart.   No podemos decirle cul ser su copago por los medicamentos por adelantado ya que esto es diferente dependiendo de la cobertura de su seguro. Sin embargo, es posible que podamos encontrar un medicamento sustituto a menor costo o llenar un formulario para que el  seguro cubra el medicamento que se considera necesario.   Si se requiere una autorizacin previa para que su compaa de seguros cubra su medicamento, por favor permtanos de 1 a 2 das hbiles para completar este proceso.  Los precios de los medicamentos varan con frecuencia dependiendo del lugar de dnde se surte la receta y alguna farmacias pueden ofrecer precios ms baratos.  El sitio web www.goodrx.com tiene cupones para medicamentos de diferentes farmacias. Los precios aqu no tienen en cuenta lo que podra costar con la ayuda del seguro (puede ser ms barato con su seguro), pero el sitio web puede darle el precio si no utiliz ningn seguro.  - Puede imprimir el cupn correspondiente y llevarlo con su receta a la farmacia.  - Tambin puede pasar por nuestra oficina durante el horario de atencin regular y recoger una tarjeta de cupones de GoodRx.  - Si necesita que su receta se enve electrnicamente a una farmacia diferente, informe a nuestra oficina a travs de MyChart de [facility]   o por telfono llamando al [phone] y presione la opcin 4.  

## 2022-03-16 NOTE — Progress Notes (Signed)
   Follow-Up Visit   Subjective  Emily Arellano is a 66 y.o. female who presents for the following: Check Spot (Patient here today for a spot she noticed yesterday afternoon at her left lower leg. She said it was not there yesterday am. No hx of skin cancer. ).  No pain, itching. Not feeling ill, no chills or fever.  The following portions of the chart were reviewed this encounter and updated as appropriate:   Tobacco  Allergies  Meds  Problems  Med Hx  Surg Hx  Fam Hx      Review of Systems:  No other skin or systemic complaints except as noted in HPI or Assessment and Plan.  Objective  Well appearing patient in no apparent distress; mood and affect are within normal limits.  A focused examination was performed including lower legs. Relevant physical exam findings are noted in the Assessment and Plan.  left pretibia Purpura    Assessment & Plan  Ecchymosis left pretibia  No suspicious features visible on dermoscopy today. Benign-appearing. Call if not resolved.      Return for as scheduled, TBSE.  Graciella Belton, RMA, am acting as scribe for Forest Gleason, MD .   Documentation: I have reviewed the above documentation for accuracy and completeness, and I agree with the above.  Forest Gleason, MD

## 2022-03-25 ENCOUNTER — Other Ambulatory Visit: Payer: Self-pay | Admitting: Internal Medicine

## 2022-03-27 ENCOUNTER — Encounter: Payer: Self-pay | Admitting: Dermatology

## 2022-03-30 ENCOUNTER — Ambulatory Visit: Payer: Self-pay | Admitting: Dermatology

## 2022-03-31 ENCOUNTER — Encounter: Payer: Self-pay | Admitting: Internal Medicine

## 2022-03-31 ENCOUNTER — Ambulatory Visit: Payer: Self-pay | Admitting: Dermatology

## 2022-04-01 ENCOUNTER — Ambulatory Visit (INDEPENDENT_AMBULATORY_CARE_PROVIDER_SITE_OTHER): Payer: Medicare Other | Admitting: Internal Medicine

## 2022-04-01 ENCOUNTER — Encounter: Payer: Self-pay | Admitting: Internal Medicine

## 2022-04-01 ENCOUNTER — Other Ambulatory Visit (INDEPENDENT_AMBULATORY_CARE_PROVIDER_SITE_OTHER): Payer: Medicare Other

## 2022-04-01 VITALS — BP 128/74 | HR 93 | Temp 98.1°F | Wt 173.8 lb

## 2022-04-01 DIAGNOSIS — R7303 Prediabetes: Secondary | ICD-10-CM | POA: Diagnosis not present

## 2022-04-01 DIAGNOSIS — Z114 Encounter for screening for human immunodeficiency virus [HIV]: Secondary | ICD-10-CM

## 2022-04-01 DIAGNOSIS — E890 Postprocedural hypothyroidism: Secondary | ICD-10-CM

## 2022-04-01 DIAGNOSIS — E7800 Pure hypercholesterolemia, unspecified: Secondary | ICD-10-CM

## 2022-04-01 DIAGNOSIS — Z0000 Encounter for general adult medical examination without abnormal findings: Secondary | ICD-10-CM

## 2022-04-01 DIAGNOSIS — R5383 Other fatigue: Secondary | ICD-10-CM

## 2022-04-01 DIAGNOSIS — Z1211 Encounter for screening for malignant neoplasm of colon: Secondary | ICD-10-CM

## 2022-04-01 LAB — LIPID PANEL
Cholesterol: 201 mg/dL — ABNORMAL HIGH (ref 0–200)
HDL: 56.5 mg/dL (ref >39.00)
LDL Cholesterol: 116 mg/dL — ABNORMAL HIGH (ref 0–99)
NonHDL: 144.43
Total CHOL/HDL Ratio: 4
Triglycerides: 141 mg/dL (ref 0.0–149.0)
VLDL: 28.2 mg/dL (ref 0.0–40.0)

## 2022-04-01 LAB — CBC WITH DIFFERENTIAL/PLATELET
Basophils Absolute: 0 10*3/uL (ref 0.0–0.1)
Basophils Relative: 0.3 % (ref 0.0–3.0)
Eosinophils Absolute: 0.2 10*3/uL (ref 0.0–0.7)
Eosinophils Relative: 2.3 % (ref 0.0–5.0)
HCT: 39.6 % (ref 36.0–46.0)
Hemoglobin: 13.2 g/dL (ref 12.0–15.0)
Lymphocytes Relative: 13.1 % (ref 12.0–46.0)
Lymphs Abs: 0.9 10*3/uL (ref 0.7–4.0)
MCHC: 33.3 g/dL (ref 30.0–36.0)
MCV: 90.7 fl (ref 78.0–100.0)
Monocytes Absolute: 0.5 10*3/uL (ref 0.1–1.0)
Monocytes Relative: 6.7 % (ref 3.0–12.0)
Neutro Abs: 5.3 10*3/uL (ref 1.4–7.7)
Neutrophils Relative %: 77.6 % — ABNORMAL HIGH (ref 43.0–77.0)
Platelets: 317 10*3/uL (ref 150.0–400.0)
RBC: 4.36 Mil/uL (ref 3.87–5.11)
RDW: 13.9 % (ref 11.5–15.5)
WBC: 6.8 10*3/uL (ref 4.0–10.5)

## 2022-04-01 LAB — COMPREHENSIVE METABOLIC PANEL
ALT: 21 U/L (ref 0–35)
AST: 20 U/L (ref 0–37)
Albumin: 4.2 g/dL (ref 3.5–5.2)
Alkaline Phosphatase: 62 U/L (ref 39–117)
BUN: 19 mg/dL (ref 6–23)
CO2: 27 mEq/L (ref 19–32)
Calcium: 9.1 mg/dL (ref 8.4–10.5)
Chloride: 102 mEq/L (ref 96–112)
Creatinine, Ser: 0.95 mg/dL (ref 0.40–1.20)
GFR: 63.09 mL/min (ref >60.00)
Glucose, Bld: 88 mg/dL (ref 70–99)
Potassium: 4.2 mEq/L (ref 3.5–5.1)
Sodium: 138 mEq/L (ref 135–145)
Total Bilirubin: 0.4 mg/dL (ref 0.2–1.2)
Total Protein: 7.1 g/dL (ref 6.0–8.3)

## 2022-04-01 LAB — LDL CHOLESTEROL, DIRECT: Direct LDL: 140 mg/dL

## 2022-04-01 LAB — TSH: TSH: 5.76 u[IU]/mL — ABNORMAL HIGH (ref 0.35–5.50)

## 2022-04-01 LAB — MICROALBUMIN / CREATININE URINE RATIO
Creatinine,U: 121.1 mg/dL
Microalb Creat Ratio: 0.6 mg/g (ref 0.0–30.0)
Microalb, Ur: <0.7 mg/dL (ref 0.0–1.9)

## 2022-04-01 MED ORDER — SYNTHROID 75 MCG PO TABS: ORAL_TABLET | ORAL | 1 refills | Status: DC

## 2022-04-01 MED ORDER — ONDANSETRON HCL 4 MG PO TABS: 4.0000 mg | ORAL_TABLET | Freq: Three times a day (TID) | ORAL | 2 refills | Status: DC | PRN

## 2022-04-01 MED ORDER — SEMAGLUTIDE (2 MG/DOSE) 8 MG/3ML ~~LOC~~ SOPN: 2.0000 mg | PEN_INJECTOR | SUBCUTANEOUS | 2 refills | Status: DC

## 2022-04-01 MED ORDER — CYANOCOBALAMIN 1000 MCG/ML IJ SOLN: 1000.0000 ug | INTRAMUSCULAR | 2 refills | Status: DC

## 2022-04-01 NOTE — Progress Notes (Unsigned)
The patient is here for the Welcome to  Medicare  preventive visit     has a past medical history of Anxiety, Heart murmur, Hypothyroidism, Osteoarthritis, and Thyroid cancer ([name]).    reports that she has never smoked. She has never used smokeless tobacco. She reports that she does not drink alcohol and does not use drugs.   The roster of all physicians providing medical care to patient : Patient Care Team: Emily Mc, MD as PCP - General (Internal Medicine)  Activities of daily living:  The patient is 100% independent in all ADLs: dressing, toileting, feeding as well as independent mobility Fall risk was assessed by direct patient evaluation of patient's balance, gait and ability to risk from a chair and from a kneeling position. Home safety : The patient has smoke detectors in the home. They wear seatbelts.  There are no firearms at home. There is no violence in the home.  Patient has seen their eye doctor in the last year.   Visual acuity was assessed today  and was 20/20 with correction lenses. Patient denies hearing difficulty with regard to whispered voices and some television programs and has  deferred audiologic testing in the last year.   There is no risks for hepatitis, STDs or HIV. There is no   history of blood transfusion. They have no travel history to infectious disease endemic areas of the world.  The patient has seen their dentist in the last six month.  They do not  have excessive sun exposure. Discussed the need for sun protection: hats, long sleeves and use of sunscreen if there is significant sun exposure.   Diet: the importance of a healthy diet is discussed. They do have a healthy diet.  The benefits of regular aerobic exercise were discussed. Patient walks 4 times per week ,  a minimum of 20 minutes.   Depression screen:      03/31/2022    4:49 PM 12/17/2021    8:30 AM 09/24/2021    8:27 AM 03/31/2021    8:14 AM 12/24/2020    8:10 AM  Depression screen PHQ 2/9   Decreased Interest 0 0 0 0 0  Down, Depressed, Hopeless 0 0 0 0 0  PHQ - 2 Score 0 0 0 0 0  Altered sleeping   0    Tired, decreased energy   0    Change in appetite   0    Feeling bad or failure about yourself    0    Trouble concentrating   0    Moving slowly or fidgety/restless   0    Suicidal thoughts   0    PHQ-9 Score   0    Difficult doing work/chores   Not difficult at all         Cognitive assessment: the patient manages all their financial and personal affairs and is actively engaged. They could relate day,date,year and events; recalled 2/3 objects at 3 minutes; performed clock-face test normally.  The following portions of the patient's history were reviewed and updated as appropriate: allergies, current medications, past family history, past medical history,  past surgical history, past social history  and problem list.  During the course of the visit the patient was educated and counseled about appropriate screening and preventive services including : fall prevention , diabetes screening, nutrition counseling, colorectal cancer screening, and recommended immunizations   Immunization History  Administered Date(s) Administered   Tdap 10/14/2016  HMLISTPATIENTFRIENDLY@ Health Maintenance Due  Topic Date Due   Fecal DNA (Cologuard)  Never done   Zoster Vaccines- Shingrix (1 of 2) Never done   Pneumonia Vaccine 74+ Years old (1 - PCV) 03/29/2022    Last skin cancer screening   [facility] [place]   Vital Signs: There were no vitals taken for this visit.   Exam: General appearance: alert, cooperative and appears stated age Head: Normocephalic, without obvious abnormality, atraumatic Eyes: conjunctivae/corneas clear. PERRL, EOM's intact. Fundi benign. Ears: normal TM's and external ear canals both ears Nose: Nares normal. Septum midline. Mucosa normal. No drainage or sinus tenderness. Throat: lips, mucosa, and tongue normal; teeth and gums  normal Neck: no adenopathy, no carotid bruit, no JVD, supple, symmetrical, trachea midline and thyroid not enlarged, symmetric, no tenderness/mass/nodules Lungs: clear to auscultation bilaterally Breasts: DEFERRED by patient  Heart: regular rate and rhythm, S1, S2 normal, no murmur, click, rub or gallop Abdomen: soft, non-tender; bowel sounds normal; no masses,  no organomegaly Extremities: extremities normal, atraumatic, no cyanosis or edema Pulses: 2+ and symmetric Skin: Skin color, texture, turgor normal. No rashes or lesions Neurologic: Alert and oriented X 3, normal strength and tone. Normal symmetric reflexes. Normal coordination and gait.     End of Life Discussion and Planning   During the course of the visit , End of Life objectives were discussed at length.  Patient does not have a living will in place or a healthcare power of attorney.  Patient  was given printed information about advance directives and encouraged to return after discussing with their family.  Review of Opioid Prescriptions    Patient does not have a current opioid prescription Patient's risk factors for opioid use disorder was reviewed and includes no issues Treatment of pain using non-opioid alternatives was reviewed  and encouraged   Patient risk for potential substance abuse was assessed and addressed with counselling.

## 2022-04-03 NOTE — Assessment & Plan Note (Signed)

## 2022-04-04 LAB — HIV ANTIBODY (ROUTINE TESTING W REFLEX): HIV 1&2 Ab, 4th Generation: NONREACTIVE

## 2022-04-04 LAB — HEMOGLOBIN A1C: Hgb A1c MFr Bld: 5.8 % (ref 4.6–6.5)

## 2022-04-05 ENCOUNTER — Telehealth: Payer: Self-pay

## 2022-04-05 NOTE — Telephone Encounter (Signed)
Pt called back and I read the message to her but she would still like a call back from the cma

## 2022-04-05 NOTE — Telephone Encounter (Signed)
-----   Message from Crecencio Mc, MD sent at 04/03/2022  4:43 PM EDT ----- Your thyroid is slightly  underactive on your current levothyroxine dose of 75 mcg daily  If you have already picked up your refill from Friday,   you do not have to waste it!  You  can approximate the next higher dose by doubling your dose on SUNDAYS ONLY .  (Here"s the math:  75 mcg  x 8 doses = 600 mcg weekly   and  88 mcg x 7 doses = 616 mcg weekly.  Let me know .  If you haven't picked it up,  I'll send the 88 mcg dose to [facility] .  Either way we should repeat your TSH after a minimum of 6 weeks   The rest of your labs are normal  Regards,   Deborra Medina, MD

## 2022-04-05 NOTE — Telephone Encounter (Signed)
Pt called back and she was scheduled for the 6 week lab recheck. Pt stated that she saw on her lab work that the neutrophils were elevated. She was wondering what that is and ifit is something she needs to be worried about.

## 2022-04-05 NOTE — Telephone Encounter (Signed)
LMTCB in regards to lab results.  

## 2022-04-06 NOTE — Telephone Encounter (Signed)
Left a detailed message per pt request.

## 2022-04-23 LAB — COLOGUARD: COLOGUARD: NEGATIVE

## 2022-04-28 ENCOUNTER — Ambulatory Visit (INDEPENDENT_AMBULATORY_CARE_PROVIDER_SITE_OTHER): Payer: Medicare Other | Admitting: Dermatology

## 2022-04-28 ENCOUNTER — Encounter: Payer: Self-pay | Admitting: Dermatology

## 2022-04-28 ENCOUNTER — Other Ambulatory Visit: Payer: Self-pay

## 2022-04-28 DIAGNOSIS — L988 Other specified disorders of the skin and subcutaneous tissue: Secondary | ICD-10-CM

## 2022-04-28 DIAGNOSIS — L814 Other melanin hyperpigmentation: Secondary | ICD-10-CM

## 2022-04-28 DIAGNOSIS — L820 Inflamed seborrheic keratosis: Secondary | ICD-10-CM | POA: Diagnosis not present

## 2022-04-28 DIAGNOSIS — L821 Other seborrheic keratosis: Secondary | ICD-10-CM

## 2022-04-28 DIAGNOSIS — Z1283 Encounter for screening for malignant neoplasm of skin: Secondary | ICD-10-CM

## 2022-04-28 DIAGNOSIS — D2372 Other benign neoplasm of skin of left lower limb, including hip: Secondary | ICD-10-CM

## 2022-04-28 DIAGNOSIS — D229 Melanocytic nevi, unspecified: Secondary | ICD-10-CM

## 2022-04-28 DIAGNOSIS — L80 Vitiligo: Secondary | ICD-10-CM | POA: Diagnosis not present

## 2022-04-28 DIAGNOSIS — L578 Other skin changes due to chronic exposure to nonionizing radiation: Secondary | ICD-10-CM

## 2022-04-28 MED ORDER — OPZELURA 1.5 % EX CREA: TOPICAL_CREAM | CUTANEOUS | 3 refills | Status: DC

## 2022-04-28 NOTE — Patient Instructions (Addendum)
Cryotherapy Aftercare  Wash gently with soap and water everyday.   Apply Vaseline daily until healed.    Start Opzelura cream twice daily to affected areas. Do not use on more than 10% body surface at a time.   Your prescription was sent to [facility] in [place]. A representative from [place] will contact you within 3 business hours to verify your address and insurance information to schedule a free delivery. If for any reason you do not receive a phone call from them, please reach out to them. Their phone number is [phone] and their hours are Monday-Friday 9:00 am-5:00 pm.     Will prescribe [facility] Anti-Aging Tretinoin 0.025%/Niacinamide/Vitamin C/Vitamin E/Turmeric/Resveratrol with Hyaluronic Acid. Apply pea sized amount nightly to the entire face.  The patient was advised this is not covered by insurance since it is made by a compounding pharmacy. They will receive an email to check out and the medication will be mailed to their home.   Topical retinoid medications like tretinoin can cause dryness and irritation when first started. Only apply a pea-sized amount to the entire affected area. Avoid applying it around the eyes, edges of mouth and creases at the nose. If you experience irritation, use a good moisturizer first and/or apply the medicine less often. If you are doing well with the medicine, you can increase how often you use it until you are applying every night. Be careful with sun protection while using this medication as it can make you sensitive to the sun. This medicine should not be used by pregnant women.     Instructions for [facility] Medications  One or more of your medications was sent to the [facility] mail order compounding pharmacy. You will receive an email from them and can purchase the medicine through that link. It will then be mailed to your home at the address you confirmed. If for any reason you do not receive an email from  them, please check your spam folder. If you still do not find the email, please let us know. [facility] phone number is [phone].     Recommend taking Heliocare sun protection supplement daily in sunny weather for additional sun protection. For maximum protection on the sunniest days, you can take up to 2 capsules of regular Heliocare OR take 1 capsule of Heliocare Ultra. For prolonged exposure (such as a full day in the sun), you can repeat your dose of the supplement 4 hours after your first dose. Heliocare can be purchased at [employer], at some [facility] or at [email or web address].     Recommend daily broad spectrum sunscreen SPF 30+ to sun-exposed areas, reapply every 2 hours as needed. Call for new or changing lesions.  Staying in the shade or wearing long sleeves, sun glasses (UVA+UVB protection) and wide brim hats (4-inch brim around the entire circumference of the hat) are also recommended for sun protection.    Seborrheic Keratosis (Wisdom Spots)  What causes seborrheic keratoses? Seborrheic keratoses are harmless, common skin growths that first appear during adult life.  As time goes by, more growths appear.  Some people may develop a large number of them.  Seborrheic keratoses appear on both covered and uncovered body parts.  They are not caused by sunlight.  The tendency to develop seborrheic keratoses can be inherited.  They vary in color from skin-colored to gray, brown, or even black.  They can be either smooth or have a rough, warty surface.   Seborrheic keratoses are  superficial and look as if they were stuck on the skin.  Under the microscope this type of keratosis looks like layers upon layers of skin.  That is why at times the top layer may seem to fall off, but the rest of the growth remains and re-grows.    Treatment Seborrheic keratoses do not need to be treated, but can easily be removed in the office.  Seborrheic keratoses often cause symptoms when they rub  on clothing or jewelry.  Lesions can be in the way of shaving.  If they become inflamed, they can cause itching, soreness, or burning.  Removal of a seborrheic keratosis can be accomplished by freezing, burning, or surgery. If any spot bleeds, scabs, or grows rapidly, please return to have it checked, as these can be an indication of a skin cancer.    Melanoma ABCDEs  Melanoma is the most dangerous type of skin cancer, and is the leading cause of death from skin disease.  You are more likely to develop melanoma if you: Have light-colored skin, light-colored eyes, or red or blond hair Spend a lot of time in the sun Tan regularly, either outdoors or in a tanning bed Have had blistering sunburns, especially during childhood Have a close family member who has had a melanoma Have atypical moles or large birthmarks  Early detection of melanoma is key since treatment is typically straightforward and cure rates are extremely high if we catch it early.   The first sign of melanoma is often a change in a mole or a new dark spot.  The ABCDE system is a way of remembering the signs of melanoma.  A for asymmetry:  The two halves do not match. B for border:  The edges of the growth are irregular. C for color:  A mixture of colors are present instead of an even brown color. D for diameter:  Melanomas are usually (but not always) greater than 49m - the size of a pencil eraser. E for evolution:  The spot keeps changing in size, shape, and color.  Please check your skin once per month between visits. You can use a small mirror in front and a large mirror behind you to keep an eye on the back side or your body.   If you see any new or changing lesions before your next follow-up, please call to schedule a visit.  Please continue daily skin protection including broad spectrum sunscreen SPF 30+ to sun-exposed areas, reapplying every 2 hours as needed when you're outdoors.   Staying in the shade or wearing  long sleeves, sun glasses (UVA+UVB protection) and wide brim hats (4-inch brim around the entire circumference of the hat) are also recommended for sun protection.    Due to recent changes in healthcare laws, you may see results of your pathology and/or laboratory studies on MyChart before the doctors have had a chance to review them. We understand that in some cases there may be results that are confusing or concerning to you. Please understand that not all results are received at the same time and often the doctors may need to interpret multiple results in order to provide you with the best plan of care or course of treatment. Therefore, we ask that you please give u[place]2 business days to thoroughly review all your results before contacting the office for clarification. Should we see a critical lab result, you will be contacted sooner.   If You Need Anything After Your Visit  If you have  any questions or concerns for your doctor, please call our main line at [phone] and press option 4 to reach your doctor's medical assistant. If no one answers, please leave a voicemail as directed and we will return your call as soon as possible. Messages left after 4 pm will be answered the following business day.   You may also send [place] a message via [place]. We typically respond to MyChart messages within 1-2 business days.  For prescription refills, please ask your pharmacy to contact our office. Our fax number is [phone].  If you have an urgent issue when the clinic is closed that cannot wait until the next business day, you can page your doctor at the number below.    Please note that while we do our best to be available for urgent issues outside of office hours, we are not available 24/7.   If you have an urgent issue and are unable to reach [place], you may choose to seek medical care at your doctor's office, retail clinic, urgent care center, or emergency room.  If you have a medical emergency, please  immediately call 911 or go to the emergency department.  Pager Numbers  - Dr. Nehemiah Massed: [phone]  - Dr. Laurence Ferrari: [phone]  - Dr. Nicole Kindred: [phone]  In the event of inclement weather, please call our main line at [phone] for an update on the status of any delays or closures.  Dermatology Medication Tips: Please keep the boxes that topical medications come in in order to help keep track of the instructions about where and how to use these. Pharmacies typically print the medication instructions only on the boxes and not directly on the medication tubes.   If your medication is too expensive, please contact our office at [phone] option 4 or send [place] a message through [place].   We are unable to tell what your co-pay for medications will be in advance as this is different depending on your insurance coverage. However, we may be able to find a substitute medication at lower cost or fill out paperwork to get insurance to cover a needed medication.   If a prior authorization is required to get your medication covered by your insurance company, please allow [place] 1-2 business days to complete this process.  Drug prices often vary depending on where the prescription is filled and some pharmacies may offer cheaper prices.  The website www.goodrx.com contains coupons for medications through different pharmacies. The prices here do not account for what the cost may be with help from insurance (it may be cheaper with your insurance), but the website can give you the price if you did not use any insurance.  - You can print the associated coupon and take it with your prescription to the pharmacy.  - You may also stop by our office during regular business hours and pick up a GoodRx coupon card.  - If you need your prescription sent electronically to a different pharmacy, notify our office through [facility] or by phone at [phone] option 4.     Si Usted Necesita Algo Despus  de Su Visita  Tambin puede enviarnos un mensaje a travs de Pharmacist, community. Por lo general respondemos a los mensajes de MyChart en el transcurso de 1 a 2 das hbiles.  Para renovar recetas, por favor pida a su farmacia que se ponga en contacto con nuestra oficina. Harland Dingwall de fax es [place] [phone].  Si tiene un asunto urgente cuando la clnica est cerrada y que  no puede esperar hasta el siguiente da hbil, puede llamar/localizar a su doctor(a) al nmero que aparece a continuacin.   Por favor, tenga en cuenta que aunque hacemos todo lo posible para estar disponibles para asuntos urgentes fuera del horario de [place], no estamos disponibles las 24 horas del da, los 7 das de la [place].   Si tiene un problema urgente y no puede comunicarse con nosotros, puede optar por buscar atencin mdica  en el consultorio de su doctor(a), en una clnica privada, en un centro de atencin urgente o en una sala de emergencias.  Si tiene Engineering geologist, por favor llame inmediatamente al 911 o vaya a la sala de emergencias.  Nmeros de bper  - Dr. Nehemiah Massed: [phone]  - Dra. Moye: [phone]  - Dra. Nicole Kindred: [phone]  En caso de inclemencias del [place], por favor llame a Johnsie Kindred principal al [phone] para una actualizacin sobre el [place] de cualquier retraso o cierre.  Consejos para la medicacin en dermatologa: Por favor, guarde las cajas en las que vienen los medicamentos de uso tpico para ayudarle a seguir las instrucciones sobre dnde y cmo usarlos. Las farmacias generalmente imprimen las instrucciones del medicamento slo en las cajas y no directamente en los tubos del [place].   Si su medicamento es muy caro, por favor, pngase en contacto con Zigmund Daniel llamando al [phone] y presione la opcin 4 o envenos un mensaje a travs de Pharmacist, community.   No podemos decirle cul ser su copago por los medicamentos por adelantado ya que esto es diferente dependiendo  de la cobertura de su seguro. Sin embargo, es posible que podamos encontrar un medicamento sustituto a Electrical engineer un formulario para que el seguro cubra el medicamento que se considera necesario.   Si se requiere una autorizacin previa para que su compaa de seguros [place] su medicamento, por favor permtanos de 1 a 2 das hbiles para completar este proceso.  Los precios de los medicamentos varan con frecuencia dependiendo del Environmental consultant de dnde se surte la receta y alguna farmacias pueden ofrecer precios ms baratos.  El sitio web www.goodrx.com tiene cupones para medicamentos de Airline pilot. Los precios aqu no tienen en cuenta lo que podra costar con la ayuda del seguro (puede ser ms barato con su seguro), pero el sitio web puede darle el precio si no utiliz Research scientist (physical sciences).  - Puede imprimir el cupn correspondiente y llevarlo con su receta a la farmacia.  - Tambin puede pasar por nuestra oficina durante el horario de atencin regular y Charity fundraiser una tarjeta de cupones de GoodRx.  - Si necesita que su receta se enve electrnicamente a una farmacia diferente, informe a nuestra oficina a travs de MyChart de [place] o por telfono llamando al [phone] y presione la opcin 4.

## 2022-04-28 NOTE — Progress Notes (Signed)
Follow-Up Visit   Subjective  Emily Arellano is a 66 y.o. female who presents for the following: Annual Exam (Skin cancer screening. No personal Hx of skin cancer. No family Hx of skin cancer).  The patient presents for Total-Body Skin Exam (TBSE) for skin cancer screening and mole check.  The patient has spots, moles and lesions to be evaluated, some may be new or changing and the patient has concerns that these could be cancer.   The following portions of the chart were reviewed this encounter and updated as appropriate:  Tobacco  Allergies  Meds  Problems  Med Hx  Surg Hx  Fam Hx      Review of Systems: No other skin or systemic complaints except as noted in HPI or Assessment and Plan.   Objective  Well appearing patient in no apparent distress; mood and affect are within normal limits.  A full examination was performed including scalp, head, eyes, ears, nose, lips, neck, chest, axillae, abdomen, back, buttocks, bilateral upper extremities, bilateral lower extremities, hands, feet, fingers, toes, fingernails, and toenails. All findings within normal limits unless otherwise noted below.  Left Forearm, forehead Depigmented macules/patches  face Rhytides and volume loss.   Left Mid Back x1 Erythematous keratotic or waxy stuck-on papule or plaque.   Assessment & Plan   Lentigines - Scattered tan macules - Due to sun exposure - Benign-appearing, observe - Recommend daily broad spectrum sunscreen SPF 30+ to sun-exposed areas, reapply every 2 hours as needed. - Call for any changes  Seborrheic Keratoses - Stuck-on, waxy, tan-brown papules and/or plaques  - Benign-appearing - Discussed benign etiology and prognosis. - Observe - Call for any changes  Melanocytic Nevi - Tan-brown and/or pink-flesh-colored symmetric macules and papules - Benign appearing on exam today - Observation - Call clinic for new or changing moles - Recommend daily use of broad spectrum spf  30+ sunscreen to sun-exposed areas.   Hemangiomas - Red papules - Discussed benign nature - Observe - Call for any changes  Actinic Damage - Chronic condition, secondary to cumulative UV/sun exposure - diffuse scaly erythematous macules with underlying dyspigmentation - Recommend daily broad spectrum sunscreen SPF 30+ to sun-exposed areas, reapply every 2 hours as needed.  - Staying in the shade or wearing long sleeves, sun glasses (UVA+UVB protection) and wide brim hats (4-inch brim around the entire circumference of the hat) are also recommended for sun protection.  - Call for new or changing lesions.  Skin cancer screening performed today.  Dermatofibroma. Left lateral thigh. - Firm pink/brown papulenodule with dimple sign - Benign appearing - Call for any changes   Vitiligo Left Forearm, forehead  Chronic and persistent condition with duration or expected duration over one year. Condition is bothersome/symptomatic for patient. Currently flared.  Vitiligo is a chronic autoimmune condition which causes loss of skin pigment and is commonly seen on the face and may also involve areas of trauma like hands, elbows, knees, and ankles. There is no cure and it is difficult to treat.  Treatments include topical steroids and other topical anti-inflammatory ointments/creams and topical and oral Jak inhibitors.  Sometimes narrow band UV light therapy or Xtrac laser is helpful, both of which require twice weekly treatments for at least 3-6 months.  Antioxidant vitamins, such as Vitamins A,C,E,D, Folic Acid and B12 may be added to enhance treatment.   Start Opzelura cream twice daily to affected areas. Do not use on more than 10% body surface at a time.  Ruxolitinib Phosphate (OPZELURA) 1.5 % CREA - Left Forearm, forehead Apply twice daily to affected areas of vitiligo.  Elastosis of skin face  Recommend Restylane Refyne for lips/perioral.  Will prescribe [facility] Anti-Aging  Tretinoin 0.025%/Niacinamide/Vitamin C/Vitamin E/Turmeric/Resveratrol with Hyaluronic Acid. Apply pea sized amount nightly to the entire face.  The patient was advised this is not covered by insurance since it is made by a compounding pharmacy. They will receive an email to check out and the medication will be mailed to their home.   Topical retinoid medications like tretinoin can cause dryness and irritation when first started. Only apply a pea-sized amount to the entire affected area. Avoid applying it around the eyes, edges of mouth and creases at the nose. If you experience irritation, use a good moisturizer first and/or apply the medicine less often. If you are doing well with the medicine, you can increase how often you use it until you are applying every night. Be careful with sun protection while using this medication as it can make you sensitive to the sun. This medicine should not be used by pregnant women.    Discussed chemical peels.   Inflamed seborrheic keratosis Left Mid Back x1  Symptomatic, irritating, patient would like treated. Benign-appearing.  Destruction of lesion - Left Mid Back x1  Destruction method: cryotherapy   Informed consent: discussed and consent obtained   Lesion destroyed using liquid nitrogen: Yes   Region frozen until ice ball extended beyond lesion: Yes   Outcome: patient tolerated procedure well with no complications   Post-procedure details: wound care instructions given   Additional details:  Prior to procedure, discussed risks of blister formation, small wound, skin dyspigmentation, or rare scar following cryotherapy. Recommend Vaseline ointment to treated areas while healing.    Return in about 1 year (around 04/29/2023) for TBSE.  I, Emelia Salisbury, CMA, am acting as scribe for Forest Gleason, MD.  Documentation: I have reviewed the above documentation for accuracy and completeness, and I agree with the above.  Forest Gleason, MD

## 2022-05-04 ENCOUNTER — Telehealth: Payer: Self-pay

## 2022-05-04 NOTE — Telephone Encounter (Signed)
In that case, I would recommend pimecrolimus cream (if covered by insurance or affordable) twice a day to affected areas.   For the arm, we can also add triamcinolone cream twice a day on weekends only. Avoid applying to face, groin, and axilla. Use as directed. Long-term use can cause thinning of the skin or stretch marks, so she should watch for lightening and thinning.   We could also add calcipotriene cream to use twice a day to all areas if she would like. It works as a Art therapist and helps the other medicines work faster.   All treatments for vitiligo take months to work.  There is also an in office laser treatment twice a week we could see if we could get covered Xtrac, which can have nice results if it is affordable and feasible to do twice weekly nurse visits.   Thank you!

## 2022-05-04 NOTE — Telephone Encounter (Signed)
Left VM for patient advising her information per Dr. Laurence Ferrari.  Asked for patient to return my call to let me know what treatment she prefers. aw

## 2022-05-04 NOTE — Telephone Encounter (Signed)
Patient's Opzelura was approved but due to [employer] and not eligable for copay savings card, it is leaving her with a $1000 copay.  Patient is asking for alternative medication.

## 2022-05-04 NOTE — Telephone Encounter (Signed)
Patient called asking where compounding cream went for her face.  Left patient detailed VM with prescription instructions.

## 2022-05-05 ENCOUNTER — Encounter: Payer: Self-pay | Admitting: Dermatology

## 2022-05-05 MED ORDER — TRIAMCINOLONE ACETONIDE 0.1 % EX CREA: 1.0000 | TOPICAL_CREAM | Freq: Two times a day (BID) | CUTANEOUS | 0 refills | Status: DC

## 2022-05-05 NOTE — Telephone Encounter (Signed)
Patient wants something to treat itching on her arm only at this time. She has [employer] and does not want any expensive medications. Patient declines treating vitiligo on forehead.   Triamcinolone sent in with instructions to use on weekends only. aw

## 2022-05-05 NOTE — Addendum Note (Signed)
Addended by: Johnsie Kindred R on: 05/05/2022 10:40 AM   Modules accepted: Orders

## 2022-05-12 ENCOUNTER — Telehealth: Payer: Self-pay | Admitting: Internal Medicine

## 2022-05-12 DIAGNOSIS — E890 Postprocedural hypothyroidism: Secondary | ICD-10-CM

## 2022-05-12 NOTE — Addendum Note (Signed)
Addended by: Crecencio Mc on: 05/12/2022 02:20 PM   Modules accepted: Orders

## 2022-05-12 NOTE — Telephone Encounter (Signed)
Patient has a lab appt 05/17/2022, there are no orders in. 

## 2022-05-17 ENCOUNTER — Other Ambulatory Visit (INDEPENDENT_AMBULATORY_CARE_PROVIDER_SITE_OTHER): Payer: Medicare Other

## 2022-05-17 DIAGNOSIS — E890 Postprocedural hypothyroidism: Secondary | ICD-10-CM

## 2022-05-17 LAB — TSH: TSH: 1.1 u[IU]/mL (ref 0.35–5.50)

## 2022-05-18 ENCOUNTER — Encounter: Payer: Self-pay | Admitting: Internal Medicine

## 2022-05-19 NOTE — Assessment & Plan Note (Addendum)
Thyroid function is WNL on current dose. Of 75 mcg daily  No current changes needed.

## 2022-05-20 NOTE — Telephone Encounter (Signed)
Patient is taking 2 of SYNTHROID 75 MCG tablet. She needs a new prescription for 88 MCG since Dr Derrel Nip told her to double up on her 15, or she will run out of medication.

## 2022-05-21 ENCOUNTER — Other Ambulatory Visit: Payer: Self-pay | Admitting: Internal Medicine

## 2022-05-21 MED ORDER — LEVOTHYROXINE SODIUM 88 MCG PO TABS: 88.0000 ug | ORAL_TABLET | Freq: Every day | ORAL | 1 refills | Status: DC

## 2022-05-30 ENCOUNTER — Telehealth: Payer: Self-pay

## 2022-05-30 NOTE — Telephone Encounter (Signed)
[name] Key: [facility] [place] Case ID: [id number] - Rx #: [id number] Need help? Call us at [phone] Status Sent to [facility] Temazepam '30MG'$  capsules Form [facility] Medicare Electronic Prior Authorization Request Form [phone] NCPDP) Original Claim Info [id number] *PUB*Reject Code [id number]! Action Required! Must provide Notice Medicare Prescription Drug Coverage and Your Rights. Click Print Reject button and give notice to pt., DRUG REQUIRES PRIOR AUTHORIZATION

## 2022-05-30 NOTE — Telephone Encounter (Signed)
Melanni [place] Key: [facility] [place] Case ID: [id number] - Rx #: [id number] Need help? Call us at [phone] Outcome Approved today Approved. This drug has been approved under the Member's Medicare Part D benefit. Approved quantity: 30 units per 30 day(s). You may fill up to a 90 day supply except for those on Specialty Tier 5, which can be filled up to a 30 day supply.

## 2022-06-15 ENCOUNTER — Other Ambulatory Visit: Payer: Self-pay | Admitting: Internal Medicine

## 2022-06-18 ENCOUNTER — Telehealth: Payer: Medicare Other | Admitting: Physician Assistant

## 2022-06-18 DIAGNOSIS — R0981 Nasal congestion: Secondary | ICD-10-CM

## 2022-06-18 MED ORDER — ATROVENT HFA 17 MCG/ACT IN AERS: 2.0000 | INHALATION_SPRAY | Freq: Four times a day (QID) | RESPIRATORY_TRACT | 0 refills | Status: DC | PRN

## 2022-06-18 MED ORDER — AMOXICILLIN-POT CLAVULANATE 875-125 MG PO TABS: 1.0000 | ORAL_TABLET | Freq: Two times a day (BID) | ORAL | 0 refills | Status: DC

## 2022-06-18 NOTE — Progress Notes (Signed)
Virtual Visit Consent   Emily Arellano, you are scheduled for a virtual visit with a [facility] provider today. Just as with appointments in the office, your consent must be obtained to participate. Your consent will be active for this visit and any virtual visit you may have with one of our providers in the next 365 days. If you have a MyChart account, a copy of this consent can be sent to you electronically.  As this is a virtual visit, video technology does not allow for your provider to perform a traditional examination. This may limit your provider's ability to fully assess your condition. If your provider identifies any concerns that need to be evaluated in person or the need to arrange testing (such as labs, EKG, etc.), we will make arrangements to do so. Although advances in technology are sophisticated, we cannot ensure that it will always work on either your end or our end. If the connection with a video visit is poor, the visit may have to be switched to a telephone visit. With either a video or telephone visit, we are not always able to ensure that we have a secure connection.  By engaging in this virtual visit, you consent to the provision of healthcare and authorize for your insurance to be billed (if applicable) for the services provided during this visit. Depending on your insurance coverage, you may receive a charge related to this service.  I need to obtain your verbal consent now. Are you willing to proceed with your visit today? Emily Arellano has provided verbal consent on 06/18/2022 for a virtual visit (video or telephone). Inda Coke, [place]  Date: 06/18/2022 10:36 AM  Virtual Visit via Video Note   I, Inda Coke, connected with  Emily Arellano  ([id number], 1956-05-23) on 06/18/22 at 11:00 AM EST by a video-enabled telemedicine application and verified that I am speaking with the correct person using two identifiers.  Location: Patient: Virtual Visit Location Patient:  Home Provider: Virtual Visit Location Provider: Home Office   I discussed the limitations of evaluation and management by telemedicine and the availability of in person appointments. The patient expressed understanding and agreed to proceed.    History of Present Illness: Emily Arellano is a 66 y.o. who identifies as a female who was assigned female at birth, and is being seen today for cough and congestion. She has had sinusitis symptoms for 3 weeks. Recent worsening. She works in a [employer] and has several grandchildren so she has multiple sick exposures on a regular basis. Denies: fevers, chills, n/v/d. She has had some body aches. No recent COVID test. She has concerns that she may be allergic to her christmas tree because this also happened last year around this time.  Problems:  Patient Active Problem List   Diagnosis Date Noted   Cystitis 04/03/2021   Bladder prolapse, female, acquired 12/03/2018   GERD (gastroesophageal reflux disease) 01/25/2018   Bursitis of left hip 03/19/2017   Welcome to Medicare preventive visit 10/16/2016   Cervicalgia 05/01/2016   Overweight (BMI 25.0-29.9) 12/24/2015   Insomnia 10/01/2015   Arthralgia 10/01/2015   Influenza vaccination declined 10/01/2015   Allergic rhinitis 01/27/2015   Family history of drug addiction 01/27/2015   Anxiety, generalized 01/27/2015   History of prolonged Q-T interval on ECG 01/27/2015   H/O malignant neoplasm of thyroid 01/27/2015   HLD (hyperlipidemia) 01/27/2015   Arthritis, degenerative 01/27/2015   Osteopenia 01/27/2015   Postsurgical hypothyroidism 01/27/2015   Post  menopausal syndrome 01/27/2015   Prediabetes 01/27/2015   TAH/BSO, HX OF 04/24/2007   PARATHYROIDECTOMY 04/24/2007    Allergies:  Allergies  Allergen Reactions   Sulfa Antibiotics Shortness Of Breath   Hydrocodone Itching   Codeine     itch   Doxycycline Hyclate     rash   Sulfamethoxazole-Trimethoprim Hives   Medications:  Current  Outpatient Medications:    amoxicillin-clavulanate (AUGMENTIN) 875-125 MG tablet, Take 1 tablet by mouth 2 (two) times daily., Disp: 14 tablet, Rfl: 0   ipratropium (ATROVENT HFA) 17 MCG/ACT inhaler, Inhale 2 puffs into the lungs every 6 (six) hours as needed for wheezing., Disp: 1 each, Rfl: 0   albuterol (VENTOLIN HFA) 108 (90 Base) MCG/ACT inhaler, Inhale into the lungs., Disp: , Rfl:    Cholecalciferol (VITAMIN D3) 2000 UNITS capsule, Take by mouth., Disp: , Rfl:    cyanocobalamin (VITAMIN B12) 1000 MCG/ML injection, Inject 1 mL (1,000 mcg total) into the muscle once a week., Disp: 4 mL, Rfl: 2   cyclobenzaprine (FLEXERIL) 10 MG tablet, Take 1 tablet (10 mg total) by mouth 3 (three) times daily., Disp: 270 tablet, Rfl: 1   fluticasone (FLONASE) 50 MCG/ACT nasal spray, USE ONE SPRAY INTO BOTH NOSTRILS EVERY DAY, Disp: 16 g, Rfl: 2   levothyroxine (SYNTHROID) 88 MCG tablet, Take 1 tablet (88 mcg total) by mouth daily before breakfast., Disp: 90 tablet, Rfl: 1   MAGNESIUM PO, Take 3-4 tablets by mouth at bedtime., Disp: , Rfl:    ondansetron (ZOFRAN) 4 MG tablet, Take 1 tablet (4 mg total) by mouth every 8 (eight) hours as needed for nausea or vomiting., Disp: 30 tablet, Rfl: 2   pimecrolimus (ELIDEL) 1 % cream, APPLY TO THE AFFECTED AREA ON THE SKIN EVERY DAY AS DIRECTED, Disp: 100 g, Rfl: 1   Ruxolitinib Phosphate (OPZELURA) 1.5 % CREA, Apply twice daily to affected areas of vitiligo., Disp: 60 g, Rfl: 3   Semaglutide, 2 MG/DOSE, 8 MG/3ML SOPN, Inject 2 mg as directed once a week., Disp: 3 mL, Rfl: 2   temazepam (RESTORIL) 30 MG capsule, TAKE 1 CAPSULE BY MOUTH AT BEDTIME AS NEEDED FOR SLEEP., Disp: 30 capsule, Rfl: 5   topiramate (TOPAMAX) 25 MG tablet, TAKE ONE TABLET BY MOUTH TWICE A DAY, Disp: 60 tablet, Rfl: 2   triamcinolone cream (KENALOG) 0.1 %, Apply 1 Application topically 2 (two) times daily. Friday, Saturday and Sunday only. Avoid face, groin and underarms., Disp: 45 g, Rfl:  0  Observations/Objective: Patient is well-developed, well-nourished in no acute distress.  Resting comfortably at home.  Head is normocephalic, atraumatic.  No labored breathing. Speech is clear and coherent with logical content.  Patient is alert and oriented at baseline.   Assessment and Plan: 1. Sinus congestion No red flags on exam.  Will initiate oral augmentin and atrovent nasal spray for per orders. Discussed taking medications as prescribed. Reviewed return precautions including worsening fever, SOB, worsening cough or other concerns. Push fluids and rest. I recommend that patient follow-up if symptoms worsen or persist despite treatment x 7-10 days, sooner if needed.   Follow Up Instructions: I discussed the assessment and treatment plan with the patient. The patient was provided an opportunity to ask questions and all were answered. The patient agreed with the plan and demonstrated an understanding of the instructions.  A copy of instructions were sent to the patient via MyChart unless otherwise noted below.   The patient was advised to call back or seek an in-person  evaluation if the symptoms worsen or if the condition fails to improve as anticipated.  Time:  I spent 5-10 minutes with the patient via telehealth technology discussing the above problems/concerns.    Inda Coke, [place]

## 2022-07-06 ENCOUNTER — Encounter: Payer: Self-pay | Admitting: Internal Medicine

## 2022-07-06 MED ORDER — SEMAGLUTIDE (2 MG/DOSE) 8 MG/3ML ~~LOC~~ SOPN: 2.0000 mg | PEN_INJECTOR | SUBCUTANEOUS | 2 refills | Status: DC

## 2022-07-23 ENCOUNTER — Other Ambulatory Visit: Payer: Self-pay | Admitting: Internal Medicine

## 2022-07-25 ENCOUNTER — Encounter: Payer: Self-pay | Admitting: Internal Medicine

## 2022-07-25 NOTE — Telephone Encounter (Signed)
Refilled: 01/20/2022 Last OV: 04/01/2022 Next OV: 04/07/2023

## 2022-07-29 ENCOUNTER — Telehealth: Payer: Medicare Other | Admitting: Family Medicine

## 2022-07-29 DIAGNOSIS — J0191 Acute recurrent sinusitis, unspecified: Secondary | ICD-10-CM

## 2022-07-29 MED ORDER — DOXYCYCLINE HYCLATE 100 MG PO TABS: 100.0000 mg | ORAL_TABLET | Freq: Two times a day (BID) | ORAL | 0 refills | Status: AC

## 2022-07-29 NOTE — Progress Notes (Signed)
Virtual Visit Consent   KALEE BROXTON, you are scheduled for a virtual visit with a [facility] provider today. Just as with appointments in the office, your consent must be obtained to participate. Your consent will be active for this visit and any virtual visit you may have with one of our providers in the next 365 days. If you have a MyChart account, a copy of this consent can be sent to you electronically.  As this is a virtual visit, video technology does not allow for your provider to perform a traditional examination. This may limit your provider's ability to fully assess your condition. If your provider identifies any concerns that need to be evaluated in person or the need to arrange testing (such as labs, EKG, etc.), we will make arrangements to do so. Although advances in technology are sophisticated, we cannot ensure that it will always work on either your end or our end. If the connection with a video visit is poor, the visit may have to be switched to a telephone visit. With either a video or telephone visit, we are not always able to ensure that we have a secure connection.  By engaging in this virtual visit, you consent to the provision of healthcare and authorize for your insurance to be billed (if applicable) for the services provided during this visit. Depending on your insurance coverage, you may receive a charge related to this service.  I need to obtain your verbal consent now. Are you willing to proceed with your visit today? SHIANNA BALLY has provided verbal consent on 07/29/2022 for a virtual visit (video or telephone). [name], NP  Date: 07/29/2022 10:29 AM  Virtual Visit via Video Note   I, [name], connected with  AEON KOORS  ([id number], June 14, 1956) on 07/29/22 at 10:30 AM EST by a video-enabled telemedicine application and verified that I am speaking with the correct person using two identifiers.  Location: Patient: Virtual Visit Location Patient:  Home Provider: Virtual Visit Location Provider: Home Office   I discussed the limitations of evaluation and management by telemedicine and the availability of in person appointments. The patient expressed understanding and agreed to proceed.    History of Present Illness: Emily Arellano is a 67 y.o. who identifies as a female who was assigned female at birth, and is being seen today for sinus symptoms- onset was on and off for 7 days-headache, sinus pressure, increased mucus yellow and green- started Mucinex and nasal spray and saline rinses- unsure if it ever fully cleared from use of Augmentin. Denies fevers, chills, chest pain, shortness of breath.    Problems:  Patient Active Problem List   Diagnosis Date Noted   Cystitis 04/03/2021   Bladder prolapse, female, acquired 12/03/2018   GERD (gastroesophageal reflux disease) 01/25/2018   Bursitis of left hip 03/19/2017   Welcome to Medicare preventive visit 10/16/2016   Cervicalgia 05/01/2016   Overweight (BMI 25.0-29.9) 12/24/2015   Insomnia 10/01/2015   Arthralgia 10/01/2015   Influenza vaccination declined 10/01/2015   Allergic rhinitis 01/27/2015   Family history of drug addiction 01/27/2015   Anxiety, generalized 01/27/2015   History of prolonged Q-T interval on ECG 01/27/2015   H/O malignant neoplasm of thyroid 01/27/2015   HLD (hyperlipidemia) 01/27/2015   Arthritis, degenerative 01/27/2015   Osteopenia 01/27/2015   Postsurgical hypothyroidism 01/27/2015   Post menopausal syndrome 01/27/2015   Prediabetes 01/27/2015   TAH/BSO, HX OF 04/24/2007   PARATHYROIDECTOMY 04/24/2007    Allergies:  Allergies  Allergen Reactions   Sulfa Antibiotics Shortness Of Breath   Hydrocodone Itching   Codeine     itch   Doxycycline Hyclate     rash   Sulfamethoxazole-Trimethoprim Hives   Medications:  Current Outpatient Medications:    albuterol (VENTOLIN HFA) 108 (90 Base) MCG/ACT inhaler, Inhale into the lungs., Disp: , Rfl:     amoxicillin-clavulanate (AUGMENTIN) 875-125 MG tablet, Take 1 tablet by mouth 2 (two) times daily., Disp: 14 tablet, Rfl: 0   Cholecalciferol (VITAMIN D3) 2000 UNITS capsule, Take by mouth., Disp: , Rfl:    cyanocobalamin (VITAMIN B12) 1000 MCG/ML injection, Inject 1 mL (1,000 mcg total) into the muscle once a week., Disp: 4 mL, Rfl: 2   cyclobenzaprine (FLEXERIL) 10 MG tablet, Take 1 tablet (10 mg total) by mouth 3 (three) times daily., Disp: 270 tablet, Rfl: 1   fluticasone (FLONASE) 50 MCG/ACT nasal spray, USE ONE SPRAY INTO BOTH NOSTRILS EVERY DAY, Disp: 16 g, Rfl: 2   ipratropium (ATROVENT HFA) 17 MCG/ACT inhaler, Inhale 2 puffs into the lungs every 6 (six) hours as needed for wheezing., Disp: 1 each, Rfl: 0   levothyroxine (SYNTHROID) 88 MCG tablet, Take 1 tablet (88 mcg total) by mouth daily before breakfast., Disp: 90 tablet, Rfl: 1   MAGNESIUM PO, Take 3-4 tablets by mouth at bedtime., Disp: , Rfl:    ondansetron (ZOFRAN) 4 MG tablet, Take 1 tablet (4 mg total) by mouth every 8 (eight) hours as needed for nausea or vomiting., Disp: 30 tablet, Rfl: 2   pimecrolimus (ELIDEL) 1 % cream, APPLY TO THE AFFECTED AREA ON THE SKIN EVERY DAY AS DIRECTED, Disp: 100 g, Rfl: 1   Ruxolitinib Phosphate (OPZELURA) 1.5 % CREA, Apply twice daily to affected areas of vitiligo., Disp: 60 g, Rfl: 3   Semaglutide, 2 MG/DOSE, 8 MG/3ML SOPN, Inject 2 mg as directed once a week., Disp: 3 mL, Rfl: 2   temazepam (RESTORIL) 30 MG capsule, TAKE 1 CAPSULE BY MOUTH AT BEDTIME AS NEEDED FOR SLEEP, Disp: 30 capsule, Rfl: 2   topiramate (TOPAMAX) 25 MG tablet, TAKE ONE TABLET BY MOUTH TWICE A DAY, Disp: 60 tablet, Rfl: 2   triamcinolone cream (KENALOG) 0.1 %, Apply 1 Application topically 2 (two) times daily. Friday, Saturday and Sunday only. Avoid face, groin and underarms., Disp: 45 g, Rfl: 0  Observations/Objective: Patient is well-developed, well-nourished in no acute distress.  Resting comfortably  at home.  Head is  normocephalic, atraumatic.  No labored breathing.  Speech is clear and coherent with logical content.  Patient is alert and oriented at baseline.    Assessment and Plan:   1. Acute recurrent sinusitis, unspecified location  - doxycycline (VIBRA-TABS) 100 MG tablet; Take 1 tablet (100 mg total) by mouth 2 (two) times daily for 10 days.  Dispense: 20 tablet; Refill: 0  -Take meds as prescribed -Rest -Use a cool mist humidifier especially during the winter months when heat dries out the air. - Use saline nose sprays frequently to help soothe nasal passages and promote drainage. -Saline irrigations of the nose can be very helpful if done frequently.             * 4X daily for 1 week*             * Use of a nettie pot can be helpful with this.  *Follow directions with this* *Boiled or distilled water only -stay hydrated by drinking plenty of fluids - Keep thermostat turn down low  to prevent drying out sinuses - For any cough or congestion- robitussin DM or Delsym as needed - For fever or aches or pains- take tylenol or ibuprofen as directed on bottle             * for fevers greater than 101 orally you may alternate ibuprofen and tylenol every 3 hours.  If you do not improve you will need a follow up visit in person.                  Reviewed side effects, risks and benefits of medication.    Patient acknowledged agreement and understanding of the plan.   Past Medical, Surgical, Social History, Allergies, and Medications have been Reviewed.   Follow Up Instructions: I discussed the assessment and treatment plan with the patient. The patient was provided an opportunity to ask questions and all were answered. The patient agreed with the plan and demonstrated an understanding of the instructions.  A copy of instructions were sent to the patient via MyChart unless otherwise noted below.    The patient was advised to call back or seek an in-person evaluation if the symptoms worsen  or if the condition fails to improve as anticipated.  Time:  I spent 10 minutes with the patient via telehealth technology discussing the above problems/concerns.    [name], NP

## 2022-07-29 NOTE — Patient Instructions (Signed)
[name], thank you for joining [name], NP for today's virtual visit.  While this provider is not your primary care provider (PCP), if your PCP is located in our provider database this encounter information will be shared with them immediately following your visit.   [facility] account gives you access to today's visit and all your visits, tests, and labs performed at [facility] " click here if you don't have a [facility] account or go to [email or web address]  Consent: (Patient) Emily Arellano provided verbal consent for this virtual visit at the beginning of the encounter.  Current Medications:  Current Outpatient Medications:    albuterol (VENTOLIN HFA) 108 (90 Base) MCG/ACT inhaler, Inhale into the lungs., Disp: , Rfl:    amoxicillin-clavulanate (AUGMENTIN) 875-125 MG tablet, Take 1 tablet by mouth 2 (two) times daily., Disp: 14 tablet, Rfl: 0   Cholecalciferol (VITAMIN D3) 2000 UNITS capsule, Take by mouth., Disp: , Rfl:    cyanocobalamin (VITAMIN B12) 1000 MCG/ML injection, Inject 1 mL (1,000 mcg total) into the muscle once a week., Disp: 4 mL, Rfl: 2   cyclobenzaprine (FLEXERIL) 10 MG tablet, Take 1 tablet (10 mg total) by mouth 3 (three) times daily., Disp: 270 tablet, Rfl: 1   fluticasone (FLONASE) 50 MCG/ACT nasal spray, USE ONE SPRAY INTO BOTH NOSTRILS EVERY DAY, Disp: 16 g, Rfl: 2   ipratropium (ATROVENT HFA) 17 MCG/ACT inhaler, Inhale 2 puffs into the lungs every 6 (six) hours as needed for wheezing., Disp: 1 each, Rfl: 0   levothyroxine (SYNTHROID) 88 MCG tablet, Take 1 tablet (88 mcg total) by mouth daily before breakfast., Disp: 90 tablet, Rfl: 1   MAGNESIUM PO, Take 3-4 tablets by mouth at bedtime., Disp: , Rfl:    ondansetron (ZOFRAN) 4 MG tablet, Take 1 tablet (4 mg total) by mouth every 8 (eight) hours as needed for nausea or vomiting., Disp: 30 tablet, Rfl: 2   pimecrolimus (ELIDEL) 1 % cream, APPLY TO THE AFFECTED AREA ON  THE SKIN EVERY DAY AS DIRECTED, Disp: 100 g, Rfl: 1   Ruxolitinib Phosphate (OPZELURA) 1.5 % CREA, Apply twice daily to affected areas of vitiligo., Disp: 60 g, Rfl: 3   Semaglutide, 2 MG/DOSE, 8 MG/3ML SOPN, Inject 2 mg as directed once a week., Disp: 3 mL, Rfl: 2   temazepam (RESTORIL) 30 MG capsule, TAKE 1 CAPSULE BY MOUTH AT BEDTIME AS NEEDED FOR SLEEP, Disp: 30 capsule, Rfl: 2   topiramate (TOPAMAX) 25 MG tablet, TAKE ONE TABLET BY MOUTH TWICE A DAY, Disp: 60 tablet, Rfl: 2   triamcinolone cream (KENALOG) 0.1 %, Apply 1 Application topically 2 (two) times daily. Friday, Saturday and Sunday only. Avoid face, groin and underarms., Disp: 45 g, Rfl: 0   Medications ordered in this encounter:  No orders of the defined types were placed in this encounter.    *If you need refills on other medications prior to your next appointment, please contact your pharmacy*  Follow-Up: Call back or seek an in-person evaluation if the symptoms worsen or if the condition fails to improve as anticipated.  [facility] [phone]  Other Instructions  -Take meds as prescribed -Rest -Use a cool mist humidifier especially during the winter months when heat dries out the air. - Use saline nose sprays frequently to help soothe nasal passages and promote drainage. -Saline irrigations of the nose can be very helpful if done frequently.             *  4X daily for 1 week*             * Use of a nettie pot can be helpful with this.  *Follow directions with this* *Boiled or distilled water only -stay hydrated by drinking plenty of fluids - Keep thermostat turn down low to prevent drying out sinuses - For any cough or congestion- robitussin DM or Delsym as needed - For fever or aches or pains- take tylenol or ibuprofen as directed on bottle             * for fevers greater than 101 orally you may alternate ibuprofen and tylenol every 3 hours.  If you do not improve you will need a follow up visit  in person.                  If you have been instructed to have an in-person evaluation today at a local Urgent Care facility, please use the link below. It will take you to a list of all of our available [facility], including address, phone number and hours of operation. Please do not delay care.  [facility]  If you or a family member do not have a primary care provider, use the link below to schedule a visit and establish care. When you choose a [facility] primary care physician or advanced practice provider, you gain a long-term partner in health. Find a Primary Care Provider  Learn more about [facility]'s in-office and virtual care options: [facility]

## 2022-08-12 ENCOUNTER — Other Ambulatory Visit: Payer: Self-pay | Admitting: Internal Medicine

## 2022-08-12 DIAGNOSIS — Z1231 Encounter for screening mammogram for malignant neoplasm of breast: Secondary | ICD-10-CM

## 2022-08-15 ENCOUNTER — Encounter: Payer: Self-pay | Admitting: Internal Medicine

## 2022-08-16 NOTE — Telephone Encounter (Signed)
PA is needed for Ozempic.

## 2022-08-18 NOTE — Telephone Encounter (Signed)
noted 

## 2022-08-18 NOTE — Telephone Encounter (Signed)
Patient Advocate Encounter   Received notification from [facility] that prior authorization for Ozempic is required.   PA submitted on 08/18/2022 Key [id number] Status is pending

## 2022-08-18 NOTE — Telephone Encounter (Signed)
Patient is due a injection of Ozempic today. Can she get a sample from office? She is waiting for a PA from insurance.

## 2022-08-18 NOTE — Telephone Encounter (Signed)
PA for Ozempic is needed.

## 2022-08-19 ENCOUNTER — Other Ambulatory Visit: Payer: Self-pay | Admitting: Internal Medicine

## 2022-08-19 NOTE — Telephone Encounter (Signed)
Ozempic will not be approved for pt because she does not have a dx of diabetes.

## 2022-09-01 ENCOUNTER — Other Ambulatory Visit (HOSPITAL_COMMUNITY): Payer: Self-pay

## 2022-09-01 NOTE — Telephone Encounter (Signed)
Medicare Part D does not cover any medication for weight loss/management. No PA submitted at this time.

## 2022-09-02 ENCOUNTER — Ambulatory Visit (INDEPENDENT_AMBULATORY_CARE_PROVIDER_SITE_OTHER): Payer: Medicare Other | Admitting: Internal Medicine

## 2022-09-02 ENCOUNTER — Encounter: Payer: Self-pay | Admitting: Internal Medicine

## 2022-09-02 VITALS — BP 108/70 | HR 76 | Temp 98.0°F | Resp 16 | Ht 66.0 in | Wt 165.0 lb

## 2022-09-02 DIAGNOSIS — G4709 Other insomnia: Secondary | ICD-10-CM

## 2022-09-02 DIAGNOSIS — E890 Postprocedural hypothyroidism: Secondary | ICD-10-CM | POA: Diagnosis not present

## 2022-09-02 DIAGNOSIS — E663 Overweight: Secondary | ICD-10-CM | POA: Diagnosis not present

## 2022-09-02 DIAGNOSIS — R7303 Prediabetes: Secondary | ICD-10-CM

## 2022-09-02 LAB — COMPREHENSIVE METABOLIC PANEL
ALT: 18 U/L (ref 0–35)
AST: 17 U/L (ref 0–37)
Albumin: 4.4 g/dL (ref 3.5–5.2)
Alkaline Phosphatase: 75 U/L (ref 39–117)
BUN: 13 mg/dL (ref 6–23)
CO2: 29 mEq/L (ref 19–32)
Calcium: 8.7 mg/dL (ref 8.4–10.5)
Chloride: 100 mEq/L (ref 96–112)
Creatinine, Ser: 0.9 mg/dL (ref 0.40–1.20)
GFR: 67.13 mL/min (ref >60.00)
Glucose, Bld: 93 mg/dL (ref 70–99)
Potassium: 4.4 mEq/L (ref 3.5–5.1)
Sodium: 138 mEq/L (ref 135–145)
Total Bilirubin: 0.4 mg/dL (ref 0.2–1.2)
Total Protein: 6.8 g/dL (ref 6.0–8.3)

## 2022-09-02 LAB — TSH: TSH: 0.59 u[IU]/mL (ref 0.35–5.50)

## 2022-09-02 LAB — HEMOGLOBIN A1C: Hgb A1c MFr Bld: 5.7 % (ref 4.6–6.5)

## 2022-09-02 MED ORDER — TEMAZEPAM 30 MG PO CAPS: ORAL_CAPSULE | ORAL | 5 refills | Status: DC

## 2022-09-02 MED ORDER — PANTOPRAZOLE SODIUM 40 MG PO TBEC: 40.0000 mg | DELAYED_RELEASE_TABLET | Freq: Every day | ORAL | 1 refills | Status: DC

## 2022-09-02 MED ORDER — LEVOTHYROXINE SODIUM 88 MCG PO TABS: ORAL_TABLET | ORAL | 3 refills | Status: DC

## 2022-09-02 MED ORDER — SEMAGLUTIDE (2 MG/DOSE) 8 MG/3ML ~~LOC~~ SOPN: 2.0000 mg | PEN_INJECTOR | SUBCUTANEOUS | 2 refills | Status: DC

## 2022-09-02 NOTE — Progress Notes (Unsigned)
Subjective:  Patient ID: Emily Arellano, female    DOB: 01-17-1956  Age: 67 y.o. MRN: [id number]  CC: There were no encounter diagnoses.   HPI Emily Arellano presents for management of insomnia and overweight Chief Complaint  Patient presents with   Medical Management of Chronic Issues   1) Insomnia:  temazepam dependent   2) overweight:  started at 188 lbs,  lost weight on optavia,  started ozempic 6 moths ago   181 as highest weight.  Currently at 165 .willing to pay out of pocket for medication for the health benefits derived from it.    Outpatient Medications Prior to Visit  Medication Sig Dispense Refill   cyclobenzaprine (FLEXERIL) 10 MG tablet Take 1 tablet (10 mg total) by mouth 3 (three) times daily. 270 tablet 1   ondansetron (ZOFRAN) 4 MG tablet Take 1 tablet (4 mg total) by mouth every 8 (eight) hours as needed for nausea or vomiting. 30 tablet 2   Cholecalciferol (VITAMIN D3) 2000 UNITS capsule Take by mouth.     cyanocobalamin (VITAMIN B12) 1000 MCG/ML injection Inject 1 mL (1,000 mcg total) into the muscle once a week. 4 mL 2   fluticasone (FLONASE) 50 MCG/ACT nasal spray USE ONE SPRAY INTO BOTH NOSTRILS EVERY DAY 16 g 2   levothyroxine (SYNTHROID) 88 MCG tablet TAKE 1 TABLET EVERY DAY ON EMPTY STOMACHWITH A GLASS OF WATER AT LEAST 30-60 MINBEFORE BREAKFAST 90 tablet 1   MAGNESIUM PO Take 3-4 tablets by mouth at bedtime.     Semaglutide, 2 MG/DOSE, 8 MG/3ML SOPN Inject 2 mg as directed once a week. 3 mL 2   temazepam (RESTORIL) 30 MG capsule TAKE 1 CAPSULE BY MOUTH AT BEDTIME AS NEEDED FOR SLEEP 30 capsule 2   triamcinolone cream (KENALOG) 0.1 % Apply 1 Application topically 2 (two) times daily. Friday, Saturday and Sunday only. Avoid face, groin and underarms. 45 g 0   albuterol (VENTOLIN HFA) 108 (90 Base) MCG/ACT inhaler Inhale into the lungs.     ipratropium (ATROVENT HFA) 17 MCG/ACT inhaler Inhale 2 puffs into the lungs every 6 (six) hours as needed for wheezing.  1 each 0   pimecrolimus (ELIDEL) 1 % cream APPLY TO THE AFFECTED AREA ON THE SKIN EVERY DAY AS DIRECTED 100 g 1   Ruxolitinib Phosphate (OPZELURA) 1.5 % CREA Apply twice daily to affected areas of vitiligo. 60 g 3   topiramate (TOPAMAX) 25 MG tablet TAKE ONE TABLET BY MOUTH TWICE A DAY 60 tablet 2   No facility-administered medications prior to visit.    Review of Systems;  Patient denies headache, fevers, malaise, unintentional weight loss, skin rash, eye pain, sinus congestion and sinus pain, sore throat, dysphagia,  hemoptysis , cough, dyspnea, wheezing, chest pain, palpitations, orthopnea, edema, abdominal pain, nausea, melena, diarrhea, constipation, flank pain, dysuria, hematuria, urinary  Frequency, nocturia, numbness, tingling, seizures,  Focal weakness, Loss of consciousness,  Tremor, insomnia, depression, anxiety, and suicidal ideation.      Objective:  BP 108/70   Pulse 76   Temp 98 F (36.7 C)   Resp 16   Ht '5\' 6"'$  (1.676 m)   Wt 165 lb (74.8 kg)   SpO2 97%   BMI 26.63 kg/m   BP Readings from Last 3 Encounters:  09/02/22 108/70  04/01/22 128/74  12/17/21 120/72    Wt Readings from Last 3 Encounters:  09/02/22 165 lb (74.8 kg)  04/01/22 173 lb 12.8 oz (78.8 kg)  12/17/21 177 lb (  80.3 kg)    Physical Exam  Lab Results  Component Value Date   HGBA1C 5.8 04/01/2022   HGBA1C 5.9 09/24/2021   HGBA1C 5.7 03/31/2021    Lab Results  Component Value Date   CREATININE 0.95 04/01/2022   CREATININE 0.89 09/24/2021   CREATININE 0.92 03/31/2021    Lab Results  Component Value Date   WBC 6.8 04/01/2022   HGB 13.2 04/01/2022   HCT 39.6 04/01/2022   PLT 317.0 04/01/2022   GLUCOSE 88 04/01/2022   CHOL 201 (H) 04/01/2022   TRIG 141.0 04/01/2022   HDL 56.50 04/01/2022   LDLDIRECT 140.0 04/01/2022   LDLCALC 116 (H) 04/01/2022   ALT 21 04/01/2022   AST 20 04/01/2022   NA 138 04/01/2022   K 4.2 04/01/2022   CL 102 04/01/2022   CREATININE 0.95 04/01/2022    BUN 19 04/01/2022   CO2 27 04/01/2022   TSH 1.10 05/17/2022   HGBA1C 5.8 04/01/2022   MICROALBUR <0.7 04/01/2022    MM Outside Films Mammo  Result Date: 09/28/2021 This examination belongs to an outside facility and is stored here for comparison purposes only.  Contact the originating outside institution for any associated report or interpretation.  MM Outside Films Mammo  Result Date: 09/28/2021 This examination belongs to an outside facility and is stored here for comparison purposes only.  Contact the originating outside institution for any associated report or interpretation.  MM Outside Films Mammo  Result Date: 09/28/2021 This examination belongs to an outside facility and is stored here for comparison purposes only.  Contact the originating outside institution for any associated report or interpretation.   Assessment & Plan:  .There are no diagnoses linked to this encounter.   I provided 30 minutes of face-to-face time during this encounter reviewing patient's last visit with me, patient's  most recent visit with cardiology,  nephrology,  and neurology,  recent surgical and non surgical procedures, previous  labs and imaging studies, counseling on currently addressed issues,  and post visit ordering to diagnostics and therapeutics .   Follow-up: No follow-ups on file.   Crecencio Mc, MD

## 2022-09-02 NOTE — Patient Instructions (Signed)
[email or web address]   This website  offers discounts for ozempic.

## 2022-09-03 LAB — LIPID PANEL W/REFLEX DIRECT LDL
Cholesterol: 221 mg/dL — ABNORMAL HIGH (ref <200)
HDL: 74 mg/dL (ref >=50)
LDL Cholesterol (Calc): 122 mg/dL (calc) — ABNORMAL HIGH
Non-HDL Cholesterol (Calc): 147 mg/dL (calc) — ABNORMAL HIGH (ref <130)
Total CHOL/HDL Ratio: 3 (calc) (ref <5.0)
Triglycerides: 134 mg/dL (ref <150)

## 2022-09-04 NOTE — Assessment & Plan Note (Signed)
She had trouble suppressing appetite and maintaining the weight loss she achieved after stopping the [place] , but has done well using GLP1 agonist and is willing to pay out of ppocket

## 2022-09-04 NOTE — Assessment & Plan Note (Signed)
Doing quite well on Restoril. We will continue this medication. Refills provided.

## 2022-09-27 ENCOUNTER — Encounter: Payer: Self-pay | Admitting: Internal Medicine

## 2022-09-29 ENCOUNTER — Other Ambulatory Visit: Payer: Self-pay | Admitting: Family

## 2022-09-29 ENCOUNTER — Ambulatory Visit
Admission: RE | Admit: 2022-09-29 | Discharge: 2022-09-29 | Disposition: A | Discharge status: Home / Self Care | Payer: Medicare Other | Source: Ambulatory Visit | Attending: Internal Medicine | Admitting: Internal Medicine

## 2022-09-29 DIAGNOSIS — Z1231 Encounter for screening mammogram for malignant neoplasm of breast: Secondary | ICD-10-CM | POA: Diagnosis present

## 2022-09-29 MED ORDER — ZEPBOUND 2.5 MG/0.5ML ~~LOC~~ SOAJ: 2.5000 mg | SUBCUTANEOUS | 0 refills | Status: DC

## 2022-10-19 ENCOUNTER — Encounter: Payer: Self-pay | Admitting: Internal Medicine

## 2022-11-28 ENCOUNTER — Other Ambulatory Visit: Payer: Self-pay | Admitting: Internal Medicine

## 2022-12-29 ENCOUNTER — Encounter: Payer: Self-pay | Admitting: Internal Medicine

## 2022-12-30 ENCOUNTER — Ambulatory Visit: Payer: PRIVATE HEALTH INSURANCE | Admitting: Internal Medicine

## 2023-01-02 MED ORDER — ONDANSETRON HCL 4 MG PO TABS: 4.0000 mg | ORAL_TABLET | Freq: Three times a day (TID) | ORAL | 2 refills | Status: DC | PRN

## 2023-01-03 NOTE — Telephone Encounter (Signed)
LMTCB. Please schedule pt when she returns call.

## 2023-01-04 ENCOUNTER — Ambulatory Visit (INDEPENDENT_AMBULATORY_CARE_PROVIDER_SITE_OTHER): Payer: Medicare Other | Admitting: Internal Medicine

## 2023-01-04 ENCOUNTER — Encounter: Payer: Self-pay | Admitting: Internal Medicine

## 2023-01-04 VITALS — BP 124/72 | HR 64 | Temp 98.2°F | Ht 66.0 in | Wt 170.6 lb

## 2023-01-04 DIAGNOSIS — E032 Hypothyroidism due to medicaments and other exogenous substances: Secondary | ICD-10-CM | POA: Diagnosis not present

## 2023-01-04 DIAGNOSIS — F40243 Fear of flying: Secondary | ICD-10-CM

## 2023-01-04 DIAGNOSIS — R7303 Prediabetes: Secondary | ICD-10-CM

## 2023-01-04 DIAGNOSIS — E782 Mixed hyperlipidemia: Secondary | ICD-10-CM

## 2023-01-04 DIAGNOSIS — Z85850 Personal history of malignant neoplasm of thyroid: Secondary | ICD-10-CM

## 2023-01-04 LAB — COMPREHENSIVE METABOLIC PANEL
ALT: 18 U/L (ref 0–35)
AST: 19 U/L (ref 0–37)
Albumin: 4.4 g/dL (ref 3.5–5.2)
Alkaline Phosphatase: 68 U/L (ref 39–117)
BUN: 14 mg/dL (ref 6–23)
CO2: 29 mEq/L (ref 19–32)
Calcium: 8.7 mg/dL (ref 8.4–10.5)
Chloride: 100 mEq/L (ref 96–112)
Creatinine, Ser: 0.82 mg/dL (ref 0.40–1.20)
GFR: 74.88 mL/min (ref >60.00)
Glucose, Bld: 104 mg/dL — ABNORMAL HIGH (ref 70–99)
Potassium: 4.1 mEq/L (ref 3.5–5.1)
Sodium: 136 mEq/L (ref 135–145)
Total Bilirubin: 0.3 mg/dL (ref 0.2–1.2)
Total Protein: 7.1 g/dL (ref 6.0–8.3)

## 2023-01-04 LAB — LIPID PANEL
Cholesterol: 176 mg/dL (ref 0–200)
HDL: 62 mg/dL (ref >39.00)
LDL Cholesterol: 82 mg/dL (ref 0–99)
NonHDL: 113.63
Total CHOL/HDL Ratio: 3
Triglycerides: 156 mg/dL — ABNORMAL HIGH (ref 0.0–149.0)
VLDL: 31.2 mg/dL (ref 0.0–40.0)

## 2023-01-04 LAB — TSH: TSH: 0.71 u[IU]/mL (ref 0.35–5.50)

## 2023-01-04 MED ORDER — CYANOCOBALAMIN 1000 MCG/ML IJ SOLN: 1000.0000 ug | INTRAMUSCULAR | 2 refills | Status: DC

## 2023-01-04 MED ORDER — ALPRAZOLAM 0.25 MG PO TABS: 0.2500 mg | ORAL_TABLET | Freq: Two times a day (BID) | ORAL | 0 refills | Status: DC | PRN

## 2023-01-04 NOTE — Telephone Encounter (Signed)
Pt is being seen this morning. 

## 2023-01-04 NOTE — Progress Notes (Unsigned)
Subjective:  Patient ID: Emily Arellano, female    DOB: Jun 17, 1956  Age: 67 y.o. MRN: [id number]  CC: The primary encounter diagnosis was H/O malignant neoplasm of thyroid. Diagnoses of Moderate mixed hyperlipidemia not requiring statin therapy, Iatrogenic hypothyroidism, Prediabetes, and Fear of flying were also pertinent to this visit.   HPI [name] presents for  Chief Complaint  Patient presents with   Medical Management of Chronic Issues    6 month follow up     1) Fear of flying:  flying to [place] with son.daughter and 4 grandchildren  in a few days.  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  Last tsh 0.59  Outpatient Medications Prior to Visit  Medication Sig Dispense Refill   Cholecalciferol (VITAMIN D3) 2000 UNITS capsule Take by mouth.     cyclobenzaprine (FLEXERIL) 10 MG tablet Take 1 tablet (10 mg total) by mouth 3 (three) times daily. 270 tablet 1   fluticasone (FLONASE) 50 MCG/ACT nasal spray USE ONE SPRAY INTO BOTH NOSTRILS EVERY DAY 16 g 2   levothyroxine (SYNTHROID) 88 MCG tablet TAKE 1 TABLET EVERY DAY ON EMPTY STOMACHWITH A GLASS OF WATER AT LEAST 30-60 MINBEFORE BREAKFAST 90 tablet 3   MAGNESIUM PO Take 3-4 tablets by mouth at bedtime.     ondansetron (ZOFRAN) 4 MG tablet Take 1 tablet (4 mg total) by mouth every 8 (eight) hours as needed for nausea or vomiting. 30 tablet 2   pantoprazole (PROTONIX) 40 MG tablet TAKE 1 TABLET BY MOUTH ONCE DAILY 90 tablet 3   temazepam (RESTORIL) 30 MG capsule TAKE 1 CAPSULE BY MOUTH AT BEDTIME AS NEEDED FOR SLEEP 30 capsule 5   triamcinolone cream (KENALOG) 0.1 % Apply 1 Application topically 2 (two) times daily. Friday, Saturday and Sunday only. Avoid face, groin and underarms. 45 g 0   cyanocobalamin (VITAMIN B12) 1000 MCG/ML injection  Inject 1 mL (1,000 mcg total) into the muscle once a week. 4 mL 2   tirzepatide (ZEPBOUND) 2.5 MG/0.5ML Pen Inject 2.5 mg into the skin once a week. (Patient not taking: Reported on 01/04/2023) 2 mL 0   No facility-administered medications prior to visit.    Review of Systems;  Patient denies headache, fevers, malaise, unintentional weight loss, skin rash, eye pain, sinus congestion and sinus pain, sore throat, dysphagia,  hemoptysis , cough, dyspnea, wheezing, chest pain, palpitations, orthopnea, edema, abdominal pain, nausea, melena, diarrhea, constipation, flank pain, dysuria, hematuria, urinary  Frequency, nocturia, numbness, tingling, seizures,  Focal weakness, Loss of consciousness,  Tremor, insomnia, depression, anxiety, and suicidal ideation.      Objective:  BP 124/72   Pulse 64   Temp 98.2 F (36.8 C) (Oral)   Ht 5\' 6"  (1.676 m)   Wt 170 lb 9.6 oz (77.4 kg)   SpO2 97%   BMI 27.54 kg/m   BP Readings from Last 3 Encounters:  01/04/23 124/72  09/02/22 108/70  04/01/22 128/74    Wt Readings from Last 3 Encounters:  01/04/23 170 lb 9.6 oz (77.4 kg)  09/02/22 165 lb (74.8 kg)  04/01/22 173 lb 12.8 oz (78.8 kg)    Physical Exam Vitals reviewed.  Constitutional:      General: She is not in acute distress.    Appearance: Normal  appearance. She is normal weight. She is not ill-appearing, toxic-appearing or diaphoretic.  HENT:     Head: Normocephalic.  Eyes:     General: No scleral icterus.       Right eye: No discharge.        Left eye: No discharge.     Conjunctiva/sclera: Conjunctivae normal.  Cardiovascular:     Rate and Rhythm: Normal rate and regular rhythm.     Heart sounds: Normal heart sounds.  Pulmonary:     Effort: Pulmonary effort is normal. No respiratory distress.     Breath sounds: Normal breath sounds.  Musculoskeletal:        General: Normal range of motion.  Skin:    General: Skin is warm and dry.  Neurological:     General: No focal deficit  present.     Mental Status: She is alert and oriented to person, place, and time. Mental status is at baseline.     Motor: Tremor present.     Comments: Fine tremor of outstretched hands   Psychiatric:        Mood and Affect: Mood normal.        Behavior: Behavior normal.        Thought Content: Thought content normal.        Judgment: Judgment normal.    Lab Results  Component Value Date   HGBA1C 5.7 09/02/2022   HGBA1C 5.8 04/01/2022   HGBA1C 5.9 09/24/2021    Lab Results  Component Value Date   CREATININE 0.82 01/04/2023   CREATININE 0.90 09/02/2022   CREATININE 0.95 04/01/2022    Lab Results  Component Value Date   WBC 6.8 04/01/2022   HGB 13.2 04/01/2022   HCT 39.6 04/01/2022   PLT 317.0 04/01/2022   GLUCOSE 104 (H) 01/04/2023   CHOL 176 01/04/2023   TRIG 156.0 (H) 01/04/2023   HDL 62.00 01/04/2023   LDLDIRECT 140.0 04/01/2022   LDLCALC 82 01/04/2023   ALT 18 01/04/2023   AST 19 01/04/2023   NA 136 01/04/2023   K 4.1 01/04/2023   CL 100 01/04/2023   CREATININE 0.82 01/04/2023   BUN 14 01/04/2023   CO2 29 01/04/2023   TSH 0.71 01/04/2023   HGBA1C 5.7 09/02/2022   MICROALBUR <0.7 04/01/2022    MM 3D SCREEN BREAST BILATERAL  Result Date: 09/30/2022 CLINICAL DATA:  Screening. EXAM: DIGITAL SCREENING BILATERAL MAMMOGRAM WITH TOMOSYNTHESIS AND CAD TECHNIQUE: Bilateral screening digital craniocaudal and mediolateral oblique mammograms were obtained. Bilateral screening digital breast tomosynthesis was performed. The images were evaluated with computer-aided detection. COMPARISON:  Previous exam(s). ACR Breast Density Category b: There are scattered areas of fibroglandular density. FINDINGS: There are no findings suspicious for malignancy. IMPRESSION: No mammographic evidence of malignancy. A result letter of this screening mammogram will be mailed directly to the patient. RECOMMENDATION: Screening mammogram in one year. (Code:SM-B-01Y) BI-RADS CATEGORY  1: Negative.  Electronically Signed   By: Elberta Fortis M.D.   On: 09/30/2022 15:37    Assessment & Plan:  .H/O malignant neoplasm of thyroid  Moderate mixed hyperlipidemia not requiring statin therapy -     Lipid panel -     Comprehensive metabolic panel  Iatrogenic hypothyroidism Assessment & Plan: Checking thyroid today given tremor on exam and recent palpitations.  Will lower cumulative dose to  t0 572 mcg weekly  for TSH < 1 . New regimen is 88 mcg daily x 6, 44 mcg (1/2 tablet) on Day 7    Lab Results  Component Value Date   TSH 0.71 01/04/2023     Orders: -     TSH  Prediabetes Assessment & Plan: A1c is improving with increased activity and dietary restrictions .  She tolerated Ozempic and appreciated a 12 lb weight loss during the last 2-3 months before stopping due to [facility] cost.     Lab Results  Component Value Date   HGBA1C 5.7 09/02/2022      Fear of flying Assessment & Plan: Prescribing alprazolam for upcoming flight.    Other orders -     Cyanocobalamin; Inject 1 mL (1,000 mcg total) into the muscle once a week.  Dispense: 4 mL; Refill: 2 -     ALPRAZolam; Take 1 tablet (0.25 mg total) by mouth 2 (two) times daily as needed for anxiety.  Dispense: 20 tablet; Refill: 0   Follow-up: No follow-ups on file.   Sherlene Shams, MD

## 2023-01-05 DIAGNOSIS — F40243 Fear of flying: Secondary | ICD-10-CM | POA: Insufficient documentation

## 2023-01-05 NOTE — Assessment & Plan Note (Signed)
Prescribing alprazolam for upcoming flight.

## 2023-01-05 NOTE — Assessment & Plan Note (Signed)
A1c is improving with increased activity and dietary restrictions .  She tolerated Ozempic and appreciated a 12 lb weight loss during the last 2-3 months before stopping due to [facility] cost.     Lab Results  Component Value Date   HGBA1C 5.7 09/02/2022

## 2023-01-05 NOTE — Assessment & Plan Note (Addendum)
Checking thyroid today given tremor on exam and recent palpitations.  Will lower cumulative dose to  t0 572 mcg weekly  for TSH < 1 . New regimen is 88 mcg daily x 6, 44 mcg (1/2 tablet) on Day 7    Lab Results  Component Value Date   TSH 0.71 01/04/2023

## 2023-02-03 ENCOUNTER — Ambulatory Visit: Payer: PRIVATE HEALTH INSURANCE | Admitting: Internal Medicine

## 2023-02-08 ENCOUNTER — Encounter (INDEPENDENT_AMBULATORY_CARE_PROVIDER_SITE_OTHER): Payer: Self-pay

## 2023-02-14 ENCOUNTER — Other Ambulatory Visit: Payer: Self-pay | Admitting: Internal Medicine

## 2023-03-02 ENCOUNTER — Ambulatory Visit: Payer: PRIVATE HEALTH INSURANCE | Admitting: Internal Medicine

## 2023-03-21 ENCOUNTER — Other Ambulatory Visit: Payer: Self-pay | Admitting: Internal Medicine

## 2023-03-28 ENCOUNTER — Telehealth: Payer: Self-pay | Admitting: Internal Medicine

## 2023-03-28 NOTE — Telephone Encounter (Signed)
Copied from CRM [id number]. Topic: Medicare AWV >> Mar 28, 2023  2:23 PM Payton Doughty wrote: Reason for CRM: LM 03/28/2023 to schedule AWV   Verlee Rossetti; Care Guide Ambulatory Clinical Support [place] [facility] Direct Dial: [phone]

## 2023-04-03 ENCOUNTER — Ambulatory Visit (INDEPENDENT_AMBULATORY_CARE_PROVIDER_SITE_OTHER): Payer: Medicare Other

## 2023-04-03 VITALS — Ht 66.0 in | Wt 164.0 lb

## 2023-04-03 DIAGNOSIS — Z0000 Encounter for general adult medical examination without abnormal findings: Secondary | ICD-10-CM | POA: Diagnosis not present

## 2023-04-03 NOTE — Patient Instructions (Signed)
Emily Arellano , Thank you for taking time to come for your Medicare Wellness Visit. I appreciate your ongoing commitment to your health goals. Please review the following plan we discussed and let me know if I can assist you in the future.   Referrals/Orders/Follow-Ups/Clinician Recommendations: None  This is a list of the screening recommended for you and due dates:  Health Maintenance  Topic Date Due   COVID-19 Vaccine (1) Never done   Zoster (Shingles) Vaccine (1 of 2) Never done   Flu Shot  Never done   Pneumonia Vaccine (1 of 1 - PCV) 09/03/2023*   Mammogram  09/29/2023   Medicare Annual Wellness Visit  04/02/2024   Cologuard (Stool DNA test)  04/18/2025   DTaP/Tdap/Td vaccine (2 - Td or Tdap) 10/15/2026   DEXA scan (bone density measurement)  Completed   Hepatitis C Screening  Completed   HPV Vaccine  Aged Out   Colon Cancer Screening  Discontinued  *Topic was postponed. The date shown is not the original due date.    Advanced directives: (Copy Requested) Please bring a copy of your health care power of attorney and living will to the office to be added to your chart at your convenience.  Next Medicare Annual Wellness Visit scheduled for next year: Yes 04/05/24 @ 8:15

## 2023-04-03 NOTE — Progress Notes (Signed)
Subjective:   Emily Arellano is a 67 y.o. female who presents for an Initial Medicare Annual Wellness Visit.  Visit Complete: Virtual  I connected with  Emily Arellano on 04/03/23 by a audio enabled telemedicine application and verified that I am speaking with the correct person using two identifiers.  Patient Location: Other:  work  Restaurant manager, fast food Location: Office/Clinic  I discussed the limitations of evaluation and management by telemedicine. The patient expressed understanding and agreed to proceed.  Vital Signs: Unable to obtain new vitals due to this being a telehealth visit.  Cardiac Risk Factors include: advanced age (>53men, >25 women);dyslipidemia     Objective:    Today's Vitals   04/03/23 0820  Weight: 164 lb (74.4 kg)  Height: 5\' 6"  (1.676 m)   Body mass index is 26.47 kg/m.     04/03/2023    8:27 AM 11/20/2015   12:58 PM 01/28/2015   10:43 AM 02/24/2012    6:07 PM 02/16/2012    1:10 PM  Advanced Directives  Does Patient Have a Medical Advance Directive? Yes No Yes Patient has advance directive, copy not in chart Patient has advance directive, copy not in chart  Type of Advance Directive Healthcare Power of [place];Living will  Healthcare Power of [place];Living will Healthcare Power of [place];Living will Healthcare Power of [place];Living will  Copy of Healthcare Power of Attorney in Chart? No - copy requested   Copy requested from other (Comment)     Current Medications (verified) Outpatient Encounter Medications as of 04/03/2023  Medication Sig   Cholecalciferol (VITAMIN D3) 2000 UNITS capsule Take by mouth.   cyanocobalamin (VITAMIN B12) 1000 MCG/ML injection Inject 1 mL (1,000 mcg total) into the muscle once a week.   cyclobenzaprine (FLEXERIL) 10 MG tablet Take 1 tablet (10 mg total) by mouth 3 (three) times daily.   fluticasone (FLONASE) 50 MCG/ACT nasal spray USE ONE SPRAY INTO BOTH NOSTRILS EVERY DAY   levothyroxine (SYNTHROID) 88 MCG tablet TAKE 1  TABLET EVERY DAY ON EMPTY STOMACHWITH A GLASS OF WATER AT LEAST 30-60 MINBEFORE BREAKFAST   MAGNESIUM PO Take 3-4 tablets by mouth at bedtime.   ondansetron (ZOFRAN) 4 MG tablet Take 1 tablet (4 mg total) by mouth every 8 (eight) hours as needed for nausea or vomiting.   pantoprazole (PROTONIX) 40 MG tablet TAKE 1 TABLET BY MOUTH ONCE DAILY   temazepam (RESTORIL) 30 MG capsule TAKE 1 CAPSULE BY MOUTH NIGHTLY AS NEEDED FOR SLEEP SLEEP   triamcinolone cream (KENALOG) 0.1 % Apply 1 Application topically 2 (two) times daily. Friday, Saturday and Sunday only. Avoid face, groin and underarms.   ALPRAZolam (XANAX) 0.25 MG tablet Take 1 tablet (0.25 mg total) by mouth 2 (two) times daily as needed for anxiety. (Patient not taking: Reported on 04/03/2023)   No facility-administered encounter medications on file as of 04/03/2023.    Allergies (verified) Sulfa antibiotics, Hydrocodone, Codeine, and Sulfamethoxazole-trimethoprim   History: Past Medical History:  Diagnosis Date   Anxiety    "xanax prn"   Heart murmur    Hypothyroidism    Osteoarthritis    Thyroid cancer [facility])    Past Surgical History:  Procedure Laterality Date   ABDOMINAL HYSTERECTOMY     APPENDECTOMY  ~ 2005   "I think; when they got my gallbladder"   BREAST SURGERY  07/11/2014   reduction    CHOLECYSTECTOMY  ~ 2005   HARVEST BONE GRAFT  02/23/2012   Procedure: HARVEST ILIAC BONE GRAFT;  Surgeon: Chrissie Noa  Amanda Pea, MD;  Location: [facility] OR;  Service: Orthopedics;  Laterality: N/A;  With Iliac Crest and BMP Bone Graft Fixation As Necessary Radial Nerve Release (Neurolysis as Necessary)    ORIF HUMERUS FRACTURE  02/23/2012   Procedure: OPEN REDUCTION INTERNAL FIXATION (ORIF) DISTAL HUMERUS FRACTURE;  Surgeon: Dominica Severin, MD;  Location: [facility] OR;  Service: Orthopedics;  Laterality: Left;  Open Reduction Internal Fixation Left Humerus Non Union   REDUCTION MAMMAPLASTY Bilateral    TENDON REPAIR  10/10/2011   "posterior; left foot"    TONSILLECTOMY     TOTAL THYROIDECTOMY  07/12/1991    and        radiation   Family History  Problem Relation Age of Onset   Dementia Mother    Hypertension Mother    Diabetes Mother    Cancer Father        lung   Cancer Sister        bladder   Social History   Socioeconomic History   Marital status: Married    Spouse name: Loraine Leriche   Number of children: 2   Years of education: 14   Highest education level: Not on file  Occupational History   Occupation: Scientist, 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 Married. Has two sons, 35 and 30. Has 3 grandchildren.      Work - Management consultant Group, Owns Pharmacist, community      Exercise limited by pain.   Social Determinants of Health   Financial Resource Strain: Low Risk  (04/03/2023)   Overall Financial Resource Strain (CARDIA)    Difficulty of Paying Living Expenses: Not hard at all  Food Insecurity: No Food Insecurity (04/03/2023)   Hunger Vital Sign    Worried About Running Out of Food in the Last Year: Never true    Ran Out of Food in the Last Year: Never true  Transportation Needs: No Transportation Needs (04/03/2023)   PRAPARE - Administrator, Civil Service (Medical): No    Lack of Transportation (Non-Medical): No  Physical Activity: Sufficiently Active (04/03/2023)   Exercise Vital Sign    Days of Exercise per Week: 5 days    Minutes of Exercise per Session: 30 min  Stress: No Stress Concern Present (04/03/2023)   [employer] of Occupational Health - Occupational Stress Questionnaire    Feeling of Stress : Not at all  Social Connections: Socially Integrated (04/03/2023)   Social Connection and Isolation Panel [NHANES]    Frequency of Communication with Friends and Family:  More than three times a week    Frequency of Social Gatherings with Friends and Family: More than three times a week    Attends Religious Services: More than 4 times per year    Active Member of [employer] or Organizations: Yes    Attends Engineer, structural: More than 4 times per year    Marital Status: Married    Tobacco Counseling Counseling given: Not Answered   Clinical Intake:  Pre-visit preparation completed: Yes  Pain : No/denies pain     BMI - recorded: 26.47 Nutritional Status: BMI 25 -29 Overweight Nutritional Risks: None Diabetes: No  How often do you need to have someone  help you when you read instructions, pamphlets, or other written materials from your doctor or pharmacy?: 1 - Never  Interpreter Needed?: No  Information entered by :: R. Haston Casebolt LPN   Activities of Daily Living    04/03/2023    8:21 AM  In your present state of health, do you have any difficulty performing the following activities:  Hearing? 0  Vision? 0  Comment readers  Difficulty concentrating or making decisions? 0  Walking or climbing stairs? 0  Dressing or bathing? 0  Doing errands, shopping? 0  Preparing Food and eating ? N  Using the Toilet? N  In the past six months, have you accidently leaked urine? N  Do you have problems with loss of bowel control? N  Managing your Medications? N  Managing your Finances? N  Housekeeping or managing your Housekeeping? N    Patient Care Team: Sherlene Shams, MD as PCP - General (Internal Medicine)  Indicate any recent Medical Services you may have received from other than [facility] providers in the past year (date may be approximate).     Assessment:   This is a routine wellness examination for Emily Arellano.  Hearing/Vision screen Hearing Screening - Comments:: No issues Vision Screening - Comments:: readers   Goals Addressed             This Visit's Progress    Patient Stated       Working on weight and lose some        Depression Screen    04/03/2023    8:24 AM 01/04/2023    9:10 AM 09/02/2022    8:11 AM 04/01/2022    8:30 AM 03/31/2022    4:49 PM 12/17/2021    8:30 AM 09/24/2021    8:27 AM  PHQ 2/9 Scores  PHQ - 2 Score 0  0 0 0 0 0  PHQ- 9 Score 0      0  Exception Documentation  Patient refusal         Fall Risk    04/03/2023    8:22 AM 01/04/2023    9:10 AM 09/02/2022    8:10 AM 03/31/2022    4:48 PM 12/17/2021    8:29 AM  Fall Risk   Falls in the past year? 0 0 0 0 0  Number falls in past yr: 0 0 0  0  Injury with Fall? 0 0 0  0  Risk for fall due to : No Fall Risks No Fall Risks No Fall Risks No Fall Risks No Fall Risks  Follow up Falls prevention discussed;Falls evaluation completed Falls evaluation completed Falls evaluation completed Falls evaluation completed Falls evaluation completed    MEDICARE RISK AT HOME: Medicare Risk at Home Any stairs in or around the home?: Yes If so, are there any without handrails?: No Home free of loose throw rugs in walkways, pet beds, electrical cords, etc?: Yes Adequate lighting in your home to reduce risk of falls?: Yes Life alert?: No Use of a cane, walker or w/c?: No Grab bars in the bathroom?: No Shower chair or bench in shower?: Yes Elevated toilet seat or a handicapped toilet?: Yes   Cognitive Function:    04/01/2022    8:20 AM  MMSE - Mini Mental State Exam  Orientation to time 5  Orientation to Place 5  Registration 3  Attention/ Calculation 5  Recall 3  Language- name 2 objects 2  Language- repeat 1  Language- follow 3 step command 3  Language-  read & follow direction 1  Write a sentence 1  Copy design 1  Total score 30        04/03/2023    8:27 AM  6CIT Screen  What Year? 0 points  What month? 0 points  What time? 0 points  Count back from 20 0 points  Months in reverse 0 points  Repeat phrase 0 points  Total Score 0 points    Immunizations Immunization History  Administered Date(s) Administered   Tdap 10/14/2016     TDAP status: Up to date  Flu Vaccine status. Declined. Education has been provided.  Pneumococcal vaccine status: Declined,  Education has been provided regarding the importance of this vaccine but patient still declined. Advised may receive this vaccine at local pharmacy or Health Dept. Aware to provide a copy of the vaccination record if obtained from local pharmacy or Health Dept. Verbalized acceptance and understanding.   Covid-19 vaccine status: Declined, Education has been provided regarding the importance of this vaccine but patient still declined. Advised may receive this vaccine at local pharmacy or Health Dept.or vaccine clinic. Aware to provide a copy of the vaccination record if obtained from local pharmacy or Health Dept. Verbalized acceptance and understanding.  Qualifies for Shingles Vaccine? Yes   Zostavax completed No   Shingrix Completed?: No.    Education has been provided regarding the importance of this vaccine. Patient has been advised to call insurance company to determine out of pocket expense if they have not yet received this vaccine. Advised may also receive vaccine at local pharmacy or Health Dept. Verbalized acceptance and understanding.  Screening Tests Health Maintenance  Topic Date Due   COVID-19 Vaccine (1) Never done   Zoster Vaccines- Shingrix (1 of 2) Never done   INFLUENZA VACCINE  Never done   Medicare Annual Wellness (AWV)  04/02/2023   Pneumonia Vaccine 66+ Years old (1 of 1 - PCV) 09/03/2023 (Originally 03/29/2022)   MAMMOGRAM  09/29/2023   Fecal DNA (Cologuard)  04/18/2025   DTaP/Tdap/Td (2 - Td or Tdap) 10/15/2026   DEXA SCAN  Completed   Hepatitis C Screening  Completed   HPV VACCINES  Aged Out   Colonoscopy  Discontinued    Health Maintenance  Health Maintenance Due  Topic Date Due   COVID-19 Vaccine (1) Never done   Zoster Vaccines- Shingrix (1 of 2) Never done   INFLUENZA VACCINE  Never done   Medicare Annual Wellness (AWV)   04/02/2023    Colorectal cancer screening: Type of screening: Cologuard. Completed 04/2022. Repeat every 3 years  Mammogram status: Completed 09/2022. Repeat every year  Bone Density status: Completed 11/2023. Results reflect: Bone density results: OSTEOPENIA. Repeat every 2 years. Patient wants to discuss with PCP at upcoming appointment  Lung Cancer Screening: (Low Dose CT Chest recommended if Age 22-80 years, 20 pack-year currently smoking OR have quit w/in 15years.) does not qualify.    Additional Screening:  Hepatitis C Screening: does qualify; Completed 10/2015  Vision Screening: Recommended annual ophthalmology exams for early detection of glaucoma and other disorders of the eye. Is the patient up to date with their annual eye exam?  Yes  Who is the provider or what is the name of the office in which the patient attends annual eye exams? [facility]  If pt is not established with a provider, would they like to be referred to a provider to establish care? No .   Dental Screening: Recommended annual dental exams for proper oral hygiene  Community Resource Referral / Chronic Care Management: CRR required this visit?  No   CCM required this visit?  No     Plan:     I have personally reviewed and noted the following in the patient's chart:   Medical and social history Use of alcohol, tobacco or illicit drugs  Current medications and supplements including opioid prescriptions. Patient is not currently taking opioid prescriptions. Functional ability and status Nutritional status Physical activity Advanced directives List of other physicians Hospitalizations, surgeries, and ER visits in previous 12 months Vitals Screenings to include cognitive, depression, and falls Referrals and appointments  In addition, I have reviewed and discussed with patient certain preventive protocols, quality metrics, and best practice recommendations. A written personalized care plan for  preventive services as well as general preventive health recommendations were provided to patient.     Sydell Axon, LPN   08/24/863   After Visit Summary: (MyChart) Due to this being a telephonic visit, the after visit summary with patients personalized plan was offered to patient via MyChart   Nurse Notes: None

## 2023-04-07 ENCOUNTER — Ambulatory Visit: Payer: PRIVATE HEALTH INSURANCE | Admitting: Internal Medicine

## 2023-04-15 ENCOUNTER — Other Ambulatory Visit: Payer: Self-pay | Admitting: Internal Medicine

## 2023-04-20 ENCOUNTER — Other Ambulatory Visit: Payer: Self-pay | Admitting: Family

## 2023-04-21 ENCOUNTER — Ambulatory Visit: Payer: PRIVATE HEALTH INSURANCE | Admitting: Internal Medicine

## 2023-04-21 ENCOUNTER — Other Ambulatory Visit: Payer: Self-pay | Admitting: Internal Medicine

## 2023-04-24 MED ORDER — TEMAZEPAM 30 MG PO CAPS: ORAL_CAPSULE | ORAL | 5 refills | Status: DC

## 2023-04-24 NOTE — Telephone Encounter (Signed)
Patient called and is out of her temazepam (RESTORIL) 30 MG capsule. [facility]acy is [facility]

## 2023-05-03 ENCOUNTER — Ambulatory Visit: Payer: Medicare Other | Admitting: Internal Medicine

## 2023-05-03 ENCOUNTER — Encounter: Payer: Self-pay | Admitting: Internal Medicine

## 2023-05-03 VITALS — BP 116/60 | HR 74 | Ht 66.0 in | Wt 170.0 lb

## 2023-05-03 DIAGNOSIS — F411 Generalized anxiety disorder: Secondary | ICD-10-CM

## 2023-05-03 DIAGNOSIS — E032 Hypothyroidism due to medicaments and other exogenous substances: Secondary | ICD-10-CM | POA: Diagnosis not present

## 2023-05-03 DIAGNOSIS — R7303 Prediabetes: Secondary | ICD-10-CM

## 2023-05-03 DIAGNOSIS — E7800 Pure hypercholesterolemia, unspecified: Secondary | ICD-10-CM | POA: Diagnosis not present

## 2023-05-03 DIAGNOSIS — Z85850 Personal history of malignant neoplasm of thyroid: Secondary | ICD-10-CM

## 2023-05-03 DIAGNOSIS — E663 Overweight: Secondary | ICD-10-CM

## 2023-05-03 MED ORDER — CYANOCOBALAMIN 1000 MCG/ML IJ SOLN: 1000.0000 ug | INTRAMUSCULAR | 2 refills | Status: DC

## 2023-05-03 MED ORDER — ZEPBOUND 15 MG/0.5ML ~~LOC~~ SOAJ: 15.0000 mg | SUBCUTANEOUS | 2 refills | Status: DC

## 2023-05-03 MED ORDER — ALBUTEROL SULFATE 0.63 MG/3ML IN NEBU: 1.0000 | INHALATION_SOLUTION | Freq: Four times a day (QID) | RESPIRATORY_TRACT | 12 refills | Status: AC | PRN

## 2023-05-03 NOTE — Patient Instructions (Addendum)
I am so sorry that your temazepam was not refilled on the day you called to request it.  Please remember that the turnaround time on mychart essages and refill requests may be up to 3 days  Here are some local and online Sources for zepbound and [employer] (direct pay)   [facility] CASH PAY FOR Emily Arellano, Emily Arellano - [phone]  Emily Arellano  Drug store in [place]  [facility] Emily Arellano Meds:  Online GLP   Try adding metformin to the zepbound

## 2023-05-03 NOTE — Progress Notes (Unsigned)
Subjective:  Patient ID: Emily Arellano, female    DOB: 07/17/1955  Age: 67 y.o. MRN: [id number]  CC: The primary encounter diagnosis was Prediabetes. Diagnoses of Pure hypercholesterolemia, Anxiety, generalized, Iatrogenic hypothyroidism, H/O malignant neoplasm of thyroid, and Overweight (BMI 25.0-29.9) were also pertinent to this visit.   HPI Emily Arellano presents for  Chief Complaint  Patient presents with   Medical Management of Chronic Issues   1) insomnia:  continues to require use of temazepam for induction and maintenance of sleep   2) left labral tear by recent MRI    managed with periodic corticosteroid injections.    3)  weight management  using 15 mg mounjaro and not losing weight   she is limited to Walking for exercise due to left hip pain  mail order     Outpatient Medications Prior to Visit  Medication Sig Dispense Refill   Cholecalciferol (VITAMIN D3) 2000 UNITS capsule Take by mouth.     cyclobenzaprine (FLEXERIL) 10 MG tablet Take 1 tablet (10 mg total) by mouth 3 (three) times daily. 270 tablet 1   fluticasone (FLONASE) 50 MCG/ACT nasal spray USE ONE SPRAY INTO BOTH NOSTRILS EVERY DAY 16 g 2   levothyroxine (SYNTHROID) 88 MCG tablet TAKE 1 TABLET EVERY DAY ON EMPTY STOMACHWITH A GLASS OF WATER AT LEAST 30-60 MINBEFORE BREAKFAST 90 tablet 3   MAGNESIUM PO Take 3-4 tablets by mouth at bedtime.     ondansetron (ZOFRAN) 4 MG tablet Take 1 tablet (4 mg total) by mouth every 8 (eight) hours as needed for nausea or vomiting. 30 tablet 2   pantoprazole (PROTONIX) 40 MG tablet TAKE 1 TABLET BY MOUTH ONCE DAILY 90 tablet 3   temazepam (RESTORIL) 30 MG capsule TAKE 1 CAPSULE BY MOUTH NIGHTLY AS NEEDED FOR SLEEP SLEEP 30 capsule 5   triamcinolone cream (KENALOG) 0.1 % Apply 1 Application topically 2 (two) times daily. Friday, Saturday and Sunday only. Avoid face, groin and underarms. 45 g 0   cyanocobalamin (VITAMIN B12) 1000 MCG/ML injection Inject 1 mL (1,000 mcg total)  into the muscle once a week. 4 mL 2   metFORMIN (GLUCOPHAGE-XR) 500 MG 24 hr tablet TAKE 1 TABLET BY MOUTH 2 TIMES DAILY. (Patient not taking: Reported on 05/03/2023) 60 tablet 0   No facility-administered medications prior to visit.    Review of Systems;  Patient denies headache, fevers, malaise, unintentional weight loss, skin rash, eye pain, sinus congestion and sinus pain, sore throat, dysphagia,  hemoptysis , cough, dyspnea, wheezing, chest pain, palpitations, orthopnea, edema, abdominal pain, nausea, melena, diarrhea, constipation, flank pain, dysuria, hematuria, urinary  Frequency, nocturia, numbness, tingling, seizures,  Focal weakness, Loss of consciousness,  Tremor, insomnia, depression, anxiety, and suicidal ideation.      Objective:  BP 116/60   Pulse 74   Ht 5\' 6"  (1.676 m)   Wt 170 lb (77.1 kg)   SpO2 97%   BMI 27.44 kg/m   BP Readings from Last 3 Encounters:  05/03/23 116/60  01/04/23 124/72  09/02/22 108/70    Wt Readings from Last 3 Encounters:  05/03/23 170 lb (77.1 kg)  04/03/23 164 lb (74.4 kg)  01/04/23 170 lb 9.6 oz (77.4 kg)    Physical Exam  Lab Results  Component Value Date   HGBA1C 5.7 09/02/2022   HGBA1C 5.8 04/01/2022   HGBA1C 5.9 09/24/2021    Lab Results  Component Value Date   CREATININE 0.82 01/04/2023   CREATININE 0.90 09/02/2022   CREATININE  0.95 04/01/2022    Lab Results  Component Value Date   WBC 6.8 04/01/2022   HGB 13.2 04/01/2022   HCT 39.6 04/01/2022   PLT 317.0 04/01/2022   GLUCOSE 104 (H) 01/04/2023   CHOL 176 01/04/2023   TRIG 156.0 (H) 01/04/2023   HDL 62.00 01/04/2023   LDLDIRECT 140.0 04/01/2022   LDLCALC 82 01/04/2023   ALT 18 01/04/2023   AST 19 01/04/2023   NA 136 01/04/2023   K 4.1 01/04/2023   CL 100 01/04/2023   CREATININE 0.82 01/04/2023   BUN 14 01/04/2023   CO2 29 01/04/2023   TSH 0.71 01/04/2023   HGBA1C 5.7 09/02/2022   MICROALBUR <0.7 04/01/2022    MM 3D SCREEN BREAST  BILATERAL  Result Date: 09/30/2022 CLINICAL DATA:  Screening. EXAM: DIGITAL SCREENING BILATERAL MAMMOGRAM WITH TOMOSYNTHESIS AND CAD TECHNIQUE: Bilateral screening digital craniocaudal and mediolateral oblique mammograms were obtained. Bilateral screening digital breast tomosynthesis was performed. The images were evaluated with computer-aided detection. COMPARISON:  Previous exam(s). ACR Breast Density Category b: There are scattered areas of fibroglandular density. FINDINGS: There are no findings suspicious for malignancy. IMPRESSION: No mammographic evidence of malignancy. A result letter of this screening mammogram will be mailed directly to the patient. RECOMMENDATION: Screening mammogram in one year. (Code:SM-B-01Y) BI-RADS CATEGORY  1: Negative. Electronically Signed   By: Elberta Fortis M.D.   On: 09/30/2022 15:37    Assessment & Plan:  .Prediabetes -     Comprehensive metabolic panel -     Hemoglobin A1c  Pure hypercholesterolemia -     CBC with Differential/Platelet -     Lipid panel -     LDL cholesterol, direct  Anxiety, generalized Assessment & Plan: Manifested primarily as insomnia and aggravated by concern for her young granddaughter who has leukemia .  Continue lexapro    Iatrogenic hypothyroidism Assessment & Plan: Managed with  88 mcg daily x 6, 44 mcg (1/2 tablet) on Day 7    Lab Results  Component Value Date   TSH 0.71 01/04/2023      H/O malignant neoplasm of thyroid  Overweight (BMI 25.0-29.9) Assessment & Plan:  She had trouble suppressing appetite and maintaining the weight loss she achieved after stopping the Optavia diet , but has done well using GLP1 agonist therapy since 2022 .  She has been receiving a compounded form of generic Zepbound  for the last several months and believes it may be placebo/ineffective .  Will change providers to [facility] in [place]    Other orders -     Cyanocobalamin; Inject 1 mL (1,000 mcg total) into the muscle once a week.   Dispense: 4 mL; Refill: 2 -     Albuterol Sulfate; Take 3 mLs (0.63 mg total) by nebulization every 6 (six) hours as needed for wheezing.  Dispense: 75 mL; Refill: 12     I provided 30 minutes of face-to-face time during this encounter reviewing patient's last visit with me, patient's  most recent visit with cardiology,  nephrology,  and neurology,  recent surgical and non surgical procedures, previous  labs and imaging studies, counseling on currently addressed issues,  and post visit ordering to diagnostics and therapeutics .   Follow-up: No follow-ups on file.   Sherlene Shams, MD

## 2023-05-04 ENCOUNTER — Encounter: Payer: PRIVATE HEALTH INSURANCE | Admitting: Dermatology

## 2023-05-04 ENCOUNTER — Encounter: Payer: Self-pay | Admitting: Internal Medicine

## 2023-05-04 NOTE — Assessment & Plan Note (Signed)
Manifested primarily as insomnia and aggravated by concern for her young granddaughter who has leukemia .  Continue lexapro

## 2023-05-04 NOTE — Assessment & Plan Note (Signed)
Managed with  88 mcg daily x 6, 44 mcg (1/2 tablet) on Day 7    Lab Results  Component Value Date   TSH 0.71 01/04/2023

## 2023-05-04 NOTE — Assessment & Plan Note (Signed)
She had trouble suppressing appetite and maintaining the weight loss she achieved after stopping the Optavia diet , but has done well using GLP1 agonist therapy since 2022 .  She has been receiving a compounded form of generic Zepbound  for the last several months and believes it may be placebo/ineffective .  Will change providers to [facility] in [place]

## 2023-05-05 ENCOUNTER — Other Ambulatory Visit: Payer: Self-pay | Admitting: Internal Medicine

## 2023-05-05 MED ORDER — ZEPBOUND 15 MG/0.5ML ~~LOC~~ SOAJ: 15.0000 mg | SUBCUTANEOUS | 2 refills | Status: DC

## 2023-05-19 ENCOUNTER — Other Ambulatory Visit (HOSPITAL_COMMUNITY): Payer: Self-pay

## 2023-05-19 ENCOUNTER — Telehealth: Payer: Self-pay

## 2023-05-19 NOTE — Telephone Encounter (Signed)
Pharmacy Patient Advocate Encounter   Received notification from Patient Pharmacy that prior authorization for Albuterol Sulfate 0.63mg /83ml is required/requested.   Insurance verification completed.   The patient is insured through [facility] .   Per test claim: PA required; PA submitted to above mentioned insurance via CoverMyMeds Key/confirmation #/EOC  [id number] Status is pending

## 2023-05-22 NOTE — Telephone Encounter (Signed)
Pharmacy Patient Advocate Encounter  Received notification from [facility] that Prior Authorization for Albuterol Sulfate 0.63mg /52ml  has been DENIED.  Full denial letter will be uploaded to the media tab. See denial reason below.   PA #/Case ID/Reference #: [id number]

## 2023-08-02 ENCOUNTER — Encounter: Payer: Self-pay | Admitting: Internal Medicine

## 2023-08-02 MED ORDER — TIRZEPATIDE-WEIGHT MANAGEMENT 5 MG/0.5ML ~~LOC~~ SOLN: 5.0000 mg | SUBCUTANEOUS | 2 refills | Status: DC

## 2023-08-03 DIAGNOSIS — M25552 Pain in left hip: Secondary | ICD-10-CM | POA: Diagnosis not present

## 2023-08-03 DIAGNOSIS — M7062 Trochanteric bursitis, left hip: Secondary | ICD-10-CM | POA: Diagnosis not present

## 2023-08-24 DIAGNOSIS — M25552 Pain in left hip: Secondary | ICD-10-CM | POA: Diagnosis not present

## 2023-08-31 DIAGNOSIS — M25552 Pain in left hip: Secondary | ICD-10-CM | POA: Diagnosis not present

## 2023-09-01 DIAGNOSIS — H2513 Age-related nuclear cataract, bilateral: Secondary | ICD-10-CM | POA: Diagnosis not present

## 2023-09-01 DIAGNOSIS — H35372 Puckering of macula, left eye: Secondary | ICD-10-CM | POA: Diagnosis not present

## 2023-09-01 DIAGNOSIS — H43812 Vitreous degeneration, left eye: Secondary | ICD-10-CM | POA: Diagnosis not present

## 2023-09-05 DIAGNOSIS — M25552 Pain in left hip: Secondary | ICD-10-CM | POA: Diagnosis not present

## 2023-09-07 DIAGNOSIS — M25552 Pain in left hip: Secondary | ICD-10-CM | POA: Diagnosis not present

## 2023-09-08 ENCOUNTER — Encounter: Payer: Self-pay | Admitting: Internal Medicine

## 2023-09-08 MED ORDER — TIRZEPATIDE-WEIGHT MANAGEMENT 5 MG/0.5ML ~~LOC~~ SOLN: 10.0000 mg | SUBCUTANEOUS | 2 refills | Status: DC

## 2023-09-08 MED ORDER — TIRZEPATIDE-WEIGHT MANAGEMENT 10 MG/0.5ML ~~LOC~~ SOAJ: 10.0000 mg | SUBCUTANEOUS | 2 refills | Status: DC

## 2023-09-11 DIAGNOSIS — M25552 Pain in left hip: Secondary | ICD-10-CM | POA: Diagnosis not present

## 2023-09-12 ENCOUNTER — Telehealth: Payer: Self-pay | Admitting: Internal Medicine

## 2023-09-12 ENCOUNTER — Telehealth: Payer: Self-pay

## 2023-09-12 NOTE — Telephone Encounter (Signed)
 Copied from CRM [id number]. Topic: Clinical - Prescription Issue >> Sep 12, 2023  4:07 PM Sonny Dandy B wrote: Reason for CRM: pt called to advise [place]. The wrong dosage was called in. Pt states it should be 10mg s, of Zepbound. Pt is requesting it be corrected and called in. Pt is requesting a call back [phone]

## 2023-09-12 NOTE — Telephone Encounter (Signed)
 error

## 2023-09-13 MED ORDER — TIRZEPATIDE-WEIGHT MANAGEMENT 10 MG/0.5ML ~~LOC~~ SOAJ: 10.0000 mg | SUBCUTANEOUS | 2 refills | Status: DC

## 2023-09-13 NOTE — Addendum Note (Signed)
 Addended by: Sandy Salaam on: 09/13/2023 01:13 PM   Modules accepted: Orders

## 2023-09-14 ENCOUNTER — Other Ambulatory Visit: Payer: Self-pay

## 2023-09-14 ENCOUNTER — Encounter: Payer: PRIVATE HEALTH INSURANCE | Admitting: Dermatology

## 2023-09-14 DIAGNOSIS — M25552 Pain in left hip: Secondary | ICD-10-CM | POA: Diagnosis not present

## 2023-09-14 MED ORDER — TIRZEPATIDE-WEIGHT MANAGEMENT 5 MG/0.5ML ~~LOC~~ SOLN: 10.0000 mg | SUBCUTANEOUS | 1 refills | Status: DC

## 2023-09-19 DIAGNOSIS — M25552 Pain in left hip: Secondary | ICD-10-CM | POA: Diagnosis not present

## 2023-09-20 ENCOUNTER — Other Ambulatory Visit: Payer: Self-pay | Admitting: Internal Medicine

## 2023-09-21 DIAGNOSIS — M25552 Pain in left hip: Secondary | ICD-10-CM | POA: Diagnosis not present

## 2023-09-26 ENCOUNTER — Telehealth: Payer: Self-pay

## 2023-09-26 NOTE — Telephone Encounter (Signed)
 Copied from CRM [id number]. Topic: Clinical - Prescription Issue >> Sep 26, 2023 12:46 PM Sim Boast F wrote: Reason for CRM: Patient requested call back regarding Zepbound, says she received .5mg  and it was supposed to be .10mg . Would like call back from [place] or whoever can help.

## 2023-09-26 NOTE — Telephone Encounter (Signed)
 Called patient's pharmacy to follow up with prescription for Zepbound 10 mg, that was sent on 09/13/23. Unable to speak with a live agent, just an automated service. Called patient to make her aware that there was a prescription for Zepbound 10 mg sent her to pharmacy. Patient says she was going to try and get in contact with them. Patient says they currently have a special going on and she would like to get it. Patient was advised to give our office a call back if she any other questions or concerns. Verbalized understanding.

## 2023-09-28 DIAGNOSIS — M25552 Pain in left hip: Secondary | ICD-10-CM | POA: Diagnosis not present

## 2023-09-29 DIAGNOSIS — M7062 Trochanteric bursitis, left hip: Secondary | ICD-10-CM | POA: Diagnosis not present

## 2023-10-02 ENCOUNTER — Other Ambulatory Visit: Payer: Self-pay | Admitting: Internal Medicine

## 2023-10-05 ENCOUNTER — Telehealth: Payer: Self-pay

## 2023-10-05 NOTE — Telephone Encounter (Signed)
 Copied from CRM [id number] [id number]. Topic: Clinical - Prescription Issue >> Oct 05, 2023  9:06 AM Desma Mcgregor wrote: Reason for CRM: For tirzepatide (ZEPBOUND) 5 MG/0.5ML injection vial, patient says [facility] keeps sending sending/requesting the wrong rx. May need to get this sent locally for a 3 mo supply instead. Patient gets the vials of 5mg , has been using two of them to make 10mg , but says she needs the dose increased to 15mg . Asking that Shanda Bumps give her a call back this afternoon if possible.

## 2023-10-06 ENCOUNTER — Other Ambulatory Visit: Payer: Self-pay | Admitting: Internal Medicine

## 2023-10-06 ENCOUNTER — Telehealth: Payer: Self-pay | Admitting: Internal Medicine

## 2023-10-06 MED ORDER — TIRZEPATIDE-WEIGHT MANAGEMENT 10 MG/0.5ML ~~LOC~~ SOAJ: 10.0000 mg | SUBCUTANEOUS | 2 refills | Status: DC

## 2023-10-06 MED ORDER — ZEPBOUND 12.5 MG/0.5ML ~~LOC~~ SOAJ: 12.5000 mg | SUBCUTANEOUS | 2 refills | Status: DC

## 2023-10-06 NOTE — Telephone Encounter (Signed)
 See previous message

## 2023-10-06 NOTE — Telephone Encounter (Signed)
 Prescription Request  10/06/2023  LOV: 05/03/2023  What is the name of the medication or equipment? tirzepatide (ZEPBOUND) 10 MG/0.5ML Pen   Have you contacted your pharmacy to request a refill? No   Which pharmacy would you like this sent to?  [facility] [address] [place], [place] [address]   Patient notified that their request is being sent to the clinical staff for review and that they should receive a response within 2 business days.   Please advise at Mobile [phone] (mobile)

## 2023-10-06 NOTE — Telephone Encounter (Unsigned)
 Copied from CRM [id number]. Topic: Clinical - Medication Question >> Oct 06, 2023 11:56 AM Shereese L wrote: Reason for CRM: needs a refill for a correct dosage by using [facility] and [facility] [address] for a 3 month supply for tirzepatide (ZEPBOUND) 15 MG/0.5ML Pen

## 2023-10-06 NOTE — Telephone Encounter (Signed)
 See telephone encounter.

## 2023-10-06 NOTE — Telephone Encounter (Signed)
 Spoke with pt and she stated that she would like to increase to the 12.5 mg dose. She stated that she has been on the 10 mg dose for 3 weeks and hasn't lost any weight. If okay to send she would [facility] [address]

## 2023-10-06 NOTE — Telephone Encounter (Signed)
 Attempted to call pt. No answer. Unable to leave a voicemail.

## 2023-10-10 ENCOUNTER — Other Ambulatory Visit (HOSPITAL_COMMUNITY): Payer: Self-pay

## 2023-10-11 ENCOUNTER — Other Ambulatory Visit: Payer: Self-pay | Admitting: Internal Medicine

## 2023-10-11 DIAGNOSIS — Z1231 Encounter for screening mammogram for malignant neoplasm of breast: Secondary | ICD-10-CM

## 2023-10-13 ENCOUNTER — Ambulatory Visit
Admission: RE | Admit: 2023-10-13 | Discharge: 2023-10-13 | Disposition: A | Discharge status: Home / Self Care | Source: Ambulatory Visit | Attending: Internal Medicine | Admitting: Internal Medicine

## 2023-10-13 DIAGNOSIS — Z1231 Encounter for screening mammogram for malignant neoplasm of breast: Secondary | ICD-10-CM | POA: Diagnosis not present

## 2023-10-13 DIAGNOSIS — H43812 Vitreous degeneration, left eye: Secondary | ICD-10-CM | POA: Diagnosis not present

## 2023-10-13 DIAGNOSIS — H35372 Puckering of macula, left eye: Secondary | ICD-10-CM | POA: Diagnosis not present

## 2023-10-13 DIAGNOSIS — H2513 Age-related nuclear cataract, bilateral: Secondary | ICD-10-CM | POA: Diagnosis not present

## 2023-10-19 ENCOUNTER — Telehealth: Payer: Self-pay

## 2023-10-19 ENCOUNTER — Other Ambulatory Visit (HOSPITAL_COMMUNITY): Payer: Self-pay

## 2023-10-19 ENCOUNTER — Other Ambulatory Visit: Payer: Self-pay | Admitting: Internal Medicine

## 2023-10-19 MED ORDER — ZEPBOUND 12.5 MG/0.5ML ~~LOC~~ SOAJ
12.5000 mg | SUBCUTANEOUS | 2 refills | Status: DC
Start: 2023-10-19 — End: 2023-10-19

## 2023-10-19 MED ORDER — ZEPBOUND 12.5 MG/0.5ML ~~LOC~~ SOAJ
12.5000 mg | SUBCUTANEOUS | 0 refills | Status: DC
Start: 2023-10-19 — End: 2023-11-03

## 2023-10-19 MED ORDER — ZEPBOUND 12.5 MG/0.5ML ~~LOC~~ SOAJ: 12.5000 mg | SUBCUTANEOUS | 0 refills | Status: DC

## 2023-10-19 NOTE — Telephone Encounter (Signed)
 Pharmacy Patient Advocate Encounter   Received notification from RX Request Messages that prior authorization for Zepbound injectable pens is required/requested.   Insurance verification completed.   The patient is insured through [employer] .   Per test claim:  Product/Service Not Covered - Plan/Benefit Exclusion

## 2023-10-19 NOTE — Telephone Encounter (Signed)
 Pt has previously been receiving via good[facility] and [facility] vouchers, weight loss medication is not covered by pt's plan per test claim. Please sign off on rx in this encounter as PA team is unable to resolve RX requests. Thank you

## 2023-10-23 ENCOUNTER — Other Ambulatory Visit: Payer: Self-pay | Admitting: Internal Medicine

## 2023-11-02 DIAGNOSIS — M5417 Radiculopathy, lumbosacral region: Secondary | ICD-10-CM | POA: Diagnosis not present

## 2023-11-02 DIAGNOSIS — M9904 Segmental and somatic dysfunction of sacral region: Secondary | ICD-10-CM | POA: Diagnosis not present

## 2023-11-02 DIAGNOSIS — M9905 Segmental and somatic dysfunction of pelvic region: Secondary | ICD-10-CM | POA: Diagnosis not present

## 2023-11-02 DIAGNOSIS — M25552 Pain in left hip: Secondary | ICD-10-CM | POA: Diagnosis not present

## 2023-11-02 DIAGNOSIS — M7072 Other bursitis of hip, left hip: Secondary | ICD-10-CM | POA: Diagnosis not present

## 2023-11-02 DIAGNOSIS — M7918 Myalgia, other site: Secondary | ICD-10-CM | POA: Diagnosis not present

## 2023-11-03 ENCOUNTER — Ambulatory Visit: Payer: PRIVATE HEALTH INSURANCE | Admitting: Internal Medicine

## 2023-11-03 ENCOUNTER — Telehealth: Payer: Self-pay | Admitting: Pharmacy Technician

## 2023-11-03 ENCOUNTER — Other Ambulatory Visit (HOSPITAL_COMMUNITY): Payer: Self-pay

## 2023-11-03 ENCOUNTER — Encounter: Payer: Self-pay | Admitting: Internal Medicine

## 2023-11-03 VITALS — BP 122/64 | HR 75 | Ht 66.0 in | Wt 166.4 lb

## 2023-11-03 DIAGNOSIS — E7800 Pure hypercholesterolemia, unspecified: Secondary | ICD-10-CM | POA: Diagnosis not present

## 2023-11-03 DIAGNOSIS — E663 Overweight: Secondary | ICD-10-CM

## 2023-11-03 DIAGNOSIS — R5383 Other fatigue: Secondary | ICD-10-CM | POA: Diagnosis not present

## 2023-11-03 DIAGNOSIS — E890 Postprocedural hypothyroidism: Secondary | ICD-10-CM

## 2023-11-03 DIAGNOSIS — E032 Hypothyroidism due to medicaments and other exogenous substances: Secondary | ICD-10-CM

## 2023-11-03 DIAGNOSIS — R7303 Prediabetes: Secondary | ICD-10-CM

## 2023-11-03 DIAGNOSIS — Z90710 Acquired absence of both cervix and uterus: Secondary | ICD-10-CM

## 2023-11-03 LAB — LIPID PANEL
Cholesterol: 215 mg/dL — ABNORMAL HIGH (ref 0–200)
HDL: 62 mg/dL (ref >39.00)
LDL Cholesterol: 130 mg/dL — ABNORMAL HIGH (ref 0–99)
NonHDL: 153.27
Total CHOL/HDL Ratio: 3
Triglycerides: 116 mg/dL (ref 0.0–149.0)
VLDL: 23.2 mg/dL (ref 0.0–40.0)

## 2023-11-03 LAB — HEMOGLOBIN A1C: Hgb A1c MFr Bld: 5.8 % (ref 4.6–6.5)

## 2023-11-03 LAB — CBC WITH DIFFERENTIAL/PLATELET
Basophils Absolute: 0 10*3/uL (ref 0.0–0.1)
Basophils Relative: 0.8 % (ref 0.0–3.0)
Eosinophils Absolute: 0.1 10*3/uL (ref 0.0–0.7)
Eosinophils Relative: 2.6 % (ref 0.0–5.0)
HCT: 41.3 % (ref 36.0–46.0)
Hemoglobin: 13.8 g/dL (ref 12.0–15.0)
Lymphocytes Relative: 22.5 % (ref 12.0–46.0)
Lymphs Abs: 1.1 10*3/uL (ref 0.7–4.0)
MCHC: 33.3 g/dL (ref 30.0–36.0)
MCV: 89.7 fl (ref 78.0–100.0)
Monocytes Absolute: 0.4 10*3/uL (ref 0.1–1.0)
Monocytes Relative: 9.5 % (ref 3.0–12.0)
Neutro Abs: 3.1 10*3/uL (ref 1.4–7.7)
Neutrophils Relative %: 64.6 % (ref 43.0–77.0)
Platelets: 364 10*3/uL (ref 150.0–400.0)
RBC: 4.61 Mil/uL (ref 3.87–5.11)
RDW: 13.4 % (ref 11.5–15.5)
WBC: 4.7 10*3/uL (ref 4.0–10.5)

## 2023-11-03 LAB — COMPREHENSIVE METABOLIC PANEL WITH GFR
ALT: 19 U/L (ref 0–35)
AST: 18 U/L (ref 0–37)
Albumin: 4.5 g/dL (ref 3.5–5.2)
Alkaline Phosphatase: 57 U/L (ref 39–117)
BUN: 15 mg/dL (ref 6–23)
CO2: 28 meq/L (ref 19–32)
Calcium: 8.9 mg/dL (ref 8.4–10.5)
Chloride: 102 meq/L (ref 96–112)
Creatinine, Ser: 0.87 mg/dL (ref 0.40–1.20)
GFR: 69.34 mL/min (ref >60.00)
Glucose, Bld: 98 mg/dL (ref 70–99)
Potassium: 4.2 meq/L (ref 3.5–5.1)
Sodium: 138 meq/L (ref 135–145)
Total Bilirubin: 0.4 mg/dL (ref 0.2–1.2)
Total Protein: 7.1 g/dL (ref 6.0–8.3)

## 2023-11-03 LAB — TSH: TSH: 2.15 u[IU]/mL (ref 0.35–5.50)

## 2023-11-03 LAB — LDL CHOLESTEROL, DIRECT: Direct LDL: 140 mg/dL

## 2023-11-03 MED ORDER — ESTRADIOL 0.1 MG/GM VA CREA
1.0000 g | TOPICAL_CREAM | Freq: Every day | VAGINAL | 12 refills | Status: AC
Start: 2023-11-03 — End: ?

## 2023-11-03 MED ORDER — TRIAMCINOLONE ACETONIDE 0.1 % EX CREA: 1.0000 | TOPICAL_CREAM | Freq: Two times a day (BID) | CUTANEOUS | 0 refills | Status: AC

## 2023-11-03 MED ORDER — PANTOPRAZOLE SODIUM 40 MG PO TBEC: 40.0000 mg | DELAYED_RELEASE_TABLET | Freq: Every day | ORAL | 3 refills | Status: AC

## 2023-11-03 MED ORDER — TIRZEPATIDE-WEIGHT MANAGEMENT 15 MG/0.5ML ~~LOC~~ SOAJ: 15.0000 mg | SUBCUTANEOUS | 2 refills | Status: DC

## 2023-11-03 MED ORDER — TIRZEPATIDE 15 MG/0.5ML ~~LOC~~ SOAJ: 15.0000 mg | SUBCUTANEOUS | 3 refills | Status: DC

## 2023-11-03 NOTE — Telephone Encounter (Signed)
 Ozempic /Mounjaro  is approved exclusively as an adjunct to diet and exercise to improve glycemic control in adults with type 2 diabetes mellitus. A review of patient's medical chart reveals no documented diagnosis of type 2 diabetes or an A1C indicative of diabetes. Therefore, they do not currently meet the criteria for prior authorization of this medication. If clinically appropriate, alternative options such as Saxenda , Zepbound , or Wegovy  may be considered for this patient.  Pt was previously being prescribed Zepbound . Please advise on current patient therapy.  CMM  Key: [id number]

## 2023-11-03 NOTE — Patient Instructions (Signed)
 I have sent the 15 mg Zepbound  to Emily Arellano  I have sent the E[facility] to [facility]  Insert 1 gram intravaginally at bedtime daily for 2 weeks,  Then reduce use to twice weekly thereafter   You can also apply a small dab (1 gram ) around the urethra and introitus at bedtime daily with the same frequency

## 2023-11-03 NOTE — Progress Notes (Signed)
 Subjective:  Patient ID: Emily Arellano, female    DOB: 1955/07/16  Age: 68 y.o. MRN: [id number]  CC: The primary encounter diagnosis was Pure hypercholesterolemia. Diagnoses of Prediabetes, Iatrogenic hypothyroidism, Other fatigue, Overweight (BMI 25.0-29.9), Postsurgical hypothyroidism, and Acquired absence of both cervix and uterus were also pertinent to this visit.   HPI Emily Arellano presents for  Chief Complaint  Patient presents with   Medical Management of Chronic Issues    6 month follow up    1) left hip bursitis  for the past 5 years,  since her [place]  trip.  Has had cortisone injections  and PT,    IT band tight ,  preventing her from exercise .  tried acupuncture starting yesterday with [facility],  going to do it twice weekly.  2) overweight:  using Zepbound  to lose weight.  Eating 2  meals daily .  Has lost 4 lbs since October   3) vaginal dryness:  wants to try estrogen cream    Outpatient Medications Prior to Visit  Medication Sig Dispense Refill   albuterol  (ACCUNEB ) 0.63 MG/3ML nebulizer solution Take 3 mLs (0.63 mg total) by nebulization every 6 (six) hours as needed for wheezing. 75 mL 12   Cholecalciferol (VITAMIN D3) 2000 UNITS capsule Take by mouth.     cyanocobalamin  (VITAMIN B12) 1000 MCG/ML injection INJECT 1 MLS INTO THE MUSCLE ONCE A WEEK. 4 mL 2   cyclobenzaprine  (FLEXERIL ) 10 MG tablet Take 1 tablet (10 mg total) by mouth 3 (three) times daily. 270 tablet 1   fluticasone  (FLONASE ) 50 MCG/ACT nasal spray USE ONE SPRAY INTO BOTH NOSTRILS EVERY DAY 16 g 2   levothyroxine  (SYNTHROID ) 88 MCG tablet TAKE 1 TABLET EVERY DAY ON EMPTY STOMACHWITH A GLASS OF WATER AT LEAST 30-60 MINBEFORE BREAKFAST 90 tablet 3   MAGNESIUM PO Take 3-4 tablets by mouth at bedtime.     ondansetron  (ZOFRAN ) 4 MG tablet Take 1 tablet (4 mg total) by mouth every 8 (eight) hours as needed for nausea or vomiting. 30 tablet 2   temazepam  (RESTORIL ) 30 MG capsule TAKE 1 CAPSULE (30 MG) BY  MOUTH AT BEDTIME AS NEEDED FOR SLEEP 30 capsule 5   pantoprazole  (PROTONIX ) 40 MG tablet TAKE 1 TABLET BY MOUTH ONCE DAILY 90 tablet 3   tirzepatide  (ZEPBOUND ) 12.5 MG/0.5ML Pen Inject 12.5 mg into the skin once a week. 6 mL 0   triamcinolone  cream (KENALOG ) 0.1 % Apply 1 Application topically 2 (two) times daily. Friday, Saturday and Sunday only. Avoid face, groin and underarms. 45 g 0   No facility-administered medications prior to visit.    Review of Systems;  Patient denies headache, fevers, malaise, unintentional weight loss, skin rash, eye pain, sinus congestion and sinus pain, sore throat, dysphagia,  hemoptysis , cough, dyspnea, wheezing, chest pain, palpitations, orthopnea, edema, abdominal pain, nausea, melena, diarrhea, constipation, flank pain, dysuria, hematuria, urinary  Frequency, nocturia, numbness, tingling, seizures,  Focal weakness, Loss of consciousness,  Tremor, insomnia, depression, anxiety, and suicidal ideation.      Objective:  BP 122/64   Pulse 75   Ht 5\' 6"  (1.676 m)   Wt 166 lb 6.4 oz (75.5 kg)   SpO2 98%   BMI 26.86 kg/m   BP Readings from Last 3 Encounters:  11/03/23 122/64  05/03/23 116/60  01/04/23 124/72    Wt Readings from Last 3 Encounters:  11/03/23 166 lb 6.4 oz (75.5 kg)  05/03/23 170 lb (77.1 kg)  04/03/23 164 lb (  74.4 kg)    Physical Exam Vitals reviewed.  Constitutional:      General: She is not in acute distress.    Appearance: Normal appearance. She is normal weight. She is not ill-appearing, toxic-appearing or diaphoretic.  HENT:     Head: Normocephalic.  Eyes:     General: No scleral icterus.       Right eye: No discharge.        Left eye: No discharge.     Conjunctiva/sclera: Conjunctivae normal.  Musculoskeletal:        General: Normal range of motion.  Skin:    General: Skin is warm and dry.  Neurological:     General: No focal deficit present.     Mental Status: She is alert and oriented to person, place, and time.  Mental status is at baseline.  Psychiatric:        Mood and Affect: Mood normal.        Behavior: Behavior normal.        Thought Content: Thought content normal.        Judgment: Judgment normal.    Lab Results  Component Value Date   HGBA1C 5.8 11/03/2023   HGBA1C 5.7 09/02/2022   HGBA1C 5.8 04/01/2022    Lab Results  Component Value Date   CREATININE 0.87 11/03/2023   CREATININE 0.82 01/04/2023   CREATININE 0.90 09/02/2022    Lab Results  Component Value Date   WBC 4.7 11/03/2023   HGB 13.8 11/03/2023   HCT 41.3 11/03/2023   PLT 364.0 11/03/2023   GLUCOSE 98 11/03/2023   CHOL 215 (H) 11/03/2023   TRIG 116.0 11/03/2023   HDL 62.00 11/03/2023   LDLDIRECT 140.0 11/03/2023   LDLCALC 130 (H) 11/03/2023   ALT 19 11/03/2023   AST 18 11/03/2023   NA 138 11/03/2023   K 4.2 11/03/2023   CL 102 11/03/2023   CREATININE 0.87 11/03/2023   BUN 15 11/03/2023   CO2 28 11/03/2023   TSH 2.15 11/03/2023   HGBA1C 5.8 11/03/2023   MICROALBUR <0.7 04/01/2022    MM 3D SCREENING MAMMOGRAM BILATERAL BREAST Result Date: 10/17/2023 CLINICAL DATA:  Screening. EXAM: DIGITAL SCREENING BILATERAL MAMMOGRAM WITH TOMOSYNTHESIS AND CAD TECHNIQUE: Bilateral screening digital craniocaudal and mediolateral oblique mammograms were obtained. Bilateral screening digital breast tomosynthesis was performed. The images were evaluated with computer-aided detection. COMPARISON:  Previous exam(s). ACR Breast Density Category b: There are scattered areas of fibroglandular density. FINDINGS: There are no findings suspicious for malignancy. IMPRESSION: No mammographic evidence of malignancy. A result letter of this screening mammogram will be mailed directly to the patient. RECOMMENDATION: Screening mammogram in one year. (Code:SM-B-01Y) BI-RADS CATEGORY  1: Negative. Electronically Signed   By: Sande Cromer M.D.   On: 10/17/2023 16:51    Assessment & Plan:  .Pure hypercholesterolemia Assessment & Plan: 10  YR RISK WITHOUT TREAMENT IS < 6%  Lab Results  Component Value Date   CHOL 215 (H) 11/03/2023   HDL 62.00 11/03/2023   LDLCALC 130 (H) 11/03/2023   LDLDIRECT 140.0 11/03/2023   TRIG 116.0 11/03/2023   CHOLHDL 3 11/03/2023     Orders: -     Lipid panel -     LDL cholesterol, direct  Prediabetes Assessment & Plan: A1c is improving with increased activity and dietary restrictions .     Lab Results  Component Value Date   HGBA1C 5.8 11/03/2023     Orders: -     Comprehensive metabolic panel with GFR -  Hemoglobin A1c  Iatrogenic hypothyroidism -     TSH  Other fatigue -     CBC with Differential/Platelet  Overweight (BMI 25.0-29.9) Assessment & Plan:  She had trouble suppressing appetite and maintaining the weight loss she achieved after stopping the Optavia diet , but has done well using GLP1 agonist therapy since 2022 .  She has been receiving a compounded form of generic Zepbound   for the last several months and believes it may be placebo/ineffective .  She has changed providers to [facility] in [place]    Postsurgical hypothyroidism Assessment & Plan: Thyroid  function is WNL on current dose. Of 75 mcg daily  No current changes needed.    Lab Results  Component Value Date   TSH 2.15 11/03/2023      Acquired absence of both cervix and uterus  Other orders -     Pantoprazole  Sodium; Take 1 tablet (40 mg total) by mouth daily.  Dispense: 90 tablet; Refill: 3 -     Triamcinolone  Acetonide; Apply 1 Application topically 2 (two) times daily. Friday, Saturday and Sunday only. Avoid face, groin and underarms.  Dispense: 45 g; Refill: 0 -     Estradiol; Place 1 g vaginally at bedtime. For 2 weeks,  then twice weekly thereafter  Dispense: 42.5 g; Refill: 12    .   Follow-up: Return in about 6 months (around 05/04/2024).   Thersia Flax, MD

## 2023-11-05 DIAGNOSIS — Z90710 Acquired absence of both cervix and uterus: Secondary | ICD-10-CM | POA: Insufficient documentation

## 2023-11-05 NOTE — Assessment & Plan Note (Signed)
 She had trouble suppressing appetite and maintaining the weight loss she achieved after stopping the Optavia diet , but has done well using GLP1 agonist therapy since 2022 .  She has been receiving a compounded form of generic Zepbound   for the last several months and believes it may be placebo/ineffective .  She has changed providers to [facility] in [place]

## 2023-11-05 NOTE — Assessment & Plan Note (Signed)
 Thyroid  function is WNL on current dose. Of 75 mcg daily  No current changes needed.    Lab Results  Component Value Date   TSH 2.15 11/03/2023

## 2023-11-05 NOTE — Assessment & Plan Note (Signed)
 A1c is improving with increased activity and dietary restrictions .     Lab Results  Component Value Date   HGBA1C 5.8 11/03/2023

## 2023-11-06 ENCOUNTER — Encounter: Payer: Self-pay | Admitting: Internal Medicine

## 2023-11-06 DIAGNOSIS — M7072 Other bursitis of hip, left hip: Secondary | ICD-10-CM | POA: Diagnosis not present

## 2023-11-06 DIAGNOSIS — M5417 Radiculopathy, lumbosacral region: Secondary | ICD-10-CM | POA: Diagnosis not present

## 2023-11-06 DIAGNOSIS — M25552 Pain in left hip: Secondary | ICD-10-CM | POA: Diagnosis not present

## 2023-11-06 DIAGNOSIS — M9905 Segmental and somatic dysfunction of pelvic region: Secondary | ICD-10-CM | POA: Diagnosis not present

## 2023-11-06 DIAGNOSIS — M7918 Myalgia, other site: Secondary | ICD-10-CM | POA: Diagnosis not present

## 2023-11-06 DIAGNOSIS — M9904 Segmental and somatic dysfunction of sacral region: Secondary | ICD-10-CM | POA: Diagnosis not present

## 2023-11-06 NOTE — Assessment & Plan Note (Signed)
 10 YR RISK WITHOUT TREAMENT IS < 6%  Lab Results  Component Value Date   CHOL 215 (H) 11/03/2023   HDL 62.00 11/03/2023   LDLCALC 130 (H) 11/03/2023   LDLDIRECT 140.0 11/03/2023   TRIG 116.0 11/03/2023   CHOLHDL 3 11/03/2023

## 2023-11-09 ENCOUNTER — Telehealth: Payer: Self-pay

## 2023-11-09 DIAGNOSIS — M7072 Other bursitis of hip, left hip: Secondary | ICD-10-CM | POA: Diagnosis not present

## 2023-11-09 DIAGNOSIS — M9904 Segmental and somatic dysfunction of sacral region: Secondary | ICD-10-CM | POA: Diagnosis not present

## 2023-11-09 DIAGNOSIS — M9905 Segmental and somatic dysfunction of pelvic region: Secondary | ICD-10-CM | POA: Diagnosis not present

## 2023-11-09 DIAGNOSIS — M25552 Pain in left hip: Secondary | ICD-10-CM | POA: Diagnosis not present

## 2023-11-09 DIAGNOSIS — M7918 Myalgia, other site: Secondary | ICD-10-CM | POA: Diagnosis not present

## 2023-11-09 DIAGNOSIS — M5417 Radiculopathy, lumbosacral region: Secondary | ICD-10-CM | POA: Diagnosis not present

## 2023-11-09 NOTE — Telephone Encounter (Signed)
 Copied from CRM [id number]. Topic: General - Other >> Nov 09, 2023 10:30 AM Dorisann Garre T wrote: Reason for CRM: patient is needing to know if the 15 mg mounjaro  is ok for her to take that's what she was prescribed at her pharmacy she would like a call back regarding this

## 2023-11-09 NOTE — Telephone Encounter (Signed)
 Copied from CRM [id number]. Topic: Clinical - Medication Question >> Nov 09, 2023 10:57 AM Keitha Pata L wrote: Reason for CRM: patient is returning a call back from [place] in reference to the medication and would like for Camilo Cella to give her a call back

## 2023-11-09 NOTE — Telephone Encounter (Signed)
 LMTCB. Need to find out what dose pt has been using? And for long she has been using her current dose?

## 2023-11-10 NOTE — Telephone Encounter (Signed)
 Please ask pt the following questions when she returns call  LMTCB. Need to find out what dose pt has been using? And for long she has been using her current dose?

## 2023-11-13 DIAGNOSIS — M5417 Radiculopathy, lumbosacral region: Secondary | ICD-10-CM | POA: Diagnosis not present

## 2023-11-13 DIAGNOSIS — M9905 Segmental and somatic dysfunction of pelvic region: Secondary | ICD-10-CM | POA: Diagnosis not present

## 2023-11-13 DIAGNOSIS — M7072 Other bursitis of hip, left hip: Secondary | ICD-10-CM | POA: Diagnosis not present

## 2023-11-13 DIAGNOSIS — M9904 Segmental and somatic dysfunction of sacral region: Secondary | ICD-10-CM | POA: Diagnosis not present

## 2023-11-13 NOTE — Telephone Encounter (Signed)
 Please ask pt the following questions when she returns call   LMTCB. Need to find out what dose pt has been using? And for long she has been using her current dose?

## 2023-11-29 ENCOUNTER — Encounter: Payer: Self-pay | Admitting: Internal Medicine

## 2023-12-07 ENCOUNTER — Ambulatory Visit (INDEPENDENT_AMBULATORY_CARE_PROVIDER_SITE_OTHER)

## 2023-12-07 ENCOUNTER — Ambulatory Visit: Admitting: Internal Medicine

## 2023-12-07 ENCOUNTER — Encounter: Payer: Self-pay | Admitting: Internal Medicine

## 2023-12-07 VITALS — BP 112/68 | HR 75 | Ht 66.0 in | Wt 164.0 lb

## 2023-12-07 DIAGNOSIS — E663 Overweight: Secondary | ICD-10-CM

## 2023-12-07 DIAGNOSIS — Z6825 Body mass index (BMI) 25.0-25.9, adult: Secondary | ICD-10-CM | POA: Diagnosis not present

## 2023-12-07 DIAGNOSIS — M7072 Other bursitis of hip, left hip: Secondary | ICD-10-CM | POA: Diagnosis not present

## 2023-12-07 DIAGNOSIS — Z01818 Encounter for other preprocedural examination: Secondary | ICD-10-CM | POA: Diagnosis not present

## 2023-12-07 MED ORDER — CELECOXIB 200 MG PO CAPS: 200.0000 mg | ORAL_CAPSULE | Freq: Two times a day (BID) | ORAL | 2 refills | Status: DC

## 2023-12-07 NOTE — Patient Instructions (Addendum)
  TAKE 2000 mg of acetominophen (tylenol) every day safely  In divided doses :   1000 mg every 12 hours.)   ADD CELEBREX  INSTEAD OF MOTRIN 200 MG EVERY 12 HOURS IF NEEDED  DON'T COMBINE WITH MOTRIN,  BUT IF MOTRIN WORKS BETTER,  LIMIT DOSE TO 800 MG EVERY 12 HOURS OR 600 MG EVERY 8 HOURS

## 2023-12-07 NOTE — Progress Notes (Signed)
 Subjective:  Patient ID: Emily Arellano, female    DOB: 20-Oct-1955  Age: 68 y.o. MRN: [id number]  CC: The primary encounter diagnosis was Preoperative evaluation to rule out surgical contraindication. Diagnoses of Iliopsoas bursitis of left hip and Overweight (BMI 25.0-29.9) were also pertinent to this visit.   HPI ANEITA KIGER presents for  Chief Complaint  Patient presents with   Medical Management of Chronic Issues    Discuss medication   Preoperative medical clearance, requested by her orthopedist, for future r left hip bursectomy.  She has no  history of CAD , no recent episodes of chest pain. She has no history of type 2 DM or CKD.    Outpatient Medications Prior to Visit  Medication Sig Dispense Refill   albuterol  (ACCUNEB ) 0.63 MG/3ML nebulizer solution Take 3 mLs (0.63 mg total) by nebulization every 6 (six) hours as needed for wheezing. 75 mL 12   Cholecalciferol (VITAMIN D3) 2000 UNITS capsule Take by mouth.     cyanocobalamin  (VITAMIN B12) 1000 MCG/ML injection INJECT 1 MLS INTO THE MUSCLE ONCE A WEEK. 4 mL 2   cyclobenzaprine  (FLEXERIL ) 10 MG tablet Take 1 tablet (10 mg total) by mouth 3 (three) times daily. 270 tablet 1   estradiol  (ESTRACE  VAGINAL) 0.1 MG/GM vaginal cream Place 1 g vaginally at bedtime. For 2 weeks,  then twice weekly thereafter 42.5 g 12   fluticasone  (FLONASE ) 50 MCG/ACT nasal spray USE ONE SPRAY INTO BOTH NOSTRILS EVERY DAY 16 g 2   levothyroxine  (SYNTHROID ) 88 MCG tablet TAKE 1 TABLET EVERY DAY ON EMPTY STOMACHWITH A GLASS OF WATER AT LEAST 30-60 MINBEFORE BREAKFAST 90 tablet 3   MAGNESIUM PO Take 3-4 tablets by mouth at bedtime.     ondansetron  (ZOFRAN ) 4 MG tablet Take 1 tablet (4 mg total) by mouth every 8 (eight) hours as needed for nausea or vomiting. 30 tablet 2   pantoprazole  (PROTONIX ) 40 MG tablet Take 1 tablet (40 mg total) by mouth daily. 90 tablet 3   temazepam  (RESTORIL ) 30 MG capsule TAKE 1 CAPSULE (30 MG) BY MOUTH AT BEDTIME AS NEEDED  FOR SLEEP 30 capsule 5   tirzepatide  (ZEPBOUND ) 15 MG/0.5ML Pen Inject 15 mg into the skin once a week. 2 mL 2   triamcinolone  cream (KENALOG ) 0.1 % Apply 1 Application topically 2 (two) times daily. Friday, Saturday and Sunday only. Avoid face, groin and underarms. 45 g 0   No facility-administered medications prior to visit.    Review of Systems;  Patient denies headache, fevers, malaise, unintentional weight loss, skin rash, eye pain, sinus congestion and sinus pain, sore throat, dysphagia,  hemoptysis , cough, dyspnea, wheezing, chest pain, palpitations, orthopnea, edema, abdominal pain, nausea, melena, diarrhea, constipation, flank pain, dysuria, hematuria, urinary  Frequency, nocturia, numbness, tingling, seizures,  Focal weakness, Loss of consciousness,  Tremor, insomnia, depression, anxiety, and suicidal ideation.      Objective:  BP 112/68   Pulse 75   Ht 5\' 6"  (1.676 m)   Wt 164 lb (74.4 kg)   SpO2 97%   BMI 26.47 kg/m   BP Readings from Last 3 Encounters:  12/07/23 112/68  11/03/23 122/64  05/03/23 116/60    Wt Readings from Last 3 Encounters:  12/07/23 164 lb (74.4 kg)  11/03/23 166 lb 6.4 oz (75.5 kg)  05/03/23 170 lb (77.1 kg)    Physical Exam Vitals reviewed.  Constitutional:      General: She is not in acute distress.    Appearance:  Normal appearance. She is normal weight. She is not ill-appearing, toxic-appearing or diaphoretic.  HENT:     Head: Normocephalic.  Eyes:     General: No scleral icterus.       Right eye: No discharge.        Left eye: No discharge.     Conjunctiva/sclera: Conjunctivae normal.  Cardiovascular:     Rate and Rhythm: Normal rate and regular rhythm.     Heart sounds: Normal heart sounds.  Pulmonary:     Effort: Pulmonary effort is normal. No respiratory distress.     Breath sounds: Normal breath sounds.  Musculoskeletal:        General: Normal range of motion.  Skin:    General: Skin is warm and dry.  Neurological:      General: No focal deficit present.     Mental Status: She is alert and oriented to person, place, and time. Mental status is at baseline.  Psychiatric:        Mood and Affect: Mood normal.        Behavior: Behavior normal.        Thought Content: Thought content normal.        Judgment: Judgment normal.    Lab Results  Component Value Date   HGBA1C 5.8 11/03/2023   HGBA1C 5.7 09/02/2022   HGBA1C 5.8 04/01/2022    Lab Results  Component Value Date   CREATININE 0.87 11/03/2023   CREATININE 0.82 01/04/2023   CREATININE 0.90 09/02/2022    Lab Results  Component Value Date   WBC 4.7 11/03/2023   HGB 13.8 11/03/2023   HCT 41.3 11/03/2023   PLT 364.0 11/03/2023   GLUCOSE 98 11/03/2023   CHOL 215 (H) 11/03/2023   TRIG 116.0 11/03/2023   HDL 62.00 11/03/2023   LDLDIRECT 140.0 11/03/2023   LDLCALC 130 (H) 11/03/2023   ALT 19 11/03/2023   AST 18 11/03/2023   NA 138 11/03/2023   K 4.2 11/03/2023   CL 102 11/03/2023   CREATININE 0.87 11/03/2023   BUN 15 11/03/2023   CO2 28 11/03/2023   TSH 2.15 11/03/2023   HGBA1C 5.8 11/03/2023   MICROALBUR <0.7 04/01/2022    MM 3D SCREENING MAMMOGRAM BILATERAL BREAST Result Date: 10/17/2023 CLINICAL DATA:  Screening. EXAM: DIGITAL SCREENING BILATERAL MAMMOGRAM WITH TOMOSYNTHESIS AND CAD TECHNIQUE: Bilateral screening digital craniocaudal and mediolateral oblique mammograms were obtained. Bilateral screening digital breast tomosynthesis was performed. The images were evaluated with computer-aided detection. COMPARISON:  Previous exam(s). ACR Breast Density Category b: There are scattered areas of fibroglandular density. FINDINGS: There are no findings suspicious for malignancy. IMPRESSION: No mammographic evidence of malignancy. A result letter of this screening mammogram will be mailed directly to the patient. RECOMMENDATION: Screening mammogram in one year. (Code:SM-B-01Y) BI-RADS CATEGORY  1: Negative. Electronically Signed   By: Sande Cromer M.D.   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  Continue NSAIDS and tylenol.  Patient  is considered to be at low risk  For perioperative complications  Based on today's exam and history.  I have ordered and reviewed a 12 lead EKG and find that there are no acute changes and patient is in sinus rhythm.  Baseline lytes,   including hgb are normal   Lab Results  Component Value Date   WBC 4.7 11/03/2023   HGB 13.8 11/03/2023   HCT 41.3 11/03/2023   MCV 89.7 11/03/2023   PLT 364.0 11/03/2023   Lab Results  Component Value Date   HGBA1C 5.8 11/03/2023      Overweight (BMI 25.0-29.9) Assessment & Plan:  She had trouble suppressing appetite and maintaining the weight loss she achieved after stopping the Optavia diet , but has done well using GLP1 agonist therapy since 2022 .  She is on the maximal dose of Zepbound  and has reduced her BMI to 26  with a 17 lb weight loss .Aaron Aas  No changes today    Other orders -     Celecoxib ; Take 1 capsule (200 mg total) by mouth 2 (two) times daily.  Dispense: 60 capsule; Refill: 2     I spent 34 minutes on the day of this face to face encounter reviewing patient's  most recent visit with orthopedics,   prior relevant surgical and non surgical procedures, recent  labs and imaging studies, counseling on weight management,  reviewing the assessment and plan with patient, and post visit ordering and reviewing of  diagnostics and therapeutics with patient  .   Follow-up: No follow-ups on file.   Thersia Flax, MD

## 2023-12-08 LAB — URINALYSIS, ROUTINE W REFLEX MICROSCOPIC
Bilirubin Urine: NEGATIVE
Hgb urine dipstick: NEGATIVE
Ketones, ur: NEGATIVE
Leukocytes,Ua: NEGATIVE
Nitrite: NEGATIVE
RBC / HPF: NONE SEEN (ref 0–?)
Specific Gravity, Urine: 1.02 (ref 1.000–1.030)
Total Protein, Urine: NEGATIVE
Urine Glucose: NEGATIVE
Urobilinogen, UA: 0.2 (ref 0.0–1.0)
pH: 6 (ref 5.0–8.0)

## 2023-12-10 ENCOUNTER — Ambulatory Visit: Payer: Self-pay | Admitting: Internal Medicine

## 2023-12-10 NOTE — Assessment & Plan Note (Addendum)
 Chronic,  acquired .  Continue NSAIDS and tylenol.  Patient  is considered to be at low risk  For perioperative complications  Based on today's exam and history.  I have ordered and reviewed a 12 lead EKG and find that there are no acute changes and patient is in sinus rhythm.   Baseline lytes,   including hgb are normal   Lab Results  Component Value Date   WBC 4.7 11/03/2023   HGB 13.8 11/03/2023   HCT 41.3 11/03/2023   MCV 89.7 11/03/2023   PLT 364.0 11/03/2023   Lab Results  Component Value Date   HGBA1C 5.8 11/03/2023

## 2023-12-10 NOTE — Assessment & Plan Note (Addendum)
 She had trouble suppressing appetite and maintaining the weight loss she achieved after stopping the Optavia diet , but has done well using GLP1 agonist therapy since 2022 .  She is on the maximal dose of Zepbound  and has reduced her BMI to 26  with a 17 lb weight loss .Aaron Aas  No changes today

## 2023-12-13 ENCOUNTER — Encounter: Payer: Self-pay | Admitting: Internal Medicine

## 2023-12-14 ENCOUNTER — Other Ambulatory Visit: Payer: Self-pay | Admitting: Internal Medicine

## 2023-12-21 ENCOUNTER — Encounter: Payer: Self-pay | Admitting: Dermatology

## 2023-12-21 ENCOUNTER — Ambulatory Visit: Payer: PRIVATE HEALTH INSURANCE | Admitting: Dermatology

## 2023-12-21 DIAGNOSIS — S80869S Insect bite (nonvenomous), unspecified lower leg, sequela: Secondary | ICD-10-CM

## 2023-12-21 DIAGNOSIS — L905 Scar conditions and fibrosis of skin: Secondary | ICD-10-CM

## 2023-12-21 DIAGNOSIS — L700 Acne vulgaris: Secondary | ICD-10-CM

## 2023-12-21 DIAGNOSIS — D225 Melanocytic nevi of trunk: Secondary | ICD-10-CM

## 2023-12-21 DIAGNOSIS — L814 Other melanin hyperpigmentation: Secondary | ICD-10-CM

## 2023-12-21 DIAGNOSIS — Z87828 Personal history of other (healed) physical injury and trauma: Secondary | ICD-10-CM

## 2023-12-21 DIAGNOSIS — W908XXA Exposure to other nonionizing radiation, initial encounter: Secondary | ICD-10-CM

## 2023-12-21 DIAGNOSIS — Z1283 Encounter for screening for malignant neoplasm of skin: Secondary | ICD-10-CM | POA: Diagnosis not present

## 2023-12-21 DIAGNOSIS — D1801 Hemangioma of skin and subcutaneous tissue: Secondary | ICD-10-CM | POA: Diagnosis not present

## 2023-12-21 DIAGNOSIS — L709 Acne, unspecified: Secondary | ICD-10-CM

## 2023-12-21 DIAGNOSIS — L578 Other skin changes due to chronic exposure to nonionizing radiation: Secondary | ICD-10-CM

## 2023-12-21 DIAGNOSIS — D492 Neoplasm of unspecified behavior of bone, soft tissue, and skin: Secondary | ICD-10-CM | POA: Diagnosis not present

## 2023-12-21 DIAGNOSIS — D229 Melanocytic nevi, unspecified: Secondary | ICD-10-CM

## 2023-12-21 DIAGNOSIS — D485 Neoplasm of uncertain behavior of skin: Secondary | ICD-10-CM

## 2023-12-21 DIAGNOSIS — L720 Epidermal cyst: Secondary | ICD-10-CM

## 2023-12-21 DIAGNOSIS — L821 Other seborrheic keratosis: Secondary | ICD-10-CM

## 2023-12-21 DIAGNOSIS — D239 Other benign neoplasm of skin, unspecified: Secondary | ICD-10-CM

## 2023-12-21 HISTORY — DX: Other benign neoplasm of skin, unspecified: D23.9

## 2023-12-21 MED ORDER — TRETINOIN 0.05 % EX CREA: TOPICAL_CREAM | CUTANEOUS | 6 refills | Status: AC

## 2023-12-21 MED ORDER — TRIAMCINOLONE ACETONIDE 0.147 MG/GM EX AERS: INHALATION_SPRAY | CUTANEOUS | 1 refills | Status: AC

## 2023-12-21 NOTE — Progress Notes (Signed)
 Follow-Up Visit   Subjective  Emily Arellano is a 68 y.o. female who presents for the following: Skin Cancer Screening and Full Body Skin Exam  The patient presents for Total-Body Skin Exam (TBSE) for skin cancer screening and mole check. The patient has spots, moles and lesions to be evaluated, some may be new or changing and the patient may have concern these could be cancer.  Pt c/o lesions on the face that she would like checked today. Pt would like refills of TMC spray, she uses it for mite bites.   The following portions of the chart were reviewed this encounter and updated as appropriate: medications, allergies, medical history  Review of Systems:  No other skin or systemic complaints except as noted in HPI or Assessment and Plan.  Objective  Well appearing patient in no apparent distress; mood and affect are within normal limits.  A full examination was performed including scalp, head, eyes, ears, nose, lips, neck, chest, axillae, abdomen, back, buttocks, bilateral upper extremities, bilateral lower extremities, hands, feet, fingers, toes, fingernails, and toenails. All findings within normal limits unless otherwise noted below.   Relevant physical exam findings are noted in the Assessment and Plan.  R mid back at bra line 0.3 cm irregular brown macule.   Assessment & Plan   SKIN CANCER SCREENING PERFORMED TODAY.  ACTINIC DAMAGE - Chronic condition, secondary to cumulative UV/sun exposure - diffuse scaly erythematous macules with underlying dyspigmentation - Recommend daily broad spectrum sunscreen SPF 30+ to sun-exposed areas, reapply every 2 hours as needed.  - Staying in the shade or wearing long sleeves, sun glasses (UVA+UVB protection) and wide brim hats (4-inch brim around the entire circumference of the hat) are also recommended for sun protection.  - Call for new or changing lesions.  LENTIGINES, SEBORRHEIC KERATOSES, HEMANGIOMAS - Benign normal skin lesions -  Benign-appearing - Call for any changes  MELANOCYTIC NEVI - Tan-brown and/or pink-flesh-colored symmetric macules and papules - Benign appearing on exam today - Observation - Call clinic for new or changing moles - Recommend daily use of broad spectrum spf 30+ sunscreen to sun-exposed areas.   Hx of mite bites -  - Continue Kenalog  spray to aa's BID PRN. Topical steroids (such as triamcinolone , fluocinolone, fluocinonide, mometasone, clobetasol, halobetasol, betamethasone, hydrocortisone) can cause thinning and lightening of the skin if they are used for too long in the same area. Your physician has selected the right strength medicine for your problem and area affected on the body. Please use your medication only as directed by your physician to prevent side effects.  - Recommend clothing impregnated with permethrin   Acne/Milia - tiny firm white papules - type of cyst - benign - sometimes these will clear with nightly OTC adapalene/Differin 0.1% gel or retinol. - may be extracted if symptomatic - observe - start Tretinoin 0.05% cream QHS. Topical retinoid medications like tretinoin/Retin-A, adapalene/Differin, tazarotene/Fabior, and Epiduo/Epiduo Forte can cause dryness and irritation when first started. Only apply a pea-sized amount to the entire affected area. Avoid applying it around the eyes, edges of mouth and creases at the nose. If you experience irritation, use a good moisturizer first and/or apply the medicine less often. If you are doing well with the medicine, you can increase how often you use it until you are applying every night. Be careful with sun protection while using this medication as it can make you sensitive to the sun. This medicine should not be used by pregnant women.  NEOPLASM OF UNCERTAIN BEHAVIOR OF SKIN R mid back at bra line Epidermal / dermal shaving  Lesion diameter (cm):  0.3 Informed consent: discussed and consent obtained   Timeout: patient name,  date of birth, surgical site, and procedure verified   Procedure prep:  Patient was prepped and draped in usual sterile fashion Prep type:  Isopropyl alcohol Anesthesia: the lesion was anesthetized in a standard fashion   Anesthetic:  1% lidocaine  w/ epinephrine  1-100,000 buffered w/ 8.4% NaHCO3 Instrument used: flexible razor blade   Hemostasis achieved with: pressure, aluminum chloride and electrodesiccation   Outcome: patient tolerated procedure well   Post-procedure details: sterile dressing applied and wound care instructions given   Dressing type: bandage (Mupirocin 2% ointment)   Specimen 1 - Surgical pathology Differential Diagnosis: D48.5 r/o dysplastic nevus Check Margins: Yes  SCAR - burn from topical steroid and TENS unit.  Exam: Dyspigmented smooth macule or patch. 1.0 x 0.6 cm Benign-appearing.  Observation.  Call clinic for new or changing lesions. Recommend daily broad spectrum sunscreen SPF 30+, reapply every 2 hours as needed. Treatment: Recommend Serica moisturizing scar formula cream every night or [facility] brand or Mederma silicone scar sheet every night for the first year after a scar appears to help with scar remodeling if desired. Scars remodel on their own for a full year and will gradually improve in appearance over time.  Return in about 1 year (around 12/20/2024) for TBSE.  Arlinda Lais, CMA, am acting as scribe for Celine Collard, MD .   Documentation: I have reviewed the above documentation for accuracy and completeness, and I agree with the above.  Celine Collard, MD

## 2023-12-21 NOTE — Patient Instructions (Addendum)
 Insect Shield - clothing to prevent bug bites [email or web address]  [address], [place], [place] [address] [phone] or [phone]  Wound Care Instructions  Cleanse wound gently with soap and water once a day then pat dry with clean gauze. Apply a thin coat of Petrolatum (petroleum jelly, Vaseline) over the wound (unless you have an allergy to this). We recommend that you use a new, sterile tube of Vaseline. Do not pick or remove scabs. Do not remove the yellow or white healing tissue from the base of the wound.  Cover the wound with fresh, clean, nonstick gauze and secure with paper tape. You may use Band-Aids in place of gauze and tape if the wound is small enough, but would recommend trimming much of the tape off as there is often too much. Sometimes Band-Aids can irritate the skin.  You should call the office for your biopsy report after 1 week if you have not already been contacted.  If you experience any problems, such as abnormal amounts of bleeding, swelling, significant bruising, significant pain, or evidence of infection, please call the office immediately.  FOR ADULT SURGERY PATIENTS: If you need something for pain relief you may take 1 extra strength Tylenol (acetaminophen) AND 2 Ibuprofen (200mg  each) together every 4 hours as needed for pain. (do not take these if you are allergic to them or if you have a reason you should not take them.) Typically, you may only need pain medication for 1 to 3 days.     Due to recent changes in healthcare laws, you may see results of your pathology and/or laboratory studies on MyChart before the doctors have had a chance to review them. We understand that in some cases there may be results that are confusing or concerning to you. Please understand that not all results are received at the same time and often the doctors may need to interpret multiple results in order to provide you with the best plan of care or course of  treatment. Therefore, we ask that you please give us  2 business days to thoroughly review all your results before contacting the office for clarification. Should we see a critical lab result, you will be contacted sooner.   If You Need Anything After Your Visit  If you have any questions or concerns for your doctor, please call our main line at [phone] and press option 4 to reach your doctor's medical assistant. If no one answers, please leave a voicemail as directed and we will return your call as soon as possible. Messages left after 4 pm will be answered the following business day.   You may also send us  a message via MyChart. We typically respond to MyChart messages within 1-2 business days.  For prescription refills, please ask your pharmacy to contact our office. Our fax number is [phone].  If you have an urgent issue when the clinic is closed that cannot wait until the next business day, you can page your doctor at the number below.    Please note that while we do our best to be available for urgent issues outside of office hours, we are not available 24/7.   If you have an urgent issue and are unable to reach us , you may choose to seek medical care at your doctor's office, retail clinic, urgent care center, or emergency room.  If you have a medical emergency, please immediately call 911 or go to the emergency department.  Pager Numbers  - Dr. Bary Likes: [phone]  -  Dr. Annette Barters: [phone]  - Dr. Felipe Horton: [phone]   In the event of inclement weather, please call our main line at [phone] for an update on the status of any delays or closures.  Dermatology Medication Tips: Please keep the boxes that topical medications come in in order to help keep track of the instructions about where and how to use these. Pharmacies typically print the medication instructions only on the boxes and not directly on the medication tubes.   If your medication is too expensive,  please contact our office at [phone] option 4 or send us  a message through MyChart.   We are unable to tell what your co-pay for medications will be in advance as this is different depending on your insurance coverage. However, we may be able to find a substitute medication at lower cost or fill out paperwork to get insurance to cover a needed medication.   If a prior authorization is required to get your medication covered by your insurance company, please allow us  1-2 business days to complete this process.  Drug prices often vary depending on where the prescription is filled and some pharmacies may offer cheaper prices.  The website www.goodrx.com contains coupons for medications through different pharmacies. The prices here do not account for what the cost may be with help from insurance (it may be cheaper with your insurance), but the website can give you the price if you did not use any insurance.  - You can print the associated coupon and take it with your prescription to the pharmacy.  - You may also stop by our office during regular business hours and pick up a GoodRx coupon card.  - If you need your prescription sent electronically to a different pharmacy, notify our office through [facility] or by phone at [phone] option 4.     Si Usted Necesita Algo Despus de Su Visita  Tambin puede enviarnos un mensaje a travs de Clinical cytogeneticist. Por lo general respondemos a los mensajes de MyChart en el transcurso de 1 a 2 das hbiles.  Para renovar recetas, por favor pida a su farmacia que se ponga en contacto con nuestra oficina. Franz Jacks de fax es [place] [phone].  Si tiene un asunto urgente cuando la clnica est cerrada y que no puede esperar hasta el siguiente da hbil, puede llamar/localizar a su doctor(a) al nmero que aparece a continuacin.   Por favor, tenga en cuenta que aunque hacemos todo lo posible para estar disponibles para asuntos urgentes fuera del  horario de [place], no estamos disponibles las 24 horas del da, los 7 [address]  [address] de la [place].   Si tiene un problema urgente y no puede comunicarse con nosotros, puede optar por buscar atencin mdica  en el consultorio de su doctor(a), en una clnica privada, en un centro de atencin urgente o en una sala de emergencias.  Si tiene Engineer, drilling, por favor llame inmediatamente al 911 o vaya a la sala de emergencias.  Nmeros de bper  - Dr. Bary Likes: [phone]  - Dra. Annette Barters: [phone]  - Dr. Felipe Horton: [phone]   En caso de inclemencias del tiempo, por favor llame a Lajuan Pila principal al [phone] para una actualizacin sobre el [place] de cualquier retraso o cierre.  Consejos para la medicacin en dermatologa: Por favor, guarde las cajas en las que vienen los medicamentos de uso tpico para ayudarle a seguir las instrucciones sobre dnde y cmo usarlos. Las farmacias generalmente imprimen las instrucciones del medicamento slo en las  cajas y no directamente en los tubos del medicamento.   Si su medicamento es muy caro, por favor, pngase en contacto con Bettyjane Brunet llamando al [phone] y presione la opcin 4 o envenos un mensaje a travs de Clinical cytogeneticist.   No podemos decirle cul ser su copago por los medicamentos por adelantado ya que esto es diferente dependiendo de la cobertura de su seguro. Sin embargo, es posible que podamos encontrar un medicamento sustituto a Audiological scientist un formulario para que el seguro cubra el medicamento que se considera necesario.   Si se requiere una autorizacin previa para que su compaa de seguros [place] su medicamento, por favor permtanos de 1 a 2 das hbiles para completar este proceso.  Los precios de los medicamentos varan con frecuencia dependiendo del Environmental consultant de dnde se surte la receta y alguna farmacias pueden ofrecer precios ms baratos.  El sitio web www.goodrx.com tiene cupones para medicamentos de Engineer, civil (consulting). Los precios aqu no tienen en cuenta lo que podra costar con la ayuda del seguro (puede ser ms barato con su seguro), pero el sitio web puede darle el precio si no utiliz Tourist information centre manager.  - Puede imprimir el cupn correspondiente y llevarlo con su receta a la farmacia.  - Tambin puede pasar por nuestra oficina durante el horario de atencin regular y Education officer, museum una tarjeta de cupones de GoodRx.  - Si necesita que su receta se enve electrnicamente a una farmacia diferente, informe a nuestra oficina a travs de MyChart de  o por telfono llamando al [phone] y presione la opcin 4.

## 2023-12-25 ENCOUNTER — Ambulatory Visit: Payer: Self-pay | Admitting: Dermatology

## 2023-12-25 LAB — SURGICAL PATHOLOGY

## 2023-12-26 ENCOUNTER — Encounter: Payer: Self-pay | Admitting: Dermatology

## 2023-12-26 NOTE — Telephone Encounter (Signed)
 Left voicemail to return my call

## 2023-12-26 NOTE — Telephone Encounter (Signed)
 Patient left message on nurse VM to leave details on her VM.   Left detailed message with biopsy result. aw

## 2023-12-26 NOTE — Telephone Encounter (Signed)
-----   Message from Celine Collard sent at 12/25/2023  5:48 PM EDT ----- FINAL DIAGNOSIS        1. Skin, R mid back at bra line :       DYSPLASTIC COMPOUND NEVUS WITH MODERATE ATYPIA, LIMITED MARGINS FREE   Moderate dysplastic Recheck next visit ----- Message ----- From: Interface, Lab In Three Zero One Sent: 12/25/2023   5:33 PM EDT To: Elta Halter, MD

## 2024-01-10 ENCOUNTER — Ambulatory Visit: Admitting: Internal Medicine

## 2024-01-16 DIAGNOSIS — M7022 Olecranon bursitis, left elbow: Secondary | ICD-10-CM | POA: Diagnosis not present

## 2024-01-16 DIAGNOSIS — M7062 Trochanteric bursitis, left hip: Secondary | ICD-10-CM | POA: Diagnosis not present

## 2024-01-18 ENCOUNTER — Encounter: Payer: Self-pay | Admitting: Internal Medicine

## 2024-01-19 MED ORDER — ZEPBOUND 15 MG/0.5ML ~~LOC~~ SOLN: 15.0000 mg | SUBCUTANEOUS | 1 refills | Status: DC

## 2024-01-19 MED ORDER — TIRZEPATIDE-WEIGHT MANAGEMENT 15 MG/0.5ML ~~LOC~~ SOAJ: 15.0000 mg | SUBCUTANEOUS | 2 refills | Status: DC

## 2024-01-23 MED ORDER — ZEPBOUND 15 MG/0.5ML ~~LOC~~ SOLN: 15.0000 mg | SUBCUTANEOUS | 1 refills | Status: DC

## 2024-01-23 NOTE — Addendum Note (Signed)
 Addended by: HARRIETTE RAISIN on: 01/23/2024 03:49 PM   Modules accepted: Orders

## 2024-01-24 ENCOUNTER — Other Ambulatory Visit: Payer: Self-pay

## 2024-02-15 ENCOUNTER — Other Ambulatory Visit: Payer: Self-pay | Admitting: Internal Medicine

## 2024-03-09 ENCOUNTER — Other Ambulatory Visit: Payer: Self-pay | Admitting: Internal Medicine

## 2024-03-20 ENCOUNTER — Other Ambulatory Visit: Payer: Self-pay | Admitting: Internal Medicine

## 2024-03-30 ENCOUNTER — Encounter: Payer: Self-pay | Admitting: Internal Medicine

## 2024-04-01 ENCOUNTER — Telehealth: Payer: Self-pay

## 2024-04-01 MED ORDER — ZEPBOUND 15 MG/0.5ML ~~LOC~~ SOLN: 15.0000 mg | SUBCUTANEOUS | 0 refills | Status: DC

## 2024-04-01 NOTE — Telephone Encounter (Signed)
 Medication has been refilled.

## 2024-04-01 NOTE — Addendum Note (Signed)
 Addended by: HARRIETTE RAISIN on: 04/01/2024 02:19 PM   Modules accepted: Orders

## 2024-04-01 NOTE — Telephone Encounter (Signed)
 Copied from CRM [id number]. Topic: Clinical - Medication Question >> Apr 01, 2024  1:12 PM Aisha D wrote: Reason for CRM: Pt stated that she sent a message regarding a refill for the tirzepatide  (ZEPBOUND ) 15 MG/0.5ML Pen but hasn't received a response. Pt stated that she has to take this medication by a certain day or she would have to start over. Pt needs the medication sent to [facility] and would like a callback with an update.

## 2024-04-10 ENCOUNTER — Other Ambulatory Visit: Payer: Self-pay | Admitting: Internal Medicine

## 2024-04-23 MED ORDER — TIRZEPATIDE-WEIGHT MANAGEMENT 15 MG/0.5ML ~~LOC~~ SOAJ: 15.0000 mg | SUBCUTANEOUS | 2 refills | Status: DC

## 2024-04-26 ENCOUNTER — Telehealth: Payer: Self-pay

## 2024-04-26 ENCOUNTER — Other Ambulatory Visit: Payer: Self-pay

## 2024-04-26 MED ORDER — ZEPBOUND 15 MG/0.5ML ~~LOC~~ SOLN: 15.0000 mg | SUBCUTANEOUS | 3 refills | Status: DC

## 2024-04-26 NOTE — Telephone Encounter (Signed)
 Copied from CRM [id number]. Topic: Clinical - Prescription Issue >> Apr 26, 2024  8:27 AM Mia F wrote: Reason for CRM: Pt says the pharmacy is having an issue with the rx request for tirzepatide  (ZEPBOUND ) 15 MG/0.5ML Pen again. She could not explain what the issue was but says CMA Harlene knows how to fix it as she has before. Please advise

## 2024-04-26 NOTE — Addendum Note (Signed)
 Addended by: HARRIETTE RAISIN on: 04/26/2024 04:13 PM   Modules accepted: Orders

## 2024-04-26 NOTE — Telephone Encounter (Signed)
 Spoke with pt to let her know that I have resent the rx as the vial. Pt gave a verbal understanding.

## 2024-04-26 NOTE — Telephone Encounter (Signed)
 See telephone encounter. Rx has been corrected.

## 2024-05-01 ENCOUNTER — Other Ambulatory Visit: Payer: Self-pay | Admitting: Internal Medicine

## 2024-05-06 ENCOUNTER — Ambulatory Visit (INDEPENDENT_AMBULATORY_CARE_PROVIDER_SITE_OTHER): Admitting: Internal Medicine

## 2024-05-06 ENCOUNTER — Encounter: Payer: Self-pay | Admitting: Internal Medicine

## 2024-05-06 VITALS — BP 122/70 | HR 64 | Ht 66.0 in | Wt 158.0 lb

## 2024-05-06 DIAGNOSIS — E663 Overweight: Secondary | ICD-10-CM

## 2024-05-06 DIAGNOSIS — R7303 Prediabetes: Secondary | ICD-10-CM | POA: Diagnosis not present

## 2024-05-06 DIAGNOSIS — E7800 Pure hypercholesterolemia, unspecified: Secondary | ICD-10-CM

## 2024-05-06 DIAGNOSIS — H6593 Unspecified nonsuppurative otitis media, bilateral: Secondary | ICD-10-CM | POA: Insufficient documentation

## 2024-05-06 DIAGNOSIS — E032 Hypothyroidism due to medicaments and other exogenous substances: Secondary | ICD-10-CM

## 2024-05-06 DIAGNOSIS — R5383 Other fatigue: Secondary | ICD-10-CM

## 2024-05-06 MED ORDER — PREDNISONE 10 MG PO TABS: ORAL_TABLET | ORAL | 0 refills | Status: AC

## 2024-05-06 MED ORDER — AMOXICILLIN-POT CLAVULANATE 875-125 MG PO TABS: 1.0000 | ORAL_TABLET | Freq: Two times a day (BID) | ORAL | 0 refills | Status: AC

## 2024-05-06 NOTE — Assessment & Plan Note (Signed)
 has done well using GLP1 agonist therapy since 2022 .  She is on the maximal dose of Zepbound  and has reduced her BMI to 25  with a 22 lb weight loss .Emily Arellano  No changes today

## 2024-05-06 NOTE — Progress Notes (Signed)
 Subjective:  Patient ID: Emily Arellano, female    DOB: 08/09/1955  Age: 68 y.o. MRN: [id number]  CC: The primary encounter diagnosis was Otitis media with effusion, bilateral. Diagnoses of Iatrogenic hypothyroidism, Prediabetes, Pure hypercholesterolemia, Other fatigue, and Overweight (BMI 25.0-29.9) were also pertinent to this visit.   HPI LORALEI RADCLIFFE presents for  Chief Complaint  Patient presents with   Medical Management of Chronic Issues    6 month follow up    1) muffled hearing: Patient reports a persistent loss of hearing (like my ears are under water) for 6 weeks,  following an episode of acute sinusitis .  The infection occurred during international travel and SHE TETED POSITIVE FOR COVID.  She recalls having purulent sputum and nasal discharge and congestion..  did not take antibiotics  2) obesity;  has lost 22 lbs using Zepbound  through [employer] order.  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 75 mL 12   celecoxib  (CELEBREX ) 200 MG capsule TAKE 1 CAPSULE BY MOUTH TWICE DAILY 60 capsule 2   Cholecalciferol (VITAMIN D3) 2000 UNITS capsule Take by mouth.     cyanocobalamin  (VITAMIN B12) 1000 MCG/ML injection INJECT 1 MLS INTO THE MUSCLE ONCE A WEEK. 4 mL 2   cyclobenzaprine  (FLEXERIL ) 10 MG tablet Take 1 tablet (10 mg total) by mouth 3 (three) times daily. 270 tablet 1   estradiol  (ESTRACE  VAGINAL) 0.1 MG/GM vaginal cream Place 1 g vaginally at bedtime. For 2 weeks,  then twice weekly thereafter 42.5 g 12   fluticasone  (FLONASE ) 50 MCG/ACT nasal spray USE ONE SPRAY INTO BOTH NOSTRILS EVERY DAY 16 g 2   levothyroxine  (SYNTHROID ) 88 MCG tablet TAKE 1 TABLET EVERY DAY ON  EMPTY STOMACHWITH A GLASS OF WATER AT LEAST 30-60 MINBEFORE BREAKFAST 30 tablet 0   MAGNESIUM PO Take 3-4 tablets by mouth at bedtime.     ondansetron  (ZOFRAN ) 4 MG tablet TAKE 1 TABLET BY MOUTH EVERY 8 HOURS AS NEEDED FOR NAUSEA OR VOMITING 30 tablet 2   pantoprazole  (PROTONIX ) 40 MG tablet Take 1 tablet (40 mg total) by mouth daily. 90 tablet 3   temazepam  (RESTORIL ) 30 MG capsule TAKE 1 CAPSULE (30 MG) BY MOUTH AT BEDTIME AS NEEDED FOR SLEEP 30 capsule 5   Tirzepatide -Weight Management (ZEPBOUND ) 15 MG/0.5ML SOLN Inject 15 mg into the skin once a week. 2 mL 3   tretinoin  (RETIN-A ) 0.05 % cream Apply a thin coat to face QHS. 45 g 6   triamcinolone  (KENALOG ) 0.147 MG/GM topical spray Apply to aa's bug bites BID PRN. Avoid applying to face, groin, and axilla. Use as directed. Long-term use can cause thinning of the skin. 63 g 1   triamcinolone  cream (KENALOG ) 0.1 % Apply 1 Application topically 2 (two) times daily. Friday, Saturday and Sunday only. Avoid face, groin and underarms. 45 g 0   No facility-administered medications prior to visit.    Review of Systems;  Patient denies headache, fevers, malaise, unintentional weight loss, skin rash, eye pain, sinus congestion and sinus pain, sore throat, dysphagia,  hemoptysis , cough, dyspnea, wheezing, chest pain, palpitations, orthopnea, edema, abdominal pain, nausea, melena, diarrhea, constipation, flank pain, dysuria,  hematuria, urinary  Frequency, nocturia, numbness, tingling, seizures,  Focal weakness, Loss of consciousness,  Tremor, insomnia, depression, anxiety, and suicidal ideation.      Objective:  BP 122/70   Pulse 64   Ht 5' 6 (1.676 m)   Wt 158 lb (71.7 kg)   SpO2 98%   BMI 25.50 kg/m   BP Readings from Last 3 Encounters:  05/06/24 122/70  12/07/23 112/68  11/03/23 122/64    Wt Readings from Last 3 Encounters:  05/06/24 158 lb (71.7 kg)  12/07/23 164 lb (74.4 kg)  11/03/23 166 lb 6.4 oz (75.5 kg)    Physical  Exam Vitals reviewed.  Constitutional:      General: She is not in acute distress.    Appearance: Normal appearance. She is normal weight. She is not ill-appearing, toxic-appearing or diaphoretic.  HENT:     Head: Normocephalic.     Ears:     Comments: Bilateral effusions , no erythema  Eyes:     General: No scleral icterus.       Right eye: No discharge.        Left eye: No discharge.     Conjunctiva/sclera: Conjunctivae normal.  Cardiovascular:     Rate and Rhythm: Normal rate and regular rhythm.     Heart sounds: Normal heart sounds.  Pulmonary:     Effort: Pulmonary effort is normal. No respiratory distress.     Breath sounds: Normal breath sounds.  Musculoskeletal:        General: Normal range of motion.  Skin:    General: Skin is warm and dry.  Neurological:     General: No focal deficit present.     Mental Status: She is alert and oriented to person, place, and time. Mental status is at baseline.  Psychiatric:        Mood and Affect: Mood normal.        Behavior: Behavior normal.        Thought Content: Thought content normal.        Judgment: Judgment normal.     Lab Results  Component Value Date   HGBA1C 5.8 11/03/2023   HGBA1C 5.7 09/02/2022   HGBA1C 5.8 04/01/2022    Lab Results  Component Value Date   CREATININE 0.87 11/03/2023   CREATININE 0.82 01/04/2023   CREATININE 0.90 09/02/2022    Lab Results  Component Value Date   WBC 4.7 11/03/2023   HGB 13.8 11/03/2023   HCT 41.3 11/03/2023   PLT 364.0 11/03/2023   GLUCOSE 98 11/03/2023   CHOL 215 (H) 11/03/2023   TRIG 116.0 11/03/2023   HDL 62.00 11/03/2023   LDLDIRECT 140.0 11/03/2023   LDLCALC 130 (H) 11/03/2023   ALT 19 11/03/2023   AST 18 11/03/2023   NA 138 11/03/2023   K 4.2 11/03/2023   CL 102 11/03/2023   CREATININE 0.87 11/03/2023   BUN 15 11/03/2023   CO2 28 11/03/2023   TSH 2.15 11/03/2023   HGBA1C 5.8 11/03/2023    MM 3D SCREENING MAMMOGRAM BILATERAL BREAST Result Date:  10/17/2023 CLINICAL DATA:  Screening. EXAM: DIGITAL SCREENING BILATERAL MAMMOGRAM WITH TOMOSYNTHESIS AND CAD TECHNIQUE: Bilateral screening digital craniocaudal and mediolateral oblique mammograms were obtained. Bilateral screening digital breast tomosynthesis was performed. The images were evaluated with computer-aided detection. COMPARISON:  Previous exam(s). ACR Breast Density Category b: There are scattered areas of fibroglandular density. FINDINGS: There are no findings suspicious for malignancy. IMPRESSION: No mammographic evidence of malignancy. A result letter of this screening  mammogram will be mailed directly to the patient. RECOMMENDATION: Screening mammogram in one year. (Code:SM-B-01Y) BI-RADS CATEGORY  1: Negative. Electronically Signed   By: Dirk Arrant M.D.   On: 10/17/2023 16:51    Assessment & Plan:  .Otitis media with effusion, bilateral Assessment & Plan: [facility] to prolonged sinus congestion following a COVID infection in late August.    Rx augmentin  ,  prednisone taper and Afrin x 5 days.  Follow up with ENT if no resolution    Iatrogenic hypothyroidism -     TSH  Prediabetes -     Hemoglobin A1c -     Comprehensive metabolic panel with GFR  Pure hypercholesterolemia -     LDL cholesterol, direct -     Lipid panel  Other fatigue -     CBC with Differential/Platelet  Overweight (BMI 25.0-29.9) Assessment & Plan: has done well using GLP1 agonist therapy since 2022 .  She is on the maximal dose of Zepbound  and has reduced her BMI to 25  with a 22 lb weight loss .SABRA  No changes today    Other orders -     Amoxicillin -Pot Clavulanate; Take 1 tablet by mouth 2 (two) times daily.  Dispense: 14 tablet; Refill: 0 -     predniSONE; 6 tablets on Day 1 , then reduce by 1 tablet daily until gone  Dispense: 21 tablet; Refill: 0     I spent 34 minutes on the day of this face to face encounter reviewing patient's  most recent visit with cardiology,  nephrology,  and  neurology,  prior relevant surgical and non surgical procedures, recent  labs and imaging studies, counseling on weight management,  reviewing the assessment and plan with patient, and post visit ordering and reviewing of  diagnostics and therapeutics with patient  .   Follow-up: No follow-ups on file.   Verneita LITTIE Kettering, MD

## 2024-05-06 NOTE — Assessment & Plan Note (Signed)
 [facility] to prolonged sinus congestion following a COVID infection in late August.    Rx augmentin  ,  prednisone taper and Afrin x 5 days.  Follow up with ENT if no resolution

## 2024-06-03 ENCOUNTER — Other Ambulatory Visit: Payer: Self-pay | Admitting: Internal Medicine

## 2024-06-14 ENCOUNTER — Other Ambulatory Visit: Payer: Self-pay | Admitting: Internal Medicine

## 2024-07-25 ENCOUNTER — Other Ambulatory Visit: Payer: Self-pay | Admitting: Internal Medicine

## 2024-07-26 ENCOUNTER — Other Ambulatory Visit: Payer: Self-pay

## 2024-07-26 ENCOUNTER — Encounter: Payer: Self-pay | Admitting: Internal Medicine

## 2024-07-30 MED ORDER — ZEPBOUND 15 MG/0.5ML ~~LOC~~ SOLN: 15.0000 mg | SUBCUTANEOUS | 3 refills | Status: AC

## 2024-08-16 ENCOUNTER — Other Ambulatory Visit: Payer: Self-pay | Admitting: Internal Medicine

## 2024-11-04 ENCOUNTER — Ambulatory Visit: Admitting: Internal Medicine

## 2024-12-26 ENCOUNTER — Ambulatory Visit: Admitting: Dermatology
